# Patient Record
Sex: Female | Born: 1950 | Race: White | Hispanic: No | Smoking: Former smoker
Health system: Southern US, Community
[De-identification: ages and names within clinical notes are randomized; demographics above are authoritative.]

## PROBLEM LIST (undated history)

## (undated) DIAGNOSIS — I509 Heart failure, unspecified: Secondary | ICD-10-CM

## (undated) DIAGNOSIS — H269 Unspecified cataract: Secondary | ICD-10-CM

## (undated) DIAGNOSIS — E785 Hyperlipidemia, unspecified: Secondary | ICD-10-CM

## (undated) DIAGNOSIS — I10 Essential (primary) hypertension: Secondary | ICD-10-CM

## (undated) DIAGNOSIS — I2510 Atherosclerotic heart disease of native coronary artery without angina pectoris: Secondary | ICD-10-CM

## (undated) DIAGNOSIS — F329 Major depressive disorder, single episode, unspecified: Secondary | ICD-10-CM

## (undated) DIAGNOSIS — F32A Depression, unspecified: Secondary | ICD-10-CM

## (undated) DIAGNOSIS — I219 Acute myocardial infarction, unspecified: Secondary | ICD-10-CM

## (undated) DIAGNOSIS — F419 Anxiety disorder, unspecified: Secondary | ICD-10-CM

## (undated) DIAGNOSIS — M1990 Unspecified osteoarthritis, unspecified site: Secondary | ICD-10-CM

## (undated) HISTORY — PX: ABDOMINAL HYSTERECTOMY: SHX81

## (undated) HISTORY — DX: Major depressive disorder, single episode, unspecified: F32.9

## (undated) HISTORY — DX: Anxiety disorder, unspecified: F41.9

## (undated) HISTORY — DX: Depression, unspecified: F32.A

## (undated) HISTORY — PX: RIB FRACTURE SURGERY: SHX2358

## (undated) HISTORY — PX: FRACTURE SURGERY: SHX138

## (undated) HISTORY — DX: Hyperlipidemia, unspecified: E78.5

## (undated) HISTORY — DX: Essential (primary) hypertension: I10

## (undated) HISTORY — PX: CATARACT EXTRACTION, BILATERAL: SHX1313

## (undated) HISTORY — DX: Heart failure, unspecified: I50.9

## (undated) HISTORY — DX: Unspecified cataract: H26.9

---

## 1971-01-23 HISTORY — PX: BUNIONECTOMY: SHX129

## 1975-01-23 HISTORY — PX: WRIST FRACTURE SURGERY: SHX121

## 1975-01-23 HISTORY — PX: ELBOW SURGERY: SHX618

## 1990-01-22 HISTORY — PX: HAMMER TOE SURGERY: SHX385

## 2004-07-18 DIAGNOSIS — I252 Old myocardial infarction: Secondary | ICD-10-CM | POA: Insufficient documentation

## 2004-08-15 ENCOUNTER — Ambulatory Visit: Payer: Self-pay | Admitting: Podiatry

## 2004-09-28 ENCOUNTER — Ambulatory Visit: Payer: Self-pay | Admitting: Podiatry

## 2007-01-23 DIAGNOSIS — I219 Acute myocardial infarction, unspecified: Secondary | ICD-10-CM

## 2007-01-23 HISTORY — DX: Acute myocardial infarction, unspecified: I21.9

## 2007-01-23 HISTORY — PX: CORONARY STENT PLACEMENT: SHX1402

## 2007-07-27 ENCOUNTER — Inpatient Hospital Stay: Payer: Self-pay | Admitting: Internal Medicine

## 2007-07-27 ENCOUNTER — Other Ambulatory Visit: Payer: Self-pay

## 2007-07-28 ENCOUNTER — Other Ambulatory Visit: Payer: Self-pay

## 2009-02-04 DIAGNOSIS — I10 Essential (primary) hypertension: Secondary | ICD-10-CM

## 2013-09-10 LAB — HEPATIC FUNCTION PANEL
ALT: 14 U/L (ref 7–35)
AST: 19 U/L (ref 13–35)
Alkaline Phosphatase: 62 U/L (ref 25–125)
Bilirubin, Total: 0.2 mg/dL

## 2013-09-10 LAB — LIPID PANEL
CHOLESTEROL: 162 mg/dL (ref 0–200)
HDL: 64 mg/dL (ref 35–70)
LDL Cholesterol: 78 mg/dL
LDl/HDL Ratio: 1.2
Triglycerides: 102 mg/dL (ref 40–160)

## 2013-09-10 LAB — BASIC METABOLIC PANEL
BUN: 9 mg/dL (ref 4–21)
CREATININE: 0.9 mg/dL (ref 0.5–1.1)
Glucose: 121 mg/dL
Potassium: 4.8 mmol/L (ref 3.4–5.3)
Sodium: 139 mmol/L (ref 137–147)

## 2013-09-10 LAB — HEMOGLOBIN A1C: Hgb A1c MFr Bld: 5.9 % (ref 4.0–6.0)

## 2014-07-01 ENCOUNTER — Other Ambulatory Visit: Payer: Self-pay

## 2014-07-01 MED ORDER — BUPROPION HCL ER (XL) 300 MG PO TB24: 300.0000 mg | ORAL_TABLET | Freq: Every day | ORAL | Status: DC

## 2014-07-01 MED ORDER — CARVEDILOL 12.5 MG PO TABS: 12.5000 mg | ORAL_TABLET | Freq: Two times a day (BID) | ORAL | Status: DC

## 2014-07-19 ENCOUNTER — Encounter: Payer: Self-pay | Admitting: Family Medicine

## 2014-07-19 DIAGNOSIS — M545 Low back pain, unspecified: Secondary | ICD-10-CM | POA: Insufficient documentation

## 2014-07-19 DIAGNOSIS — E785 Hyperlipidemia, unspecified: Secondary | ICD-10-CM | POA: Insufficient documentation

## 2014-07-19 DIAGNOSIS — E8881 Metabolic syndrome: Secondary | ICD-10-CM | POA: Insufficient documentation

## 2014-07-19 DIAGNOSIS — F320 Major depressive disorder, single episode, mild: Secondary | ICD-10-CM | POA: Insufficient documentation

## 2014-07-19 DIAGNOSIS — I2510 Atherosclerotic heart disease of native coronary artery without angina pectoris: Secondary | ICD-10-CM | POA: Insufficient documentation

## 2014-07-19 DIAGNOSIS — G5601 Carpal tunnel syndrome, right upper limb: Secondary | ICD-10-CM | POA: Insufficient documentation

## 2014-07-19 DIAGNOSIS — K649 Unspecified hemorrhoids: Secondary | ICD-10-CM | POA: Insufficient documentation

## 2014-07-19 DIAGNOSIS — G8929 Other chronic pain: Secondary | ICD-10-CM | POA: Insufficient documentation

## 2014-07-19 DIAGNOSIS — G4700 Insomnia, unspecified: Secondary | ICD-10-CM | POA: Insufficient documentation

## 2014-07-19 DIAGNOSIS — M8580 Other specified disorders of bone density and structure, unspecified site: Secondary | ICD-10-CM | POA: Insufficient documentation

## 2014-07-19 DIAGNOSIS — J309 Allergic rhinitis, unspecified: Secondary | ICD-10-CM | POA: Insufficient documentation

## 2014-07-19 DIAGNOSIS — G4709 Other insomnia: Secondary | ICD-10-CM | POA: Insufficient documentation

## 2014-07-22 ENCOUNTER — Encounter: Payer: Self-pay | Admitting: Family Medicine

## 2014-08-09 ENCOUNTER — Encounter: Payer: Self-pay | Admitting: Family Medicine

## 2014-08-09 ENCOUNTER — Ambulatory Visit (INDEPENDENT_AMBULATORY_CARE_PROVIDER_SITE_OTHER): Payer: BLUE CROSS/BLUE SHIELD | Admitting: Family Medicine

## 2014-08-09 VITALS — BP 122/78 | HR 105 | Temp 98.4°F | Resp 14 | Ht 62.0 in | Wt 141.7 lb

## 2014-08-09 DIAGNOSIS — I2510 Atherosclerotic heart disease of native coronary artery without angina pectoris: Secondary | ICD-10-CM | POA: Diagnosis not present

## 2014-08-09 DIAGNOSIS — E785 Hyperlipidemia, unspecified: Secondary | ICD-10-CM | POA: Diagnosis not present

## 2014-08-09 DIAGNOSIS — F329 Major depressive disorder, single episode, unspecified: Secondary | ICD-10-CM | POA: Diagnosis not present

## 2014-08-09 DIAGNOSIS — Z1239 Encounter for other screening for malignant neoplasm of breast: Secondary | ICD-10-CM

## 2014-08-09 DIAGNOSIS — M545 Low back pain, unspecified: Secondary | ICD-10-CM

## 2014-08-09 DIAGNOSIS — I10 Essential (primary) hypertension: Secondary | ICD-10-CM | POA: Diagnosis not present

## 2014-08-09 DIAGNOSIS — G8929 Other chronic pain: Secondary | ICD-10-CM

## 2014-08-09 DIAGNOSIS — F32A Depression, unspecified: Secondary | ICD-10-CM

## 2014-08-09 MED ORDER — ROSUVASTATIN CALCIUM 40 MG PO TABS: 40.0000 mg | ORAL_TABLET | Freq: Every day | ORAL | Status: DC

## 2014-08-09 MED ORDER — LISINOPRIL 20 MG PO TABS: 20.0000 mg | ORAL_TABLET | Freq: Every day | ORAL | Status: DC

## 2014-08-09 MED ORDER — LISINOPRIL 20 MG PO TABS: 20.0000 mg | ORAL_TABLET | Freq: Two times a day (BID) | ORAL | Status: DC

## 2014-08-09 MED ORDER — CYCLOBENZAPRINE HCL 10 MG PO TABS: 10.0000 mg | ORAL_TABLET | Freq: Every evening | ORAL | Status: DC | PRN

## 2014-08-09 NOTE — Progress Notes (Signed)
Name: Elizabeth Johnson   MRN: [id number]    DOB: 05-Sep-1950   Date:08/09/2014       Progress Note  Subjective  Chief Complaint  Chief Complaint  Patient presents with  . Hypertension  . Hyperlipidemia  . Depression  . Anxiety    prn    HPI  HTN: she has ben taking Lisinopril and Coreg daily , she denies a cough or fatigue. She states for the past month she has only been taking Lisinopril once daily. BP at home has been at goal  Hyperlipidemia: taking Crestor daily and denies side effects  CAD: s/p stent placement for Acute MI back in 2006, she denies chest pain, palpitation, no decrease in exercise tolerance, no orthopnea.  Depression: she is not in remission, feels blah most of the time, taking Wellbutrin daily but does not want to change or add medications  Intermittent back pain: currently not daily, taking Flexeril prn . Pain is described as a aching sensation on lower back , seldom it radiates down left leg. It does not affect her ADL or ability to work, improves with rest and Flexeril  Patient Active Problem List   Diagnosis Date Noted  . Allergic rhinitis 07/19/2014  . Insomnia, persistent 07/19/2014  . Chronic LBP 07/19/2014  . Clinical depression 07/19/2014  . Dyslipidemia 07/19/2014  . Hemorrhoid 07/19/2014  . H/O acute myocardial infarction 07/19/2014  . Dysmetabolic syndrome 38/75/6433  . 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 Osteopenia 07/19/2014  . Benign essential HTN 02/04/2009    Past Surgical History  Procedure Laterality Date  . Coronary stent placement  2009  . Bunionectomy Bilateral   . Elbow surgery Left   . Wrist fracture surgery    . Abdominal hysterectomy    . Rib fracture surgery    . Hammer toe surgery      Family History  Problem Relation Age of Onset  . CAD Father   . Heart attack Father   . Diabetes Sister   . Heart attack Brother   . Alzheimer's disease Mother   . Breast cancer Mother      History   Social History  . Marital Status: Married    Spouse Name: N/A  . Number of Children: 1  . Years of Education: N/A   Occupational History  . Supervisor    Social History Main Topics  . Smoking status: Former Smoker    Types: Cigarettes    Quit date: 05/23/2007  . Smokeless tobacco: Never Used  . Alcohol Use: 0.0 oz/week    0 Standard drinks or equivalent per week     Comment: occasional  . Drug Use: No  . Sexual Activity: Yes   Other Topics Concern  . Not on file   Social History Narrative     Current outpatient prescriptions:  .  ALPRAZolam (XANAX) 0.5 MG tablet, Take 1 tablet by mouth as needed., Disp: , Rfl:  .  aspirin 81 MG chewable tablet, Chew 1 tablet by mouth daily., Disp: , Rfl:  .  buPROPion (WELLBUTRIN XL) 300 MG 24 hr tablet, Take 1 tablet (300 mg total) by mouth daily., Disp: 90 tablet, Rfl: 0 .  carvedilol (COREG) 12.5 MG tablet, Take 1 tablet (12.5 mg total) by mouth 2 (two) times daily with a meal., Disp: 180 tablet, Rfl: 0 .  cyclobenzaprine (FLEXERIL) 10 MG tablet, Take 1 tablet (10 mg total) by mouth at bedtime as needed., Disp: 90 tablet, Rfl: 1 .  lisinopril (PRINIVIL,ZESTRIL) 20 MG tablet, Take 1 tablet (20 mg total) by mouth daily., Disp: 90 tablet, Rfl: 1 .  rosuvastatin (CRESTOR) 40 MG tablet, Take 1 tablet (40 mg total) by mouth daily., Disp: 90 tablet, Rfl: 1  Allergies  Allergen Reactions  . Codeine      ROS  Constitutional: Negative for fever or weight change.  Respiratory: Negative for cough and shortness of breath.   Cardiovascular: Negative for chest pain or palpitations.  Gastrointestinal: Negative for abdominal pain, no bowel changes.  Musculoskeletal: Negative for gait problem or joint swelling.  Skin: Negative for rash.  Neurological: Negative for dizziness or headache.  No other specific complaints in a complete review of systems (except as listed in HPI above).  Objective  Filed Vitals:   08/09/14 1535   BP: 122/78  Pulse: 105  Temp: 98.4 F (36.9 C)  TempSrc: Oral  Resp: 14  Height: 5\' 2"  (1.575 m)  Weight: 141 lb 11.2 oz (64.275 kg)  SpO2: 96%    Body mass index is 25.91 kg/(m^2).  Physical Exam  Constitutional: Patient appears well-developed and well-nourished. No distress.  HENT: Head: Normocephalic and atraumatic. Nose: Nose normal. Mouth/Throat: Oropharynx is clear and moist. No oropharyngeal exudate.  Eyes: Conjunctivae and EOM are normal. Pupils are equal, round, and reactive to light. No scleral icterus.  Neck: Normal range of motion. Neck supple. No JVD present. No thyromegaly present.  Cardiovascular: Normal rate, regular rhythm and normal heart sounds.  No murmur heard. No BLE edema. Pulmonary/Chest: Effort normal and breath sounds normal. No respiratory distress. Abdominal: Soft. Bowel sounds are normal, no distension. There is no tenderness. no masses Musculoskeletal: Normal range of motion, no joint effusions. No gross deformities Neurological: he is alert and oriented to person, place, and time. No cranial nerve deficit. Coordination, balance, strength, speech and gait are normal.  Skin: Skin is warm and dry. No rash noted. No erythema.  Psychiatric: Patient has a normal mood and affect. behavior is normal. Judgment and thought content normal.   PHQ2/9: Depression screen PHQ 2/9 08/09/2014  Decreased Interest 0  Down, Depressed, Hopeless 0  PHQ - 2 Score 0     Fall Risk: Fall Risk  08/09/2014  Falls in the past year? No     Assessment & Plan  1. Benign essential HTN  At goal, continue current medication  - Comprehensive Metabolic Panel (CMET) - lisinopril (PRINIVIL,ZESTRIL) 20 MG tablet; Take 1 tablet (20 mg total) by mouth daily.  Dispense: 90 tablet; Refill: 1  2. Coronary artery disease involving left main coronary artery  Doing well on statin, beta-blocker , ace and aspirin daily, no symptoms  3. Dyslipidemia  - rosuvastatin (CRESTOR) 40 MG  tablet; Take 1 tablet (40 mg total) by mouth daily.  Dispense: 90 tablet; Refill: 1 - Lipid Profile  4. Clinical depression  She still has medication at home  5. Breast cancer screening  - MM Digital Screening; Future  6. Chronic LBP Continue medication - cyclobenzaprine (FLEXERIL) 10 MG tablet; Take 1 tablet (10 mg total) by mouth at bedtime as needed.  Dispense: 90 tablet; Refill: 1

## 2014-08-27 LAB — COMPREHENSIVE METABOLIC PANEL
A/G RATIO: 1.7 (ref 1.1–2.5)
ALK PHOS: 54 IU/L (ref 39–117)
ALT: 15 IU/L (ref 0–32)
AST: 20 IU/L (ref 0–40)
Albumin: 4.3 g/dL (ref 3.6–4.8)
BILIRUBIN TOTAL: 0.3 mg/dL (ref 0.0–1.2)
BUN/Creatinine Ratio: 15 (ref 11–26)
BUN: 12 mg/dL (ref 8–27)
CALCIUM: 9.1 mg/dL (ref 8.7–10.3)
CHLORIDE: 101 mmol/L (ref 97–108)
CO2: 23 mmol/L (ref 18–29)
Creatinine, Ser: 0.81 mg/dL (ref 0.57–1.00)
GFR calc non Af Amer: 78 mL/min/{1.73_m2} (ref >59)
GFR, EST AFRICAN AMERICAN: 90 mL/min/{1.73_m2} (ref >59)
Globulin, Total: 2.5 g/dL (ref 1.5–4.5)
Glucose: 128 mg/dL — ABNORMAL HIGH (ref 65–99)
Potassium: 4.7 mmol/L (ref 3.5–5.2)
Sodium: 141 mmol/L (ref 134–144)
Total Protein: 6.8 g/dL (ref 6.0–8.5)

## 2014-08-27 LAB — LIPID PANEL
CHOLESTEROL TOTAL: 170 mg/dL (ref 100–199)
Chol/HDL Ratio: 2.6 ratio units (ref 0.0–4.4)
HDL: 66 mg/dL (ref >39)
LDL Calculated: 83 mg/dL (ref 0–99)
TRIGLYCERIDES: 106 mg/dL (ref 0–149)
VLDL CHOLESTEROL CAL: 21 mg/dL (ref 5–40)

## 2014-08-30 ENCOUNTER — Telehealth: Payer: Self-pay

## 2014-08-30 NOTE — Telephone Encounter (Signed)
Left vm for patient to return my call regarding labs.   

## 2014-08-30 NOTE — Telephone Encounter (Signed)
-----   Message from Steele Sizer, MD sent at 08/27/2014  6:00 PM EDT ----- Continue statin therapy Glucose is elevated, please add hgbA1C if possible , otherwise repeat fasting on her next visit Normal kidney and liver function Please notify patient, thank you

## 2014-09-02 ENCOUNTER — Ambulatory Visit (INDEPENDENT_AMBULATORY_CARE_PROVIDER_SITE_OTHER): Payer: BLUE CROSS/BLUE SHIELD | Admitting: Family Medicine

## 2014-09-02 ENCOUNTER — Encounter: Payer: Self-pay | Admitting: Family Medicine

## 2014-09-02 VITALS — BP 122/72 | HR 95 | Temp 98.9°F | Resp 16 | Ht 62.0 in | Wt 141.7 lb

## 2014-09-02 DIAGNOSIS — M2041 Other hammer toe(s) (acquired), right foot: Secondary | ICD-10-CM | POA: Diagnosis not present

## 2014-09-02 DIAGNOSIS — K648 Other hemorrhoids: Secondary | ICD-10-CM | POA: Diagnosis not present

## 2014-09-02 DIAGNOSIS — Z719 Counseling, unspecified: Secondary | ICD-10-CM

## 2014-09-02 DIAGNOSIS — Z0000 Encounter for general adult medical examination without abnormal findings: Secondary | ICD-10-CM

## 2014-09-02 DIAGNOSIS — K644 Residual hemorrhoidal skin tags: Secondary | ICD-10-CM | POA: Insufficient documentation

## 2014-09-02 DIAGNOSIS — R739 Hyperglycemia, unspecified: Secondary | ICD-10-CM

## 2014-09-02 DIAGNOSIS — Z7189 Other specified counseling: Secondary | ICD-10-CM

## 2014-09-02 DIAGNOSIS — M2042 Other hammer toe(s) (acquired), left foot: Secondary | ICD-10-CM

## 2014-09-02 DIAGNOSIS — Z1239 Encounter for other screening for malignant neoplasm of breast: Secondary | ICD-10-CM | POA: Diagnosis not present

## 2014-09-02 DIAGNOSIS — Z1211 Encounter for screening for malignant neoplasm of colon: Secondary | ICD-10-CM

## 2014-09-02 DIAGNOSIS — Z90710 Acquired absence of both cervix and uterus: Secondary | ICD-10-CM

## 2014-09-02 DIAGNOSIS — M8580 Other specified disorders of bone density and structure, unspecified site: Secondary | ICD-10-CM

## 2014-09-02 DIAGNOSIS — Z01419 Encounter for gynecological examination (general) (routine) without abnormal findings: Secondary | ICD-10-CM

## 2014-09-02 LAB — POCT GLYCOSYLATED HEMOGLOBIN (HGB A1C): Hemoglobin A1C: 5.8

## 2014-09-02 NOTE — Progress Notes (Signed)
Name: Elizabeth Johnson   MRN: [id number]    DOB: 1950-11-18   Date:09/02/2014       Progress Note  Subjective  Chief Complaint  Chief Complaint  Patient presents with  . Annual Exam    HPI  Well Woman: she denies urinary frequency or urgency, refuses colonoscopy, no need for pap smear - s/p hysterectomy. She has occasional hot flashes.   Hyperglycemia: glucose 128 on her last labs, we will check hgbA1C, denies polyphagia, polydipsia or polyuria.  Patient Active Problem List   Diagnosis Date Noted  . H/O hysterectomy for benign disease 09/02/2014  . Allergic rhinitis 07/19/2014  . Insomnia, persistent 07/19/2014  . Chronic LBP 07/19/2014  . Clinical depression 07/19/2014  . Dyslipidemia 07/19/2014  . Hemorrhoid 07/19/2014  . Dysmetabolic syndrome 84/69/6295  . 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 Osteopenia 07/19/2014  . Benign essential HTN 02/04/2009  . H/O acute myocardial infarction of lateral wall 07/18/2004    Past Surgical History  Procedure Laterality Date  . Coronary stent placement  2009  . Bunionectomy Bilateral   . Elbow surgery Left   . Wrist fracture surgery    . Abdominal hysterectomy    . Rib fracture surgery    . Hammer toe surgery      Family History  Problem Relation Age of Onset  . CAD Father   . Heart attack Father   . Diabetes Sister   . Heart attack Brother   . Alzheimer's disease Mother   . Breast cancer Mother     Social History   Social History  . Marital Status: Married    Spouse Name: N/A  . Number of Children: 1  . Years of Education: N/A   Occupational History  . Supervisor    Social History Main Topics  . Smoking status: Former Smoker    Types: Cigarettes    Quit date: 05/23/2007  . Smokeless tobacco: Never Used  . Alcohol Use: 0.0 oz/week    0 Standard drinks or equivalent per week     Comment: occasional  . Drug Use: No  . Sexual Activity: Yes   Other Topics Concern  . Not  on file   Social History Narrative     Current outpatient prescriptions:  .  ALPRAZolam (XANAX) 0.5 MG tablet, Take 1 tablet by mouth as needed., Disp: , Rfl:  .  aspirin 81 MG chewable tablet, Chew 1 tablet by mouth daily., Disp: , Rfl:  .  buPROPion (WELLBUTRIN XL) 300 MG 24 hr tablet, Take 1 tablet (300 mg total) by mouth daily., Disp: 90 tablet, Rfl: 0 .  carvedilol (COREG) 12.5 MG tablet, Take 1 tablet (12.5 mg total) by mouth 2 (two) times daily with a meal., Disp: 180 tablet, Rfl: 0 .  cyclobenzaprine (FLEXERIL) 10 MG tablet, Take 1 tablet (10 mg total) by mouth at bedtime as needed., Disp: 90 tablet, Rfl: 1 .  lisinopril (PRINIVIL,ZESTRIL) 20 MG tablet, Take 1 tablet (20 mg total) by mouth daily., Disp: 90 tablet, Rfl: 1 .  rosuvastatin (CRESTOR) 40 MG tablet, Take 1 tablet (40 mg total) by mouth daily., Disp: 90 tablet, Rfl: 1  Allergies  Allergen Reactions  . Codeine      ROS  Constitutional: Negative for fever or weight change.  Respiratory: Negative for cough and shortness of breath.   Cardiovascular: Negative for chest pain or palpitations.  Gastrointestinal: Negative for abdominal pain, no bowel changes.  Musculoskeletal: Negative for gait problem  or joint swelling.  Skin: Negative for rash.  Neurological: Negative for dizziness or headache.  No other specific complaints in a complete review of systems (except as listed in HPI above).  Objective  Filed Vitals:   09/02/14 1425  BP: 122/72  Pulse: 95  Temp: 98.9 F (37.2 C)  TempSrc: Oral  Resp: 16  Height: 5\' 2"  (1.575 m)  Weight: 141 lb 11.2 oz (64.275 kg)  SpO2: 94%    Body mass index is 25.91 kg/(m^2).  Physical Exam  Constitutional: Patient appears well-developed and well-nourished. No distress.  HENT: Head: Normocephalic and atraumatic. Ears: B TMs ok, no erythema or effusion; Nose: Nose normal. Mouth/Throat: Oropharynx is clear and moist. No oropharyngeal exudate.  Eyes: Conjunctivae and EOM are  normal. Pupils are equal, round, and reactive to light. No scleral icterus.  Neck: Normal range of motion. Neck supple. No JVD present. No thyromegaly present.  Cardiovascular: Normal rate, regular rhythm and normal heart sounds.  No murmur heard. No BLE edema. Pulmonary/Chest: Effort normal and breath sounds normal. No respiratory distress. Abdominal: Soft. Bowel sounds are normal, no distension. There is no tenderness. no masses Breast: no lumps or masses, no nipple discharge or rashes FEMALE GENITALIA:  External genitalia normal External urethra normal RECTAL: external hemorrhoids Musculoskeletal: Normal range of motion, no joint effusions. Hammer toes both feet Neurological: he is alert and oriented to person, place, and time. No cranial nerve deficit. Coordination, balance, strength, speech and gait are normal.  Skin: Skin is warm and dry. No rash noted. No erythema.  Psychiatric: Patient has a normal mood and affect. behavior is normal. Judgment and thought content normal.  Recent Results (from the past 2160 hour(s))  Lipid Profile     Status: None   Collection Time: 08/26/14  8:17 AM  Result Value Ref Range   Cholesterol, Total 170 100 - 199 mg/dL   Triglycerides 106 0 - 149 mg/dL   HDL 66 >39 mg/dL    Comment: According to ATP-III Guidelines, HDL-C >59 mg/dL is considered a negative risk factor for CHD.    VLDL Cholesterol Cal 21 5 - 40 mg/dL   LDL Calculated 83 0 - 99 mg/dL   Chol/HDL Ratio 2.6 0.0 - 4.4 ratio units    Comment:                                   T. Chol/HDL Ratio                                             Men  Women                               1/2 Avg.Risk  3.4    3.3                                   Avg.Risk  5.0    4.4                                2X Avg.Risk  9.6    7.1  3X Avg.Risk 23.4   11.0   Comprehensive Metabolic Panel (CMET)     Status: Abnormal   Collection Time: 08/26/14  8:17 AM  Result Value Ref Range    Glucose 128 (H) 65 - 99 mg/dL   BUN 12 8 - 27 mg/dL   Creatinine, Ser 0.81 0.57 - 1.00 mg/dL   GFR calc non Af Amer 78 >59 mL/min/1.73   GFR calc Af Amer 90 >59 mL/min/1.73   BUN/Creatinine Ratio 15 11 - 26   Sodium 141 134 - 144 mmol/L   Potassium 4.7 3.5 - 5.2 mmol/L   Chloride 101 97 - 108 mmol/L   CO2 23 18 - 29 mmol/L   Calcium 9.1 8.7 - 10.3 mg/dL   Total Protein 6.8 6.0 - 8.5 g/dL   Albumin 4.3 3.6 - 4.8 g/dL   Globulin, Total 2.5 1.5 - 4.5 g/dL   Albumin/Globulin Ratio 1.7 1.1 - 2.5   Bilirubin Total 0.3 0.0 - 1.2 mg/dL   Alkaline Phosphatase 54 39 - 117 IU/L   AST 20 0 - 40 IU/L   ALT 15 0 - 32 IU/L     PHQ2/9: Depression screen PHQ 2/9 08/09/2014  Decreased Interest 0  Down, Depressed, Hopeless 0  PHQ - 2 Score 0    Fall Risk: Fall Risk  08/09/2014  Falls in the past year? No     Assessment & Plan  1. Well woman exam   2. H/O hysterectomy for benign disease No need for pap   3. Colon cancer screening Refuses colonoscopy , insurance does not cover Cologuard , we will give her hemoccult cards -3 hemoccult cards 4. Breast cancer screening Due for repeat, scheduled in July, she has not been notified of appointment yet  5. Health counseling Discussed importance of 150 minutes of physical activity weekly, eat two servings of fish weekly, eat one serving of tree nuts ( cashews, pistachios, pecans, almonds.Marland Kitchen) every other day, eat 6 servings of fruit/vegetables daily and drink plenty of water and avoid sweet beverages.   6. Osteopenia She refused medication in the past, and does not want to repeat a bone density at this time  7. Hyperglycemia  - POCT HgB A1C

## 2014-09-21 ENCOUNTER — Other Ambulatory Visit: Payer: Self-pay

## 2014-09-21 MED ORDER — BUPROPION HCL ER (XL) 300 MG PO TB24: 300.0000 mg | ORAL_TABLET | Freq: Every day | ORAL | Status: DC

## 2014-09-21 NOTE — Telephone Encounter (Signed)
Patient requesting refill. 

## 2014-09-28 ENCOUNTER — Other Ambulatory Visit: Payer: Self-pay

## 2014-09-28 MED ORDER — CARVEDILOL 12.5 MG PO TABS: 12.5000 mg | ORAL_TABLET | Freq: Two times a day (BID) | ORAL | Status: DC

## 2015-01-06 ENCOUNTER — Encounter: Payer: Self-pay | Admitting: Family Medicine

## 2015-01-06 ENCOUNTER — Ambulatory Visit (INDEPENDENT_AMBULATORY_CARE_PROVIDER_SITE_OTHER): Payer: BLUE CROSS/BLUE SHIELD | Admitting: Family Medicine

## 2015-01-06 VITALS — BP 116/60 | HR 113 | Temp 97.9°F | Resp 14 | Ht 62.0 in | Wt 145.4 lb

## 2015-01-06 DIAGNOSIS — I2510 Atherosclerotic heart disease of native coronary artery without angina pectoris: Secondary | ICD-10-CM | POA: Diagnosis not present

## 2015-01-06 DIAGNOSIS — J069 Acute upper respiratory infection, unspecified: Secondary | ICD-10-CM

## 2015-01-06 DIAGNOSIS — E785 Hyperlipidemia, unspecified: Secondary | ICD-10-CM | POA: Diagnosis not present

## 2015-01-06 DIAGNOSIS — I10 Essential (primary) hypertension: Secondary | ICD-10-CM | POA: Diagnosis not present

## 2015-01-06 DIAGNOSIS — G4700 Insomnia, unspecified: Secondary | ICD-10-CM

## 2015-01-06 DIAGNOSIS — F320 Major depressive disorder, single episode, mild: Secondary | ICD-10-CM | POA: Diagnosis not present

## 2015-01-06 DIAGNOSIS — Z23 Encounter for immunization: Secondary | ICD-10-CM

## 2015-01-06 MED ORDER — LISINOPRIL 20 MG PO TABS
20.0000 mg | ORAL_TABLET | Freq: Every day | ORAL | Status: DC
Start: 2015-01-06 — End: 2015-07-11

## 2015-01-06 MED ORDER — PREDNISONE 20 MG PO TABS: 20.0000 mg | ORAL_TABLET | Freq: Every day | ORAL | Status: DC

## 2015-01-06 MED ORDER — ALPRAZOLAM 0.5 MG PO TABS: 0.5000 mg | ORAL_TABLET | ORAL | Status: DC | PRN

## 2015-01-06 MED ORDER — BENZONATATE 100 MG PO CAPS: 100.0000 mg | ORAL_CAPSULE | Freq: Three times a day (TID) | ORAL | Status: DC | PRN

## 2015-01-06 MED ORDER — CARVEDILOL 12.5 MG PO TABS: 12.5000 mg | ORAL_TABLET | Freq: Two times a day (BID) | ORAL | Status: DC

## 2015-01-06 MED ORDER — ROSUVASTATIN CALCIUM 40 MG PO TABS: 40.0000 mg | ORAL_TABLET | Freq: Every day | ORAL | Status: DC

## 2015-01-06 NOTE — Progress Notes (Signed)
Name: Elizabeth Johnson   MRN: [id number]    DOB: [birth year]   Date:01/06/2015       Progress Note  Subjective  Chief Complaint  Chief Complaint  Patient presents with  . Medication Refill    follow-up  . Hypertension  . Hyperlipidemia  . Depression  . URI    onset 2 month off and on.  Symptoms include: headache, sore throat, cough, hoarsness    HPI  HTN: taking lisinopril and carvedilol daily , bp is well controlled today, she states she has not been checking bp at home. Denies chest pain, SOB or palpitation  CAD: she has a history of a MI, s/p stent placement, doing well on aspirin, beta-blocker, ace and crestor. No symptoms of chest pain, decrease in exercise tolerance.   Hyperlipidemia: taking Crestor, denies side effects of medication, last labs done in August and at goal  Depression Mild in Remission: father is in assistant living facility and makes her more rested knowing that he is in a good facility [place]. Taking medication for years and is doing well. No anhedonia, no crying spells - except when she misses one of her dogs that died this past Spring.   URI: she states she had two episodes of bronchitis over the past four months and had to be out of work for one week each time. She started having same symptoms, with nasal congestion, post-nasal drainage and dry cough. Usually gets hoarse before symptoms gets worse. Discussed likely viral and advised cough medication and fill rx of prednisone when she gets hoarse and call back if it does not work. She was a smoker, quit smoking many years ago.    Patient Active Problem List   Diagnosis Date Noted  . H/O hysterectomy for benign disease 09/02/2014  . Acquired bilateral hammer toes 09/02/2014  . External hemorrhoid 09/02/2014  . Allergic rhinitis 07/19/2014  . Insomnia, persistent 07/19/2014  . Chronic LBP 07/19/2014  . Mild major depression ([place]) 07/19/2014  . Dyslipidemia 07/19/2014  .  Hemorrhoid 07/19/2014  . Dysmetabolic syndrome 0000000  . 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 Osteopenia 07/19/2014  . Benign essential HTN 02/04/2009  . H/O acute myocardial infarction of lateral wall 07/18/2004    Past Surgical History  Procedure Laterality Date  . Coronary stent placement  2009  . Bunionectomy Bilateral   . Elbow surgery Left   . Wrist fracture surgery    . Abdominal hysterectomy    . Rib fracture surgery    . Hammer toe surgery      Family History  Problem Relation Age of Onset  . CAD Father   . Heart attack Father   . Diabetes Sister   . Heart attack Brother   . Alzheimer's disease Mother   . Breast cancer Mother     Social History   Social History  . Marital Status: Married    Spouse Name: N/A  . Number of Children: 1  . Years of Education: N/A   Occupational History  . Supervisor    Social History Main Topics  . Smoking status: Former Smoker    Types: Cigarettes    Quit date: 05/23/2007  . Smokeless tobacco: Never Used  . Alcohol Use: 0.0 oz/week    0 Standard drinks or equivalent per week     Comment: occasional  . Drug Use: No  . Sexual Activity: Yes   Other Topics Concern  . Not  on file   Social History Narrative     Current outpatient prescriptions:  .  ALPRAZolam (XANAX) 0.5 MG tablet, Take 1 tablet (0.5 mg total) by mouth as needed., Disp: 30 tablet, Rfl: 0 .  aspirin 81 MG chewable tablet, Chew 1 tablet by mouth daily., Disp: , Rfl:  .  buPROPion (WELLBUTRIN XL) 300 MG 24 hr tablet, Take 1 tablet (300 mg total) by mouth daily., Disp: 90 tablet, Rfl: 1 .  carvedilol (COREG) 12.5 MG tablet, Take 1 tablet (12.5 mg total) by mouth 2 (two) times daily with a meal., Disp: 180 tablet, Rfl: 1 .  cyclobenzaprine (FLEXERIL) 10 MG tablet, Take 1 tablet (10 mg total) by mouth at bedtime as needed., Disp: 90 tablet, Rfl: 1 .  lisinopril (PRINIVIL,ZESTRIL) 20 MG tablet, Take 1 tablet (20 mg  total) by mouth daily., Disp: 90 tablet, Rfl: 1 .  rosuvastatin (CRESTOR) 40 MG tablet, Take 1 tablet (40 mg total) by mouth daily., Disp: 90 tablet, Rfl: 1  Allergies  Allergen Reactions  . Codeine      ROS  Constitutional: Negative for fever or significant weight change.  Respiratory: Positive for cough, but negative  shortness of breath.   Cardiovascular: Negative for chest pain or palpitations.  Gastrointestinal: Negative for abdominal pain, no bowel changes.  Musculoskeletal: Negative for gait problem or joint swelling.  Skin: Negative for rash.  Neurological: Negative for dizziness , positive for frontal headache.  No other specific complaints in a complete review of systems (except as listed in HPI above).  Objective  Filed Vitals:   01/06/15 1401  BP: 116/60  Pulse: 113  Temp: 97.9 F (36.6 C)  TempSrc: Oral  Resp: 14  Height: 5\' 2"  (1.575 m)  Weight: 145 lb 6.4 oz (65.953 kg)  SpO2: 95%    Body mass index is 26.59 kg/(m^2).  Physical Exam  Constitutional: Patient appears well-developed and well-nourished. Obese No distress.  HEENT: head atraumatic, normocephalic, pupils equal and reactive to light, ears TMneck supple, throat within normal limits Cardiovascular: Normal rate, regular rhythm and normal heart sounds.  No murmur heard. No BLE edema. Pulmonary/Chest: Effort normal and breath sounds normal. No respiratory distress. Abdominal: Soft.  There is no tenderness. Psychiatric: Patient has a normal mood and affect. behavior is normal. Judgment and thought content normal.  PHQ2/9: Depression screen [facility] 2/9 01/06/2015 08/09/2014  Decreased Interest 0 0  Down, Depressed, Hopeless 0 0  PHQ - 2 Score 0 0    Fall Risk: Fall Risk  01/06/2015 08/09/2014  Falls in the past year? No No     Functional Status Survey: Is the patient deaf or have difficulty hearing?: No Does the patient have difficulty seeing, even when wearing glasses/contacts?: No Does the  patient have difficulty concentrating, remembering, or making decisions?: No Does the patient have difficulty walking or climbing stairs?: No Does the patient have difficulty dressing or bathing?: No Does the patient have difficulty doing errands alone such as visiting a doctor's office or shopping?: No    Assessment & Plan  1. Benign essential HTN  - carvedilol (COREG) 12.5 MG tablet; Take 1 tablet (12.5 mg total) by mouth 2 (two) times daily with a meal.  Dispense: 180 tablet; Refill: 1 - lisinopril (PRINIVIL,ZESTRIL) 20 MG tablet; Take 1 tablet (20 mg total) by mouth daily.  Dispense: 90 tablet; Refill: 1  2. Needs flu shot  - Flu Vaccine QUAD 36+ mos PF IM (Fluarix & Fluzone Quad PF)  3. Coronary artery  disease involving left main coronary artery  Continue current managenent  4. Mild major depression (HCC)  Doing well on medication   5. Insomnia  Taking medication prn  - ALPRAZolam (XANAX) 0.5 MG tablet; Take 1 tablet (0.5 mg total) by mouth as needed.  Dispense: 30 tablet; Refill: 0  6. Dyslipidemia  - rosuvastatin (CRESTOR) 40 MG tablet; Take 1 tablet (40 mg total) by mouth daily.  Dispense: 90 tablet; Refill: 1  7. Upper respiratory infection  - predniSONE (DELTASONE) 20 MG tablet; Take 1 tablet (20 mg total) by mouth daily with breakfast.  Dispense: 10 tablet; Refill: 0 - benzonatate (TESSALON PERLES) 100 MG capsule; Take 1-2 capsules (100-200 mg total) by mouth 3 (three) times daily as needed for cough.  Dispense: 40 capsule; Refill: 0

## 2015-03-28 ENCOUNTER — Other Ambulatory Visit: Payer: Self-pay

## 2015-03-28 MED ORDER — BUPROPION HCL ER (XL) 300 MG PO TB24: 300.0000 mg | ORAL_TABLET | Freq: Every day | ORAL | Status: DC

## 2015-03-28 NOTE — Telephone Encounter (Signed)
Refill request was sent to Dr. Krichna Sowles for approval and submission.  

## 2015-07-11 ENCOUNTER — Other Ambulatory Visit: Payer: Self-pay

## 2015-07-11 DIAGNOSIS — I10 Essential (primary) hypertension: Secondary | ICD-10-CM

## 2015-07-11 MED ORDER — LISINOPRIL 20 MG PO TABS: 20.0000 mg | ORAL_TABLET | Freq: Every day | ORAL | Status: DC

## 2015-07-11 NOTE — Telephone Encounter (Signed)
Patient requesting refill. 

## 2015-09-08 ENCOUNTER — Encounter: Payer: BLUE CROSS/BLUE SHIELD | Admitting: Family Medicine

## 2015-09-20 ENCOUNTER — Other Ambulatory Visit: Payer: Self-pay | Admitting: Family Medicine

## 2015-09-20 ENCOUNTER — Other Ambulatory Visit: Payer: Self-pay

## 2015-09-20 DIAGNOSIS — M545 Low back pain, unspecified: Secondary | ICD-10-CM

## 2015-09-20 DIAGNOSIS — G8929 Other chronic pain: Secondary | ICD-10-CM

## 2015-09-20 NOTE — Telephone Encounter (Signed)
Patient requesting refill of Flexeril.

## 2015-09-21 ENCOUNTER — Ambulatory Visit (INDEPENDENT_AMBULATORY_CARE_PROVIDER_SITE_OTHER): Payer: BLUE CROSS/BLUE SHIELD | Admitting: Family Medicine

## 2015-09-21 ENCOUNTER — Encounter: Payer: Self-pay | Admitting: Family Medicine

## 2015-09-21 VITALS — BP 126/74 | HR 110 | Temp 98.2°F | Resp 16 | Ht 62.0 in | Wt 142.1 lb

## 2015-09-21 DIAGNOSIS — Z23 Encounter for immunization: Secondary | ICD-10-CM

## 2015-09-21 DIAGNOSIS — Z01419 Encounter for gynecological examination (general) (routine) without abnormal findings: Secondary | ICD-10-CM

## 2015-09-21 DIAGNOSIS — R739 Hyperglycemia, unspecified: Secondary | ICD-10-CM | POA: Diagnosis not present

## 2015-09-21 DIAGNOSIS — Z1211 Encounter for screening for malignant neoplasm of colon: Secondary | ICD-10-CM | POA: Diagnosis not present

## 2015-09-21 DIAGNOSIS — Z0000 Encounter for general adult medical examination without abnormal findings: Secondary | ICD-10-CM | POA: Diagnosis not present

## 2015-09-21 DIAGNOSIS — M545 Low back pain, unspecified: Secondary | ICD-10-CM

## 2015-09-21 DIAGNOSIS — S46111S Strain of muscle, fascia and tendon of long head of biceps, right arm, sequela: Secondary | ICD-10-CM | POA: Diagnosis not present

## 2015-09-21 DIAGNOSIS — E785 Hyperlipidemia, unspecified: Secondary | ICD-10-CM | POA: Diagnosis not present

## 2015-09-21 DIAGNOSIS — G4700 Insomnia, unspecified: Secondary | ICD-10-CM

## 2015-09-21 DIAGNOSIS — I10 Essential (primary) hypertension: Secondary | ICD-10-CM

## 2015-09-21 DIAGNOSIS — F320 Major depressive disorder, single episode, mild: Secondary | ICD-10-CM

## 2015-09-21 DIAGNOSIS — I2510 Atherosclerotic heart disease of native coronary artery without angina pectoris: Secondary | ICD-10-CM

## 2015-09-21 DIAGNOSIS — G8929 Other chronic pain: Secondary | ICD-10-CM | POA: Diagnosis not present

## 2015-09-21 DIAGNOSIS — S46219A Strain of muscle, fascia and tendon of other parts of biceps, unspecified arm, initial encounter: Secondary | ICD-10-CM | POA: Insufficient documentation

## 2015-09-21 DIAGNOSIS — S46211S Strain of muscle, fascia and tendon of other parts of biceps, right arm, sequela: Secondary | ICD-10-CM

## 2015-09-21 MED ORDER — PRAVASTATIN SODIUM 40 MG PO TABS: 40.0000 mg | ORAL_TABLET | Freq: Every day | ORAL | 1 refills | Status: DC

## 2015-09-21 MED ORDER — LISINOPRIL 20 MG PO TABS: 20.0000 mg | ORAL_TABLET | Freq: Every day | ORAL | 1 refills | Status: DC

## 2015-09-21 MED ORDER — CYCLOBENZAPRINE HCL 10 MG PO TABS: 10.0000 mg | ORAL_TABLET | Freq: Every evening | ORAL | 1 refills | Status: DC | PRN

## 2015-09-21 MED ORDER — ALPRAZOLAM 0.5 MG PO TABS: 0.5000 mg | ORAL_TABLET | ORAL | 0 refills | Status: DC | PRN

## 2015-09-21 MED ORDER — BUPROPION HCL ER (XL) 300 MG PO TB24: 300.0000 mg | ORAL_TABLET | Freq: Every day | ORAL | 1 refills | Status: DC

## 2015-09-21 MED ORDER — CARVEDILOL 12.5 MG PO TABS: 12.5000 mg | ORAL_TABLET | Freq: Two times a day (BID) | ORAL | 1 refills | Status: DC

## 2015-09-21 NOTE — Progress Notes (Signed)
Name: Elizabeth Johnson   MRN: [id number]    DOB: [birth year]   Date:09/21/2015       Progress Note  Subjective  Chief Complaint  Chief Complaint  Patient presents with  . Annual Exam  . Medication Refill    6 month F/U  . Hyperlipidemia    Patient stop taking Crestor a couple of months ago due to side effects and has felt much better since then.   . Hypertension  . Depression    Controlled with medication  . Back Pain    Takes Cyclobenzaprine every other day for relief and does well with this medication    HPI  Well Woman: s/p hysterectomy - for DUB. She is doing well, still sexually active and no pain during intercourse, she denies urinary incontinence.  Hyperlipidemia: she stopped taking crestor because of muscle pain and feels better, however explained to her that she has CAD and needs to resume statin therapy, we will try Pravastatin because of cost - she will lose her insurance soon.   HTN: patient is doing well, no chest pain , palpitation or SOB.   Major Depression: she has been working for the same company for years, but the company will relocate to [place] in one month and she will no longer have a job. She is worried about it. She denies crying spells or lack of energy. Taking Wellbutrin daily and symptoms of depression have been controlled  Back pain: she has mild low back pain, she states since she stopped Crestor pain has decreased, takes Flexeril qhs prn only. Described as spasms, no radiculitis  Right tendon rupture: she was at work a few months ago and picked a heavy object with right hand and she felt like something popped in her arm, she went to Dr. Marcellina Millin about one week later and had a normal [place], however since than she has a lump on right biceps area. Like Popeye.   Patient Active Problem List   Diagnosis Date Noted  . H/O hysterectomy for benign disease 09/02/2014  . Acquired bilateral hammer toes 09/02/2014  . External hemorrhoid 09/02/2014  . Allergic  rhinitis 07/19/2014  . Insomnia, persistent 07/19/2014  . Chronic LBP 07/19/2014  . Mild major depression ([place]) 07/19/2014  . Dyslipidemia 07/19/2014  . Hemorrhoid 07/19/2014  . Dysmetabolic syndrome 0000000  . 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 Osteopenia 07/19/2014  . Benign essential HTN 02/04/2009  . H/O acute myocardial infarction of lateral wall 07/18/2004    Past Surgical History:  Procedure Laterality Date  . ABDOMINAL HYSTERECTOMY    . BUNIONECTOMY Bilateral   . CORONARY STENT PLACEMENT  2009  . ELBOW SURGERY Left   . HAMMER TOE SURGERY    . RIB FRACTURE SURGERY    . WRIST FRACTURE SURGERY      Family History  Problem Relation Age of Onset  . CAD Father   . Heart attack Father   . Diabetes Sister   . Heart attack Brother   . Alzheimer's disease Mother   . Breast cancer Mother     Social History   Social History  . Marital status: Married    Spouse name: N/A  . Number of children: 1  . Years of education: N/A   Occupational History  . Supervisor    Social History Main Topics  . Smoking status: Former Smoker    Types: Cigarettes    Quit date: 05/23/2007  . Smokeless tobacco: Never Used  .  Alcohol use 0.0 oz/week     Comment: occasional  . Drug use: No  . Sexual activity: Yes   Other Topics Concern  . Not on file   Social History Narrative  . No narrative on file     Current Outpatient Prescriptions:  .  ALPRAZolam (XANAX) 0.5 MG tablet, Take 1 tablet (0.5 mg total) by mouth as needed., Disp: 30 tablet, Rfl: 0 .  aspirin 81 MG chewable tablet, Chew 1 tablet by mouth daily., Disp: , Rfl:  .  buPROPion (WELLBUTRIN XL) 300 MG 24 hr tablet, Take 1 tablet (300 mg total) by mouth daily., Disp: 90 tablet, Rfl: 1 .  carvedilol (COREG) 12.5 MG tablet, Take 1 tablet (12.5 mg total) by mouth 2 (two) times daily with a meal., Disp: 180 tablet, Rfl: 1 .  cyclobenzaprine (FLEXERIL) 10 MG tablet, Take 1 tablet (10 mg  total) by mouth at bedtime as needed., Disp: 90 tablet, Rfl: 1 .  lisinopril (PRINIVIL,ZESTRIL) 20 MG tablet, Take 1 tablet (20 mg total) by mouth daily., Disp: 90 tablet, Rfl: 1 .  Multiple Vitamins-Minerals (CENTRUM SILVER 50+WOMEN) TABS, Take 0.5 tablets by mouth daily., Disp: , Rfl:  .  pravastatin (PRAVACHOL) 40 MG tablet, Take 1 tablet (40 mg total) by mouth daily., Disp: 90 tablet, Rfl: 1  Allergies  Allergen Reactions  . Codeine   . Crestor [Rosuvastatin Calcium] Other (See Comments)    Muscle cramp     ROS  Constitutional: Negative for fever or weight change.  Respiratory: Negative for cough and shortness of breath.   Cardiovascular: Negative for chest pain or palpitations.  Gastrointestinal: Negative for abdominal pain, no bowel changes.  Musculoskeletal: Negative for gait problem or joint swelling.  Skin: Negative for rash.  Neurological: Negative for dizziness or headache.  No other specific complaints in a complete review of systems (except as listed in HPI above).  Objective  Vitals:   09/21/15 1416  BP: 126/74  Pulse: (!) 110  Resp: 16  Temp: 98.2 F (36.8 C)  TempSrc: Oral  SpO2: 96%  Weight: 142 lb 1.6 oz (64.5 kg)  Height: 5\' 2"  (1.575 m)    Body mass index is 25.99 kg/m.  Physical Exam  Constitutional: Patient appears well-developed and well-nourished. No distress.  HENT: Head: Normocephalic and atraumatic. Ears: B TMs ok, no erythema or effusion; Nose: Nose normal. Mouth/Throat: Oropharynx is clear and moist. No oropharyngeal exudate.  Eyes: Conjunctivae and EOM are normal. Pupils are equal, round, and reactive to light. No scleral icterus.  Neck: Normal range of motion. Neck supple. No JVD present. No thyromegaly present.  Cardiovascular: Normal rate, regular rhythm and normal heart sounds.  No murmur heard. No BLE edema. Pulmonary/Chest: Effort normal and breath sounds normal. No respiratory distress. Abdominal: Soft. Bowel sounds are normal, no  distension. There is no tenderness. no masses Breast: no lumps or masses, no nipple discharge or rashes FEMALE GENITALIA:  External genitalia normal External urethra normal Vaginal vault normal without discharge or lesions Cervix normal without discharge or lesions Bimanual exam normal without masses RECTAL: not done Musculoskeletal: Normal range of motion, no joint effusions. No gross deformities Neurological: he is alert and oriented to person, place, and time. No cranial nerve deficit. Coordination, balance, strength, speech and gait are normal.  Skin: Skin is warm and dry. No rash noted. No erythema.  Psychiatric: Patient has a normal mood and affect. behavior is normal. Judgment and thought content normal.  PHQ2/9: Depression screen [facility] 2/9 09/21/2015 01/06/2015  08/09/2014  Decreased Interest 0 0 0  Down, Depressed, Hopeless 0 0 0  PHQ - 2 Score 0 0 0    Fall Risk: Fall Risk  09/21/2015 01/06/2015 08/09/2014  Falls in the past year? No No No    Functional Status Survey: Is the patient deaf or have difficulty hearing?: No Does the patient have difficulty seeing, even when wearing glasses/contacts?: No Does the patient have difficulty concentrating, remembering, or making decisions?: No Does the patient have difficulty walking or climbing stairs?: No Does the patient have difficulty dressing or bathing?: No Does the patient have difficulty doing errands alone such as visiting a doctor's office or shopping?: No    Assessment & Plan  1. Well woman exam  Discussed importance of 150 minutes of physical activity weekly, eat two servings of fish weekly, eat one serving of tree nuts ( cashews, pistachios, pecans, almonds.Marland Kitchen) every other day, eat 6 servings of fruit/vegetables daily and drink plenty of water and avoid sweet beverages.   2. Colon cancer screening   refused  3. Insomnia  Takes it very seldom  - ALPRAZolam (XANAX) 0.5 MG tablet; Take 1 tablet (0.5 mg total) by  mouth as needed.  Dispense: 30 tablet; Refill: 0  4. Dyslipidemia  Needs to resume medication  - Lipid panel  5. Benign essential HTN  Well controlled - COMPLETE METABOLIC PANEL WITH GFR - CBC with Differential/Platelet - carvedilol (COREG) 12.5 MG tablet; Take 1 tablet (12.5 mg total) by mouth 2 (two) times daily with a meal.  Dispense: 180 tablet; Refill: 1 - lisinopril (PRINIVIL,ZESTRIL) 20 MG tablet; Take 1 tablet (20 mg total) by mouth daily.  Dispense: 90 tablet; Refill: 1  6. Needs flu shot  - Flu Vaccine QUAD 36+ mos IM  7. Chronic LBP  - cyclobenzaprine (FLEXERIL) 10 MG tablet; Take 1 tablet (10 mg total) by mouth at bedtime as needed.  Dispense: 90 tablet; Refill: 1  8. Hyperglycemia  - Hemoglobin A1c  9. Coronary artery disease involving left main coronary artery  - pravastatin (PRAVACHOL) 40 MG tablet; Take 1 tablet (40 mg total) by mouth daily.  Dispense: 90 tablet; Refill: 1  10. Mild major depression (H[facility])  - buPROPion (WELLBUTRIN XL) 300 MG 24 hr tablet; Take 1 tablet (300 mg total) by mouth daily.  Dispense: 90 tablet; Refill: 1  11. Biceps tendon rupture, right, sequela  It has been 3 months, explained that she does not need surgery . No weakness

## 2015-09-22 LAB — CBC WITH DIFFERENTIAL/PLATELET
BASOS PCT: 0 %
Basophils Absolute: 0 cells/uL (ref 0–200)
EOS PCT: 2 %
Eosinophils Absolute: 166 cells/uL (ref 15–500)
HCT: 42.5 % (ref 35.0–45.0)
HEMOGLOBIN: 14.3 g/dL (ref 11.7–15.5)
LYMPHS ABS: 1909 {cells}/uL (ref 850–3900)
Lymphocytes Relative: 23 %
MCH: 29.9 pg (ref 27.0–33.0)
MCHC: 33.6 g/dL (ref 32.0–36.0)
MCV: 88.7 fL (ref 80.0–100.0)
MONOS PCT: 9 %
MPV: 10.4 fL (ref 7.5–12.5)
Monocytes Absolute: 747 cells/uL (ref 200–950)
NEUTROS ABS: 5478 {cells}/uL (ref 1500–7800)
Neutrophils Relative %: 66 %
PLATELETS: 215 10*3/uL (ref 140–400)
RBC: 4.79 MIL/uL (ref 3.80–5.10)
RDW: 13.6 % (ref 11.0–15.0)
WBC: 8.3 10*3/uL (ref 3.8–10.8)

## 2015-09-22 LAB — COMPLETE METABOLIC PANEL WITH GFR
ALBUMIN: 4.3 g/dL (ref 3.6–5.1)
ALK PHOS: 57 U/L (ref 33–130)
ALT: 13 U/L (ref 6–29)
AST: 16 U/L (ref 10–35)
BILIRUBIN TOTAL: 0.5 mg/dL (ref 0.2–1.2)
BUN: 13 mg/dL (ref 7–25)
CO2: 27 mmol/L (ref 20–31)
Calcium: 9.8 mg/dL (ref 8.6–10.4)
Chloride: 103 mmol/L (ref 98–110)
Creat: 0.86 mg/dL (ref 0.50–0.99)
GFR, EST AFRICAN AMERICAN: 83 mL/min (ref >=60)
GFR, EST NON AFRICAN AMERICAN: 72 mL/min (ref >=60)
GLUCOSE: 138 mg/dL — AB (ref 65–99)
POTASSIUM: 5.3 mmol/L (ref 3.5–5.3)
SODIUM: 141 mmol/L (ref 135–146)
TOTAL PROTEIN: 7.1 g/dL (ref 6.1–8.1)

## 2015-09-22 LAB — LIPID PANEL
CHOL/HDL RATIO: 4.3 ratio (ref <=5.0)
CHOLESTEROL: 291 mg/dL — AB (ref 125–200)
HDL: 67 mg/dL (ref >=46)
LDL Cholesterol: 203 mg/dL — ABNORMAL HIGH (ref <130)
TRIGLYCERIDES: 106 mg/dL (ref <150)
VLDL: 21 mg/dL (ref <30)

## 2015-09-23 LAB — HEMOGLOBIN A1C
Hgb A1c MFr Bld: 5.7 % — ABNORMAL HIGH (ref <5.7)
MEAN PLASMA GLUCOSE: 117 mg/dL

## 2016-03-28 ENCOUNTER — Other Ambulatory Visit: Payer: Self-pay

## 2016-03-28 DIAGNOSIS — F320 Major depressive disorder, single episode, mild: Secondary | ICD-10-CM

## 2016-03-28 DIAGNOSIS — G4700 Insomnia, unspecified: Secondary | ICD-10-CM

## 2016-03-28 NOTE — Telephone Encounter (Signed)
Got a fax from [facility] requesting a refill of this patient's Xanax & Wellbutrin XL.  Refill request was sent to Dr. Steele Sizer for approval and submission.

## 2016-03-29 MED ORDER — BUPROPION HCL ER (XL) 300 MG PO TB24: 300.0000 mg | ORAL_TABLET | Freq: Every day | ORAL | 0 refills | Status: DC

## 2016-03-29 NOTE — Telephone Encounter (Signed)
Pt will call back to schedule an appt.

## 2016-04-23 ENCOUNTER — Other Ambulatory Visit: Payer: Self-pay

## 2016-04-23 DIAGNOSIS — I10 Essential (primary) hypertension: Secondary | ICD-10-CM

## 2016-04-23 MED ORDER — LISINOPRIL 20 MG PO TABS: 20.0000 mg | ORAL_TABLET | Freq: Every day | ORAL | 0 refills | Status: DC

## 2016-04-23 NOTE — Telephone Encounter (Signed)
Pt is scheduled for 05/14/16

## 2016-04-23 NOTE — Telephone Encounter (Signed)
Patient requesting refill of Lisinopril to [facility]. Patient needs a 6 month F/U for medications.

## 2016-05-14 ENCOUNTER — Ambulatory Visit: Payer: BLUE CROSS/BLUE SHIELD | Admitting: Family Medicine

## 2016-05-21 ENCOUNTER — Ambulatory Visit (INDEPENDENT_AMBULATORY_CARE_PROVIDER_SITE_OTHER): Payer: No Typology Code available for payment source | Admitting: Family Medicine

## 2016-05-21 ENCOUNTER — Encounter: Payer: Self-pay | Admitting: Family Medicine

## 2016-05-21 VITALS — BP 128/68 | HR 108 | Temp 98.3°F | Resp 16 | Ht 62.0 in | Wt 152.3 lb

## 2016-05-21 DIAGNOSIS — I2510 Atherosclerotic heart disease of native coronary artery without angina pectoris: Secondary | ICD-10-CM

## 2016-05-21 DIAGNOSIS — F320 Major depressive disorder, single episode, mild: Secondary | ICD-10-CM

## 2016-05-21 DIAGNOSIS — G5603 Carpal tunnel syndrome, bilateral upper limbs: Secondary | ICD-10-CM

## 2016-05-21 DIAGNOSIS — E785 Hyperlipidemia, unspecified: Secondary | ICD-10-CM

## 2016-05-21 DIAGNOSIS — I10 Essential (primary) hypertension: Secondary | ICD-10-CM

## 2016-05-21 DIAGNOSIS — R739 Hyperglycemia, unspecified: Secondary | ICD-10-CM | POA: Diagnosis not present

## 2016-05-21 DIAGNOSIS — G4709 Other insomnia: Secondary | ICD-10-CM

## 2016-05-21 DIAGNOSIS — M545 Low back pain, unspecified: Secondary | ICD-10-CM

## 2016-05-21 MED ORDER — GABAPENTIN 100 MG PO CAPS: 100.0000 mg | ORAL_CAPSULE | Freq: Every day | ORAL | 0 refills | Status: DC

## 2016-05-21 MED ORDER — CYCLOBENZAPRINE HCL 10 MG PO TABS: 10.0000 mg | ORAL_TABLET | Freq: Every evening | ORAL | 0 refills | Status: DC | PRN

## 2016-05-21 MED ORDER — PRAVASTATIN SODIUM 40 MG PO TABS: 40.0000 mg | ORAL_TABLET | Freq: Every day | ORAL | 5 refills | Status: DC

## 2016-05-21 MED ORDER — BUPROPION HCL ER (XL) 300 MG PO TB24: 300.0000 mg | ORAL_TABLET | Freq: Every day | ORAL | 5 refills | Status: DC

## 2016-05-21 MED ORDER — LISINOPRIL 20 MG PO TABS: 20.0000 mg | ORAL_TABLET | Freq: Every day | ORAL | 5 refills | Status: DC

## 2016-05-21 MED ORDER — CARVEDILOL 12.5 MG PO TABS: 12.5000 mg | ORAL_TABLET | Freq: Two times a day (BID) | ORAL | 5 refills | Status: DC

## 2016-05-21 MED ORDER — ALPRAZOLAM 0.5 MG PO TABS: 0.5000 mg | ORAL_TABLET | ORAL | 0 refills | Status: DC | PRN

## 2016-05-21 NOTE — Progress Notes (Signed)
Name: Elizabeth Johnson   MRN: [id number]    DOB: August 20, 1950   Date:05/21/2016       Progress Note  Subjective  Chief Complaint  Chief Complaint  Patient presents with  . Medication Refill  . Hypertension  . Insomnia  . Depression  . Numbness    in both hands  and fingers mostly the right hand which keeps her from sleeping    HPI  Numbness hands: she states symptoms started about 9 months ago, initially was sporadic and just a tingling on 2nd, 3rd and 4th fingers, however over the past 3 months it has been numb or tingling constantly, worse during sleep and wakes up to shake her right hand, over the past month she has also noticed tingling on left also.   Hyperlipidemia: she stopped taking crestor because of muscle pain and feels better, however explained to her that she has CAD and needs to resume statin therapy, we gave her  Pravastatin because of cost back in August 2017. She is currently only taking half dose because new insurance only gives her 30 day supply and she tries to make it last. Explained importance of taking it as prescribed and also to recheck labs.   HTN: patient is doing well, no chest pain , palpitation or SOB.   Major Depression: she felt upset about having to retire. Taking Wellbutrin and she is still a little bitter about the way she lost her job and worries her father that has been sick. States Wellbutrin helps.   Back pain: she has mild low back pain, she states since she stopped Crestor pain has decreased, takes Flexeril qhs prn only. Described as spasms, no radiculitis  Insomnia: she takes alprazolam very seldom and needs a refill, she never filled rx given in August   Patient Active Problem List   Diagnosis Date Noted  . Biceps tendon rupture 09/21/2015  . H/O hysterectomy for benign disease 09/02/2014  . Acquired bilateral hammer toes 09/02/2014  . External hemorrhoid 09/02/2014  . Allergic rhinitis 07/19/2014  . Insomnia, persistent 07/19/2014  .  Chronic LBP 07/19/2014  . Mild major depression ([place]) 07/19/2014  . Dyslipidemia 07/19/2014  . Hemorrhoid 07/19/2014  . Dysmetabolic syndrome 15/17/6160  . 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 Osteopenia 07/19/2014  . Benign essential HTN 02/04/2009  . H/O acute myocardial infarction of lateral wall 07/18/2004    Past Surgical History:  Procedure Laterality Date  . ABDOMINAL HYSTERECTOMY    . BUNIONECTOMY Bilateral   . CORONARY STENT PLACEMENT  2009  . ELBOW SURGERY Left   . HAMMER TOE SURGERY    . RIB FRACTURE SURGERY    . WRIST FRACTURE SURGERY      Family History  Problem Relation Age of Onset  . CAD Father   . Heart attack Father   . Diabetes Sister   . Heart attack Brother   . Alzheimer's disease Mother   . Breast cancer Mother     Social History   Social History  . Marital status: Married    Spouse name: N/A  . Number of children: 1  . Years of education: N/A   Occupational History  . Supervisor    Social History Main Topics  . Smoking status: Former Smoker    Types: Cigarettes    Quit date: 05/23/2007  . Smokeless tobacco: Never Used  . Alcohol use 0.0 oz/week     Comment: occasional  . Drug use: No  .  Sexual activity: Yes   Other Topics Concern  . Not on file   Social History Narrative  . No narrative on file     Current Outpatient Prescriptions:  .  ALPRAZolam (XANAX) 0.5 MG tablet, Take 1 tablet (0.5 mg total) by mouth as needed., Disp: 30 tablet, Rfl: 0 .  aspirin 81 MG chewable tablet, Chew 1 tablet by mouth daily., Disp: , Rfl:  .  buPROPion (WELLBUTRIN XL) 300 MG 24 hr tablet, Take 1 tablet (300 mg total) by mouth daily., Disp: 30 tablet, Rfl: 0 .  carvedilol (COREG) 12.5 MG tablet, Take 1 tablet (12.5 mg total) by mouth 2 (two) times daily with a meal., Disp: 180 tablet, Rfl: 1 .  cyclobenzaprine (FLEXERIL) 10 MG tablet, Take 1 tablet (10 mg total) by mouth at bedtime as needed., Disp: 90 tablet, Rfl:  1 .  lisinopril (PRINIVIL,ZESTRIL) 20 MG tablet, Take 1 tablet (20 mg total) by mouth daily., Disp: 30 tablet, Rfl: 0 .  Multiple Vitamins-Minerals (CENTRUM SILVER 50+WOMEN) TABS, Take 0.5 tablets by mouth daily., Disp: , Rfl:  .  pravastatin (PRAVACHOL) 40 MG tablet, Take 1 tablet (40 mg total) by mouth daily., Disp: 90 tablet, Rfl: 1  Allergies  Allergen Reactions  . Codeine   . Crestor [Rosuvastatin Calcium] Other (See Comments)    Muscle cramp     ROS  Constitutional: Negative for fever, positive for  weight change.  Respiratory: Negative for cough and shortness of breath.   Cardiovascular: Negative for chest pain or palpitations.  Gastrointestinal: Negative for abdominal pain, no bowel changes.  Musculoskeletal: Negative for gait problem or joint swelling.  Skin: Negative for rash.  Neurological: Negative for dizziness or headache.  No other specific complaints in a complete review of systems (except as listed in HPI above).  Objective  Vitals:   05/21/16 1458  BP: 128/68  Pulse: (!) 108  Resp: 16  Temp: 98.3 F (36.8 C)  SpO2: 95%  Weight: 152 lb 5 oz (69.1 kg)  Height: 5\' 2"  (1.575 m)    Body mass index is 27.86 kg/m.  Physical Exam  Constitutional: Patient appears well-developed and well-nourished. Obese  No distress.  HEENT: head atraumatic, normocephalic, pupils equal and reactive to light,  neck supple, throat within normal limits Cardiovascular: Normal rate, regular rhythm and normal heart sounds.  No murmur heard. No BLE edema. Pulmonary/Chest: Effort normal and breath sounds normal. No respiratory distress. Abdominal: Soft.  There is no tenderness. Muscular Skeletal: decrease rom of right wrist, history of traumatic fracture, also has tingling and numbness with tinnel's sign of both wrists.  Psychiatric: Patient has a normal mood and affect. behavior is normal. Judgment and thought content normal.  PHQ2/9: Depression screen [facility] 2/9 09/21/2015  01/06/2015 08/09/2014  Decreased Interest 0 0 0  Down, Depressed, Hopeless 0 0 0  PHQ - 2 Score 0 0 0     Fall Risk: Fall Risk  09/21/2015 01/06/2015 08/09/2014  Falls in the past year? No No No     Assessment & Plan  1. Carpal tunnel syndrome on both sides  - gabapentin (NEURONTIN) 100 MG capsule; Take 1-3 capsules (100-300 mg total) by mouth at bedtime.  Dispense: 90 capsule; Refill: 0 She will wear a brace and hold off on referral to Ortho at this time, she wants to go at the end of the year, discussed possible side effects of gabapentin  2. Dyslipidemia  - Lipid panel  3. Benign essential HTN  - COMPLETE METABOLIC  PANEL WITH GFR - carvedilol (COREG) 12.5 MG tablet; Take 1 tablet (12.5 mg total) by mouth 2 (two) times daily with a meal.  Dispense: 60 tablet; Refill: 5 - lisinopril (PRINIVIL,ZESTRIL) 20 MG tablet; Take 1 tablet (20 mg total) by mouth daily.  Dispense: 30 tablet; Refill: 5  4. Mild major depression (HCC)  - buPROPion (WELLBUTRIN XL) 300 MG 24 hr tablet; Take 1 tablet (300 mg total) by mouth daily.  Dispense: 30 tablet; Refill: 5  5. Coronary artery disease involving left main coronary artery  - pravastatin (PRAVACHOL) 40 MG tablet; Take 1 tablet (40 mg total) by mouth daily.  Dispense: 30 tablet; Refill: 5  6. Hyperglycemia  - Hemoglobin A1c  7. Other insomnia  - ALPRAZolam (XANAX) 0.5 MG tablet; Take 1 tablet (0.5 mg total) by mouth as needed.  Dispense: 30 tablet; Refill: 0  8. Intermittent low back pain  - cyclobenzaprine (FLEXERIL) 10 MG tablet; Take 1 tablet (10 mg total) by mouth at bedtime as needed.  Dispense: 30 tablet; Refill: 0

## 2016-08-02 ENCOUNTER — Other Ambulatory Visit: Payer: Self-pay

## 2016-08-02 DIAGNOSIS — I10 Essential (primary) hypertension: Secondary | ICD-10-CM

## 2016-08-02 DIAGNOSIS — F320 Major depressive disorder, single episode, mild: Secondary | ICD-10-CM

## 2016-08-02 DIAGNOSIS — I2510 Atherosclerotic heart disease of native coronary artery without angina pectoris: Secondary | ICD-10-CM

## 2016-08-02 NOTE — Telephone Encounter (Signed)
Patient requesting refill of Accu-Chek nano smartview meter.

## 2016-08-03 MED ORDER — ACCU-CHEK SMARTVIEW CONTROL VI LIQD: 1.0000 | Freq: Every day | 1 refills | Status: DC

## 2016-08-03 MED ORDER — LISINOPRIL 20 MG PO TABS: 20.0000 mg | ORAL_TABLET | Freq: Every day | ORAL | 1 refills | Status: DC

## 2016-08-03 MED ORDER — GLUCOSE BLOOD VI STRP: ORAL_STRIP | 2 refills | Status: DC

## 2016-08-03 MED ORDER — CARVEDILOL 12.5 MG PO TABS: 12.5000 mg | ORAL_TABLET | Freq: Two times a day (BID) | ORAL | 1 refills | Status: DC

## 2016-08-03 MED ORDER — BD SWAB SINGLE USE REGULAR PADS: 1.0000 | MEDICATED_PAD | Freq: Two times a day (BID) | 1 refills | Status: DC

## 2016-08-03 MED ORDER — ACCU-CHEK NANO SMARTVIEW W/DEVICE KIT: 1.0000 | PACK | Freq: Every day | 0 refills | Status: DC

## 2016-08-03 MED ORDER — PRAVASTATIN SODIUM 40 MG PO TABS: 40.0000 mg | ORAL_TABLET | Freq: Every day | ORAL | 1 refills | Status: DC

## 2016-08-03 MED ORDER — BUPROPION HCL ER (XL) 300 MG PO TB24: 300.0000 mg | ORAL_TABLET | Freq: Every day | ORAL | 1 refills | Status: DC

## 2016-08-09 ENCOUNTER — Other Ambulatory Visit: Payer: Self-pay | Admitting: Family Medicine

## 2016-08-09 NOTE — Telephone Encounter (Signed)
Raquel Sarna from [facility] is requesting a prescription for the Accu Check fast clix lancets drums. Please fax to [phone]

## 2016-08-10 ENCOUNTER — Other Ambulatory Visit: Payer: Self-pay | Admitting: Family Medicine

## 2016-08-10 MED ORDER — ACCU-CHEK SOFTCLIX LANCET DEV MISC: 2 refills | Status: DC

## 2016-08-10 NOTE — Telephone Encounter (Signed)
Please enter and I will sign

## 2016-08-20 ENCOUNTER — Other Ambulatory Visit: Payer: Self-pay

## 2016-08-20 MED ORDER — ACCU-CHEK SOFTCLIX LANCET DEV MISC: 1.0000 | Freq: Every day | 3 refills | Status: AC

## 2016-08-20 NOTE — Telephone Encounter (Signed)
Patient was asking for lancets with a 90 day supply with directions of testing daily with 3 refills from the pharmacy.

## 2016-10-16 ENCOUNTER — Encounter: Payer: Self-pay | Admitting: Family Medicine

## 2016-10-16 ENCOUNTER — Ambulatory Visit (INDEPENDENT_AMBULATORY_CARE_PROVIDER_SITE_OTHER): Payer: Medicare PPO | Admitting: Family Medicine

## 2016-10-16 VITALS — BP 112/60 | HR 72 | Temp 98.3°F | Resp 16 | Ht 61.0 in | Wt 149.3 lb

## 2016-10-16 DIAGNOSIS — M545 Low back pain, unspecified: Secondary | ICD-10-CM

## 2016-10-16 DIAGNOSIS — F320 Major depressive disorder, single episode, mild: Secondary | ICD-10-CM | POA: Diagnosis not present

## 2016-10-16 DIAGNOSIS — Z23 Encounter for immunization: Secondary | ICD-10-CM | POA: Diagnosis not present

## 2016-10-16 DIAGNOSIS — E8881 Metabolic syndrome: Secondary | ICD-10-CM | POA: Diagnosis not present

## 2016-10-16 DIAGNOSIS — I209 Angina pectoris, unspecified: Secondary | ICD-10-CM

## 2016-10-16 DIAGNOSIS — Z1231 Encounter for screening mammogram for malignant neoplasm of breast: Secondary | ICD-10-CM | POA: Diagnosis not present

## 2016-10-16 DIAGNOSIS — G4709 Other insomnia: Secondary | ICD-10-CM

## 2016-10-16 DIAGNOSIS — M8580 Other specified disorders of bone density and structure, unspecified site: Secondary | ICD-10-CM

## 2016-10-16 DIAGNOSIS — G5601 Carpal tunnel syndrome, right upper limb: Secondary | ICD-10-CM | POA: Diagnosis not present

## 2016-10-16 DIAGNOSIS — Z0000 Encounter for general adult medical examination without abnormal findings: Secondary | ICD-10-CM

## 2016-10-16 DIAGNOSIS — I10 Essential (primary) hypertension: Secondary | ICD-10-CM

## 2016-10-16 DIAGNOSIS — R739 Hyperglycemia, unspecified: Secondary | ICD-10-CM

## 2016-10-16 DIAGNOSIS — E785 Hyperlipidemia, unspecified: Secondary | ICD-10-CM | POA: Diagnosis not present

## 2016-10-16 DIAGNOSIS — Z1211 Encounter for screening for malignant neoplasm of colon: Secondary | ICD-10-CM | POA: Diagnosis not present

## 2016-10-16 DIAGNOSIS — I2510 Atherosclerotic heart disease of native coronary artery without angina pectoris: Secondary | ICD-10-CM | POA: Diagnosis not present

## 2016-10-16 MED ORDER — LISINOPRIL 20 MG PO TABS: 20.0000 mg | ORAL_TABLET | Freq: Every day | ORAL | 1 refills | Status: DC

## 2016-10-16 MED ORDER — NITROGLYCERIN 0.4 MG SL SUBL
0.4000 mg | SUBLINGUAL_TABLET | SUBLINGUAL | 2 refills | Status: DC | PRN
Start: 2016-10-16 — End: 2018-04-30

## 2016-10-16 MED ORDER — CYCLOBENZAPRINE HCL 10 MG PO TABS
10.0000 mg | ORAL_TABLET | Freq: Every evening | ORAL | 0 refills | Status: DC | PRN
Start: 2016-10-16 — End: 2016-10-26

## 2016-10-16 MED ORDER — CARVEDILOL 6.25 MG PO TABS: 6.2500 mg | ORAL_TABLET | Freq: Two times a day (BID) | ORAL | 1 refills | Status: DC

## 2016-10-16 MED ORDER — PRAVASTATIN SODIUM 40 MG PO TABS: 40.0000 mg | ORAL_TABLET | Freq: Every day | ORAL | 1 refills | Status: DC

## 2016-10-16 MED ORDER — ALPRAZOLAM 0.5 MG PO TABS: 0.5000 mg | ORAL_TABLET | ORAL | 0 refills | Status: DC | PRN

## 2016-10-16 MED ORDER — BUPROPION HCL ER (XL) 300 MG PO TB24: 300.0000 mg | ORAL_TABLET | Freq: Every day | ORAL | 1 refills | Status: DC

## 2016-10-16 NOTE — Progress Notes (Signed)
Patient: Elizabeth Johnson, Female    DOB: 1950-12-13, 66 y.o.   MRN: [id number]  Visit Date: 10/16/2016  Today's Provider: Loistine Chance, MD   Chief Complaint  Patient presents with  . Welcome to Medicare    Subjective:    HPI Elizabeth Johnson is a 66 y.o. female who presents today for her Subsequent Annual Wellness Visit.  Patient/Caregiver input:  She is here for welcome to medicare and regular follow up  Numbness hands: she states symptoms started over one year ago, initially was sporadic and just a tingling on 2nd, 3rd and 4th fingers right hand , however over the past 7 months it has been numb  constantly, worse during sleep and wakes up to shake her right hand but is not even helping anymore. She is having pain now, we will refer to Ortho  Hyperlipidemia: she stopped taking crestor because of muscle pain and feels better,  we gave her  Pravastatin because of cost back in August 2017, we will recheck labs today.   HTN: patient is doing well, no chest pain , palpitation or SOB.  She has noticed mild dizziness when she gets up quickly and we will adjust dose of medication  Major Depression: she felt upset about having to retire, now upset her weight and struggling being a caregiver for her father is 44 yo and has been drinking. Discussed changing medication but she would like to hold off at this time  Back pain: she has mild low back pain, she states since she stopped Crestor pain has decreased, takes Flexeril qhs prn only. Described as spasms, no radiculitis  Insomnia: she takes alprazolam very seldom and needs a refill, last filled 04/2016  Review of Systems  Constitutional: Negative for fever or weight change.  Respiratory: Negative for cough and shortness of breath.   Cardiovascular: Negative for chest pain or palpitations.  Gastrointestinal: Negative for abdominal pain, no bowel changes.  Musculoskeletal: Negative for gait problem, positive for right wrist  joint  swelling.  Skin: Negative for rash.  Neurological: Positive for intermittent  dizziness but no  headache.  No other specific complaints in a complete review of systems (except as listed in HPI above).  Past Medical History:  Diagnosis Date  . Anxiety   . Depression   . Hyperlipidemia   . Hypertension     Past Surgical History:  Procedure Laterality Date  . ABDOMINAL HYSTERECTOMY    . BUNIONECTOMY Bilateral   . CORONARY STENT PLACEMENT  2009  . ELBOW SURGERY Left   . HAMMER TOE SURGERY    . RIB FRACTURE SURGERY    . WRIST FRACTURE SURGERY      Family History  Problem Relation Age of Onset  . Alzheimer's disease Mother   . Breast cancer Mother   . CAD Father   . Heart attack Father   . Drug abuse Sister   . Suicidality Sister   . Heart attack Brother     Social History   Social History  . Marital status: Married    Spouse name: N/A  . Number of children: 1  . Years of education: N/A   Occupational History  . retired     Social History Main Topics  . Smoking status: Former Smoker    Years: 8.00    Types: Cigarettes    Start date: 01/23/1999    Quit date: 05/23/2007  . Smokeless tobacco: Never Used  . Alcohol use 0.0 oz/week     Comment:  occasional  . Drug use: No  . Sexual activity: Yes    Partners: Male   Other Topics Concern  . Not on file   Social History Narrative   She worked for the same company for 20 years and they closed [place] branch and she was forced to retire Fall 2017.    Married     Outpatient Encounter Prescriptions as of 10/16/2016  Medication Sig Note  . ACCU-CHEK SMARTVIEW test strip USE AS INSTRUCTED   . Alcohol Swabs (B-D SINGLE USE SWABS REGULAR) PADS 1 each by Does not apply route 2 (two) times daily.   Marland Kitchen ALPRAZolam (XANAX) 0.5 MG tablet Take 1 tablet (0.5 mg total) by mouth as needed.   Marland Kitchen aspirin 81 MG chewable tablet Chew 1 tablet by mouth daily. 07/21/2014: DX: 414.00 Received from: [facility] Received Sig:  ASPIRIN CHILDRENS, 81MG (Oral Tablet Chewable)  1 po qday for 0 days  Quantity: 30.00;  Refills: 0   Ordered :06-Mar-2010  Steele Sizer MD;  Started 03-Oct-2007 Active C  . Blood Glucose Calibration (ACCU-CHEK SMARTVIEW CONTROL) LIQD 1 each by In Vitro route daily.   . Blood Glucose Monitoring Suppl (ACCU-CHEK NANO SMARTVIEW) w/Device KIT 1 kit by Does not apply route daily.   Marland Kitchen buPROPion (WELLBUTRIN XL) 300 MG 24 hr tablet Take 1 tablet (300 mg total) by mouth daily.   . carvedilol (COREG) 6.25 MG tablet Take 1 tablet (6.25 mg total) by mouth 2 (two) times daily with a meal.   . cyclobenzaprine (FLEXERIL) 10 MG tablet Take 1 tablet (10 mg total) by mouth at bedtime as needed.   Elmore Guise Devices (ACCU-CHEK SOFTCLIX) lancets 1 each by Other route daily. Use as instructed   . Lancets Misc. (ACCU-CHEK SOFTCLIX LANCET DEV) KIT    . lisinopril (PRINIVIL,ZESTRIL) 20 MG tablet Take 1 tablet (20 mg total) by mouth daily.   . pravastatin (PRAVACHOL) 40 MG tablet Take 1 tablet (40 mg total) by mouth daily.   . [DISCONTINUED] ALPRAZolam (XANAX) 0.5 MG tablet Take 1 tablet (0.5 mg total) by mouth as needed.   . [DISCONTINUED] buPROPion (WELLBUTRIN XL) 300 MG 24 hr tablet Take 1 tablet (300 mg total) by mouth daily.   . [DISCONTINUED] carvedilol (COREG) 12.5 MG tablet Take 1 tablet (12.5 mg total) by mouth 2 (two) times daily with a meal.   . [DISCONTINUED] cyclobenzaprine (FLEXERIL) 10 MG tablet Take 1 tablet (10 mg total) by mouth at bedtime as needed.   . [DISCONTINUED] lisinopril (PRINIVIL,ZESTRIL) 20 MG tablet Take 1 tablet (20 mg total) by mouth daily.   . [DISCONTINUED] pravastatin (PRAVACHOL) 40 MG tablet Take 1 tablet (40 mg total) by mouth daily.   . Multiple Vitamins-Minerals (CENTRUM SILVER 50+WOMEN) TABS Take 0.5 tablets by mouth daily.   . nitroGLYCERIN (NITROSTAT) 0.4 MG SL tablet Place 1 tablet (0.4 mg total) under the tongue every 5 (five) minutes as needed for chest pain.   . [DISCONTINUED]  gabapentin (NEURONTIN) 100 MG capsule Take 1-3 capsules (100-300 mg total) by mouth at bedtime. (Patient not taking: Reported on 10/16/2016)    No facility-administered encounter medications on file as of 10/16/2016.     Allergies  Allergen Reactions  . Codeine Other (See Comments)    Sensitivity  . Crestor [Rosuvastatin Calcium] Other (See Comments)    Muscle cramp    Care Team Updated in EHR: Yes  Last Vision Exam: 2017  Wears corrective lenses: Yes, reading only  Last Dental Exam: 2017 Last Hearing  Exam: never had one Wears Hearing Aids: No  Functional Ability / Safety Screening 1.  Was the timed Get Up and Go test shorter than 30 seconds?  yes 2.  Does the patient need help with the phone, transportation, shopping,      preparing meals, housework, laundry, medications, or managing money?  yes 3.  Is the patient's home free of loose throw rugs in walkways, pet beds, electrical cords, etc?   no, except bathroom but she will resume it      Grab bars in the bathroom? no      Handrails on the stairs?   yes      Adequate lighting?   yes 4.  Has the patient noticed any hearing difficulties?   no  Diet Recall and Exercise Regimen: eating healthy, active  Advanced Care Planning: A voluntary discussion about advance care planning including the explanation and discussion of advance directives.  Discussed health care proxy and Living will, and the patient was able to identify a health care proxy as Elizabeth Johnson   Patient does not have a living will at present time. Does patient have a HCPOA?    no If yes, name and contact information: N/A Does patient have a living will or MOST form?  no  Cancer Screenings: Skin: no lesions Lung:  Low Dose CT Chest recommended if Age 6-80 years, 30 pack-year currently smoking OR have quit w/in 15years. Patient does not qualify. Not a heavy smoker - 4 cigarettes for at most 8 years Breast: Up to date on Mammogram? No  Up to date of Bone Density/Dexa?  No Colon: we will schedule it   Additional Screenings:  Hepatitis B/HIV/Syphillis: she is not interested  Hepatitis C Screening: 09/10/2013 Intimate Partner Violence: none  Objective:   Vitals: BP 112/60 (BP Location: Right Arm, Patient Position: Sitting, Cuff Size: Normal)   Pulse 72   Temp 98.3 F (36.8 C) (Oral)   Resp 16   Ht 5' 1" (1.549 m)   Wt 149 lb 4.8 oz (67.7 kg)   SpO2 98%   BMI 28.21 kg/m  Body mass index is 28.21 kg/m.   Visual Acuity Screening   Right eye Left eye Both eyes  Without correction: 20/25 20/30 20/30  With correction:       Physical Exam Constitutional: Patient appears well-developed and well-nourished. Obese  No distress.  HEENT: head atraumatic, normocephalic, pupils equal and reactive to light, ears normal TM bilaterally neck supple, throat within normal limits Cardiovascular: Normal rate, regular rhythm and normal heart sounds.  No murmur heard. No BLE edema. Pulmonary/Chest: Effort normal and breath sounds normal. No respiratory distress. Abdominal: Soft.  There is no tenderness. Psychiatric: Patient has a normal mood and affect. behavior is normal. Judgment and thought content normal. Muscular Skeletal swollen and tender right wrist with decrease rom Neurological: positive phalen and tinnels sign on right wrist, decrease sensation of middle fingers right hand  Cognitive Testing - 6-CIT  Correct? Score   What year is it? yes 0 Yes = 0    No = 4  What month is it? yes 0 Yes = 0    No = 3  Remember:     [address]ia [address], [place], [place]     What time is it? yes 0 Yes = 0    No = 3  Count backwards from 20 to 1 yes 0 Correct = 0    1 error = 2   More than 1  error = 4  Say the months of the year in reverse. yes 0 Correct = 0    1 error = 2   More than 1 error = 4  What address did I ask you to remember? yes 0 Correct = 0  1 error = 2    2 error = 4    3 error = 6    4 error = 8    All wrong = 10       TOTAL SCORE  0/28    Interpretation:  Normal  Normal (0-7) Abnormal (8-28)   Fall Risk: Fall Risk  10/16/2016 09/21/2015 01/06/2015 08/09/2014  Falls in the past year? Yes No No No  Number falls in past yr: 1 - - -  Injury with Fall? No - - -   Current Exercise Habits: Home exercise routine, Type of exercise: calisthenics, Time (Minutes): 30, Frequency (Times/Week): 3, Weekly Exercise (Minutes/Week): 90, Intensity: Moderate    Depression Screen Depression screen [facility] 2/9 10/16/2016 09/21/2015 01/06/2015 08/09/2014  Decreased Interest 1 0 0 0  Down, Depressed, Hopeless 1 0 0 0  PHQ - 2 Score 2 0 0 0  Altered sleeping 3 - - -  Tired, decreased energy 2 - - -  Change in appetite 0 - - -  Feeling bad or failure about yourself  3 - - -  Trouble concentrating 0 - - -  Moving slowly or fidgety/restless 0 - - -  Suicidal thoughts 0 - - -  PHQ-9 Score 10 - - -  Difficult doing work/chores Somewhat difficult - - -   She does not want to change medications at this time No results found for this or any previous visit (from the past 2160 hour(s)).  Assessment & Plan:    1. Welcome to Medicare preventive visit  - EKG 12-Lead - MM Digital Screening; Future - DG Bone Density; Future - Ambulatory referral to Gastroenterology - CBC with Differential/Platelet - COMPLETE METABOLIC PANEL WITH GFR - Hemoglobin A1c - Lipid panel  2. Benign essential HTN  We will decrease dose Coreg because bp is towards low end of normal and she has noticed some dizziness when standing up - CBC with Differential/Platelet - COMPLETE METABOLIC PANEL WITH GFR - carvedilol (COREG) 6.25 MG tablet; Take 1 tablet (6.25 mg total) by mouth 2 (two) times daily with a meal.  Dispense: 180 tablet; Refill: 1 - lisinopril (PRINIVIL,ZESTRIL) 20 MG tablet; Take 1 tablet (20 mg total) by mouth daily.  Dispense: 90 tablet; Refill: 1  3. Dyslipidemia  - Lipid panel  4. Coronary artery disease involving left main coronary artery  - pravastatin  (PRAVACHOL) 40 MG tablet; Take 1 tablet (40 mg total) by mouth daily.  Dispense: 90 tablet; Refill: 1  5. Dysmetabolic syndrome  Recheck labs   6. Mild major depression (HCC)  - buPROPion (WELLBUTRIN XL) 300 MG 24 hr tablet; Take 1 tablet (300 mg total) by mouth daily.  Dispense: 90 tablet; Refill: 1  7. Osteopenia, unspecified location  - DG Bone Density; Future  8. Need for vaccination for pneumococcus  - Pneumococcal polysaccharide vaccine 23-valent greater than or equal to 2yo subcutaneous/IM  9. Needs flu shot  - Flu vaccine HIGH DOSE PF  10. Colon cancer screening  - Ambulatory referral to Gastroenterology  11. Encounter for screening mammogram for breast cancer  - MM Digital Screening; Future  12. Hyperglycemia  - Hemoglobin A1c  13. Intermittent low back pain  - cyclobenzaprine (FLEXERIL) 10  MG tablet; Take 1 tablet (10 mg total) by mouth at bedtime as needed.  Dispense: 90 tablet; Refill: 0  14. Other insomnia  - ALPRAZolam (XANAX) 0.5 MG tablet; Take 1 tablet (0.5 mg total) by mouth as needed.  Dispense: 30 tablet; Refill: 0  15. Angina pectoris syndrome (HCC)  - nitroGLYCERIN (NITROSTAT) 0.4 MG SL tablet; Place 1 tablet (0.4 mg total) under the tongue every 5 (five) minutes as needed for chest pain.  Dispense: 10 tablet; Refill: 2 type dotphrase "dot"diagmed to refresh this list  16. Carpal tunnel syndrome on right  - Ambulatory referral to Orthopedic Surgery  Exercise Activities and Dietary recommendations Goals    None     - Discussed health benefits of physical activity, and encouraged her to engage in regular exercise appropriate for her age and condition.   Immunization History  Administered Date(s) Administered  . Influenza Split 10/15/2011  . Influenza,inj,Quad PF,6+ Mos 09/14/2013, 01/06/2015, 09/21/2015  . Pneumococcal Polysaccharide-23 09/25/2007  . Tdap 09/25/2007  . Zoster 05/19/2012    Health Maintenance  Topic Date Due  .  COLONOSCOPY  08/31/2001  . INFLUENZA VACCINE  08/22/2016  . DEXA SCAN  08/31/2016  . PNA vac Low Risk Adult (1 of 2 - PCV13) 08/31/2016  . MAMMOGRAM  12/08/2016  . TETANUS/TDAP  09/24/2017  . Hepatitis C Screening  Completed  . HIV Screening  Completed    Meds ordered this encounter  Medications  . Lancets Misc. (ACCU-CHEK SOFTCLIX LANCET DEV) KIT  . buPROPion (WELLBUTRIN XL) 300 MG 24 hr tablet    Sig: Take 1 tablet (300 mg total) by mouth daily.    Dispense:  90 tablet    Refill:  1  . carvedilol (COREG) 6.25 MG tablet    Sig: Take 1 tablet (6.25 mg total) by mouth 2 (two) times daily with a meal.    Dispense:  180 tablet    Refill:  1  . lisinopril (PRINIVIL,ZESTRIL) 20 MG tablet    Sig: Take 1 tablet (20 mg total) by mouth daily.    Dispense:  90 tablet    Refill:  1  . pravastatin (PRAVACHOL) 40 MG tablet    Sig: Take 1 tablet (40 mg total) by mouth daily.    Dispense:  90 tablet    Refill:  1  . cyclobenzaprine (FLEXERIL) 10 MG tablet    Sig: Take 1 tablet (10 mg total) by mouth at bedtime as needed.    Dispense:  90 tablet    Refill:  0  . ALPRAZolam (XANAX) 0.5 MG tablet    Sig: Take 1 tablet (0.5 mg total) by mouth as needed.    Dispense:  30 tablet    Refill:  0  . nitroGLYCERIN (NITROSTAT) 0.4 MG SL tablet    Sig: Place 1 tablet (0.4 mg total) under the tongue every 5 (five) minutes as needed for chest pain.    Dispense:  10 tablet    Refill:  2    Current Outpatient Prescriptions:  .  ACCU-CHEK SMARTVIEW test strip, USE AS INSTRUCTED, Disp: 200 each, Rfl: 2 .  Alcohol Swabs (B-D SINGLE USE SWABS REGULAR) PADS, 1 each by Does not apply route 2 (two) times daily., Disp: 200 each, Rfl: 1 .  ALPRAZolam (XANAX) 0.5 MG tablet, Take 1 tablet (0.5 mg total) by mouth as needed., Disp: 30 tablet, Rfl: 0 .  aspirin 81 MG chewable tablet, Chew 1 tablet by mouth daily., Disp: , Rfl:  .  Blood  Glucose Calibration (ACCU-CHEK SMARTVIEW CONTROL) LIQD, 1 each by In Vitro  route daily., Disp: 200 each, Rfl: 1 .  Blood Glucose Monitoring Suppl (ACCU-CHEK NANO SMARTVIEW) w/Device KIT, 1 kit by Does not apply route daily., Disp: 1 kit, Rfl: 0 .  buPROPion (WELLBUTRIN XL) 300 MG 24 hr tablet, Take 1 tablet (300 mg total) by mouth daily., Disp: 90 tablet, Rfl: 1 .  carvedilol (COREG) 6.25 MG tablet, Take 1 tablet (6.25 mg total) by mouth 2 (two) times daily with a meal., Disp: 180 tablet, Rfl: 1 .  cyclobenzaprine (FLEXERIL) 10 MG tablet, Take 1 tablet (10 mg total) by mouth at bedtime as needed., Disp: 90 tablet, Rfl: 0 .  Lancet Devices (ACCU-CHEK SOFTCLIX) lancets, 1 each by Other route daily. Use as instructed, Disp: 100 each, Rfl: 3 .  Lancets Misc. (ACCU-CHEK SOFTCLIX LANCET DEV) KIT, , Disp: , Rfl:  .  lisinopril (PRINIVIL,ZESTRIL) 20 MG tablet, Take 1 tablet (20 mg total) by mouth daily., Disp: 90 tablet, Rfl: 1 .  pravastatin (PRAVACHOL) 40 MG tablet, Take 1 tablet (40 mg total) by mouth daily., Disp: 90 tablet, Rfl: 1 .  Multiple Vitamins-Minerals (CENTRUM SILVER 50+WOMEN) TABS, Take 0.5 tablets by mouth daily., Disp: , Rfl:  .  nitroGLYCERIN (NITROSTAT) 0.4 MG SL tablet, Place 1 tablet (0.4 mg total) under the tongue every 5 (five) minutes as needed for chest pain., Disp: 10 tablet, Rfl: 2 Medications Discontinued During This Encounter  Medication Reason  . gabapentin (NEURONTIN) 100 MG capsule Completed Course  . buPROPion (WELLBUTRIN XL) 300 MG 24 hr tablet Reorder  . carvedilol (COREG) 12.5 MG tablet Reorder  . lisinopril (PRINIVIL,ZESTRIL) 20 MG tablet Reorder  . pravastatin (PRAVACHOL) 40 MG tablet Reorder  . cyclobenzaprine (FLEXERIL) 10 MG tablet Reorder  . ALPRAZolam (XANAX) 0.5 MG tablet Reorder    I have personally reviewed and addressed the Medicare Annual Wellness health risk assessment questionnaire and have noted the following in the patient's chart:  A.         Medical and social history & family history B.         Use of alcohol, tobacco,  and illicit drugs  C.         Current medications and supplements D.         Functional and Cognitive ability and status E.         Nutritional status F.         Physical activity G.        Advance directives H.         List of other physicians I.          Hospitalizations, surgeries, and ER visits in previous 12 months J.         [place] such as hearing, vision, cognitive function, and depression L.         Referrals and appointments: colonoscopy, ortho for carpal tunnel  In addition, I have reviewed and discussed with patient certain preventive protocols, quality metrics, and best practice recommendations. A written personalized care plan for preventive services as well as general preventive health recommendations were provided to patient.  See attached scanned questionnaire for additional information.

## 2016-10-16 NOTE — Patient Instructions (Signed)
Try black cohosh for hot flashes.   Preventive Care 65 Years and Older, Female Preventive care refers to lifestyle choices and visits with your health care provider that can promote health and wellness. What does preventive care include?  A yearly physical exam. This is also called an annual well check.  Dental exams once or twice a year.  Routine eye exams. Ask your health care provider how often you should have your eyes checked.  Personal lifestyle choices, including: ? Daily care of your teeth and gums. ? Regular physical activity. ? Eating a healthy diet. ? Avoiding tobacco and drug use. ? Limiting alcohol use. ? Practicing safe sex. ? Taking low-dose aspirin every day. ? Taking vitamin and mineral supplements as recommended by your health care provider. What happens during an annual well check? The services and screenings done by your health care provider during your annual well check will depend on your age, overall health, lifestyle risk factors, and family history of disease. Counseling Your health care provider may ask you questions about your:  Alcohol use.  Tobacco use.  Drug use.  Emotional well-being.  Home and relationship well-being.  Sexual activity.  Eating habits.  History of falls.  Memory and ability to understand (cognition).  Work and work Statistician.  Reproductive health.  Screening You may have the following tests or measurements:  Height, weight, and BMI.  Blood pressure.  Lipid and cholesterol levels. These may be checked every 5 years, or more frequently if you are over 58 years old.  Skin check.  Lung cancer screening. You may have this screening every year starting at age 65 if you have a 30-pack-year history of smoking and currently smoke or have quit within the past 15 years.  Fecal occult blood test (FOBT) of the stool. You may have this test every year starting at age 65.  Flexible sigmoidoscopy or colonoscopy. You may  have a sigmoidoscopy every 5 years or a colonoscopy every 10 years starting at age 65.  Hepatitis C blood test.  Hepatitis B blood test.  Sexually transmitted disease (STD) testing.  Diabetes screening. This is done by checking your blood sugar (glucose) after you have not eaten for a while (fasting). You may have this done every 1-3 years.  Bone density scan. This is done to screen for osteoporosis. You may have this done starting at age 65.  Mammogram. This may be done every 1-2 years. Talk to your health care provider about how often you should have regular mammograms.  Talk with your health care provider about your test results, treatment options, and if necessary, the need for more tests. Vaccines Your health care provider may recommend certain vaccines, such as:  Influenza vaccine. This is recommended every year.  Tetanus, diphtheria, and acellular pertussis (Tdap, Td) vaccine. You may need a Td booster every 10 years.  Varicella vaccine. You may need this if you have not been vaccinated.  Zoster vaccine. You may need this after age 65.  Measles, mumps, and rubella (MMR) vaccine. You may need at least one dose of MMR if you were born in 1957 or later. You may also need a second dose.  Pneumococcal 13-valent conjugate (PCV13) vaccine. One dose is recommended after age 65.  Pneumococcal polysaccharide (PPSV23) vaccine. One dose is recommended after age 65.  Meningococcal vaccine. You may need this if you have certain conditions.  Hepatitis A vaccine. You may need this if you have certain conditions or if you travel or work in  places where you may be exposed to hepatitis A.  Hepatitis B vaccine. You may need this if you have certain conditions or if you travel or work in places where you may be exposed to hepatitis B.  Haemophilus influenzae type b (Hib) vaccine. You may need this if you have certain conditions.  Talk to your health care provider about which screenings and  vaccines you need and how often you need them. This information is not intended to replace advice given to you by your health care provider. Make sure you discuss any questions you have with your health care provider. Document Released: 02/04/2015 Document Revised: 09/28/2015 Document Reviewed: 11/09/2014 Elsevier Interactive Patient Education  2017 [employer].

## 2016-10-17 LAB — CBC WITH DIFFERENTIAL/PLATELET
BASOS ABS: 62 {cells}/uL (ref 0–200)
Basophils Relative: 0.7 %
EOS ABS: 185 {cells}/uL (ref 15–500)
Eosinophils Relative: 2.1 %
HCT: 42.1 % (ref 35.0–45.0)
Hemoglobin: 14 g/dL (ref 11.7–15.5)
Lymphs Abs: 2552 cells/uL (ref 850–3900)
MCH: 28.8 pg (ref 27.0–33.0)
MCHC: 33.3 g/dL (ref 32.0–36.0)
MCV: 86.6 fL (ref 80.0–100.0)
MPV: 9 fL (ref 7.5–12.5)
Monocytes Relative: 11.2 %
NEUTROS PCT: 57 %
Neutro Abs: 5016 cells/uL (ref 1500–7800)
PLATELETS: 330 10*3/uL (ref 140–400)
RBC: 4.86 10*6/uL (ref 3.80–5.10)
RDW: 12.5 % (ref 11.0–15.0)
TOTAL LYMPHOCYTE: 29 %
WBC: 8.8 10*3/uL (ref 3.8–10.8)
WBCMIX: 986 {cells}/uL — AB (ref 200–950)

## 2016-10-17 LAB — COMPLETE METABOLIC PANEL WITH GFR
AG Ratio: 1.6 (calc) (ref 1.0–2.5)
ALBUMIN MSPROF: 4.5 g/dL (ref 3.6–5.1)
ALT: 20 U/L (ref 6–29)
AST: 21 U/L (ref 10–35)
Alkaline phosphatase (APISO): 81 U/L (ref 33–130)
BUN: 11 mg/dL (ref 7–25)
CALCIUM: 9.5 mg/dL (ref 8.6–10.4)
CO2: 27 mmol/L (ref 20–32)
Chloride: 101 mmol/L (ref 98–110)
Creat: 0.75 mg/dL (ref 0.50–0.99)
GFR, EST AFRICAN AMERICAN: 97 mL/min/{1.73_m2} (ref >=60)
GFR, EST NON AFRICAN AMERICAN: 84 mL/min/{1.73_m2} (ref >=60)
GLUCOSE: 121 mg/dL — AB (ref 65–99)
Globulin: 2.8 g/dL (calc) (ref 1.9–3.7)
Potassium: 4.7 mmol/L (ref 3.5–5.3)
Sodium: 137 mmol/L (ref 135–146)
TOTAL PROTEIN: 7.3 g/dL (ref 6.1–8.1)
Total Bilirubin: 0.5 mg/dL (ref 0.2–1.2)

## 2016-10-17 LAB — HEMOGLOBIN A1C
EAG (MMOL/L): 6.8 (calc)
Hgb A1c MFr Bld: 5.9 % of total Hgb — ABNORMAL HIGH (ref <5.7)
Mean Plasma Glucose: 123 (calc)

## 2016-10-17 LAB — LIPID PANEL
CHOLESTEROL: 238 mg/dL — AB (ref <200)
HDL: 54 mg/dL (ref >50)
LDL CHOLESTEROL (CALC): 140 mg/dL — AB
Non-HDL Cholesterol (Calc): 184 mg/dL (calc) — ABNORMAL HIGH (ref <130)
TRIGLYCERIDES: 284 mg/dL — AB (ref <150)
Total CHOL/HDL Ratio: 4.4 (calc) (ref <5.0)

## 2016-10-23 ENCOUNTER — Other Ambulatory Visit: Payer: Self-pay

## 2016-10-23 NOTE — Progress Notes (Unsigned)
tz

## 2016-10-26 ENCOUNTER — Other Ambulatory Visit: Payer: Self-pay

## 2016-10-26 DIAGNOSIS — M545 Low back pain, unspecified: Secondary | ICD-10-CM

## 2016-10-26 MED ORDER — CYCLOBENZAPRINE HCL 10 MG PO TABS: 10.0000 mg | ORAL_TABLET | Freq: Every evening | ORAL | 2 refills | Status: AC | PRN

## 2016-10-26 NOTE — Telephone Encounter (Signed)
[employer] did not cover Flexeril and it is cheaper to file without insurance at [facility]. Patient would like prescription to be sent to [facility] and will pay cash for prescription.

## 2016-11-16 ENCOUNTER — Telehealth: Payer: Self-pay

## 2016-11-16 ENCOUNTER — Other Ambulatory Visit: Payer: Self-pay

## 2016-11-16 DIAGNOSIS — Z1212 Encounter for screening for malignant neoplasm of rectum: Principal | ICD-10-CM

## 2016-11-16 DIAGNOSIS — Z1211 Encounter for screening for malignant neoplasm of colon: Secondary | ICD-10-CM

## 2016-11-16 NOTE — Telephone Encounter (Signed)
Gastroenterology Pre-Procedure Review  Request Date: 11/2 Requesting Physician: Dr. Vicente Males  PATIENT REVIEW QUESTIONS: The patient responded to the following health history questions as indicated:    1. Are you having any GI issues? no 2. Do you have a personal history of Polyps? no 3. Do you have a family history of Colon Cancer or Polyps? no 4. Diabetes Mellitus? no 5. Joint replacements in the past 12 months?no 6. Major health problems in the past 3 months?no 7. Any artificial heart valves, MVP, or defibrillator?yes (MI 2009; stent)    MEDICATIONS & ALLERGIES:    Patient reports the following regarding taking any anticoagulation/antiplatelet therapy:   Plavix, Coumadin, Eliquis, Xarelto, Lovenox, Pradaxa, Brilinta, or Effient? no Aspirin? no  Patient confirms/reports the following medications:  Current Outpatient Prescriptions  Medication Sig Dispense Refill  . ACCU-CHEK SMARTVIEW test strip USE AS INSTRUCTED 200 each 2  . Alcohol Swabs (B-D SINGLE USE SWABS REGULAR) PADS 1 each by Does not apply route 2 (two) times daily. 200 each 1  . ALPRAZolam (XANAX) 0.5 MG tablet Take 1 tablet (0.5 mg total) by mouth as needed. 30 tablet 0  . aspirin 81 MG chewable tablet Chew 1 tablet by mouth daily.    . Blood Glucose Calibration (ACCU-CHEK SMARTVIEW CONTROL) LIQD 1 each by In Vitro route daily. 200 each 1  . Blood Glucose Monitoring Suppl (ACCU-CHEK NANO SMARTVIEW) w/Device KIT 1 kit by Does not apply route daily. 1 kit 0  . buPROPion (WELLBUTRIN XL) 300 MG 24 hr tablet Take 1 tablet (300 mg total) by mouth daily. 90 tablet 1  . carvedilol (COREG) 6.25 MG tablet Take 1 tablet (6.25 mg total) by mouth 2 (two) times daily with a meal. 180 tablet 1  . cyclobenzaprine (FLEXERIL) 10 MG tablet Take 1 tablet (10 mg total) by mouth at bedtime as needed. 30 tablet 2  . Lancet Devices (ACCU-CHEK SOFTCLIX) lancets 1 each by Other route daily. Use as instructed 100 each 3  . Lancets Misc. (ACCU-CHEK  SOFTCLIX LANCET DEV) KIT     . lisinopril (PRINIVIL,ZESTRIL) 20 MG tablet Take 1 tablet (20 mg total) by mouth daily. 90 tablet 1  . Multiple Vitamins-Minerals (CENTRUM SILVER 50+WOMEN) TABS Take 0.5 tablets by mouth daily.    . nitroGLYCERIN (NITROSTAT) 0.4 MG SL tablet Place 1 tablet (0.4 mg total) under the tongue every 5 (five) minutes as needed for chest pain. 10 tablet 2  . pravastatin (PRAVACHOL) 40 MG tablet Take 1 tablet (40 mg total) by mouth daily. 90 tablet 1   No current facility-administered medications for this visit.     Patient confirms/reports the following allergies:  Allergies  Allergen Reactions  . Codeine Other (See Comments)    Sensitivity  . Crestor [Rosuvastatin Calcium] Other (See Comments)    Muscle cramp    No orders of the defined types were placed in this encounter.   AUTHORIZATION INFORMATION Primary Insurance: 1D#: Group #:  Secondary Insurance: 1D#: Group #:  SCHEDULE INFORMATION: Date: 11/2 Time: Location: [place]

## 2016-11-19 ENCOUNTER — Ambulatory Visit: Payer: Medicare PPO | Admitting: Family Medicine

## 2016-11-23 ENCOUNTER — Ambulatory Visit: Payer: Medicare PPO | Admitting: Anesthesiology

## 2016-11-23 ENCOUNTER — Encounter
Admission: RE | Disposition: A | Discharge status: Home / Self Care | Payer: Self-pay | Source: Ambulatory Visit | Attending: Gastroenterology

## 2016-11-23 ENCOUNTER — Ambulatory Visit
Admission: RE | Admit: 2016-11-23 | Discharge: 2016-11-23 | Disposition: A | Discharge status: Home / Self Care | Payer: Medicare PPO | Source: Ambulatory Visit | Attending: Gastroenterology | Admitting: Gastroenterology

## 2016-11-23 ENCOUNTER — Encounter: Payer: Self-pay | Admitting: Anesthesiology

## 2016-11-23 DIAGNOSIS — G709 Myoneural disorder, unspecified: Secondary | ICD-10-CM | POA: Diagnosis not present

## 2016-11-23 DIAGNOSIS — K5730 Diverticulosis of large intestine without perforation or abscess without bleeding: Secondary | ICD-10-CM | POA: Diagnosis not present

## 2016-11-23 DIAGNOSIS — Z8249 Family history of ischemic heart disease and other diseases of the circulatory system: Secondary | ICD-10-CM | POA: Diagnosis not present

## 2016-11-23 DIAGNOSIS — Z955 Presence of coronary angioplasty implant and graft: Secondary | ICD-10-CM | POA: Insufficient documentation

## 2016-11-23 DIAGNOSIS — Z803 Family history of malignant neoplasm of breast: Secondary | ICD-10-CM | POA: Insufficient documentation

## 2016-11-23 DIAGNOSIS — E785 Hyperlipidemia, unspecified: Secondary | ICD-10-CM | POA: Diagnosis not present

## 2016-11-23 DIAGNOSIS — Z90710 Acquired absence of both cervix and uterus: Secondary | ICD-10-CM | POA: Diagnosis not present

## 2016-11-23 DIAGNOSIS — K5790 Diverticulosis of intestine, part unspecified, without perforation or abscess without bleeding: Secondary | ICD-10-CM | POA: Diagnosis not present

## 2016-11-23 DIAGNOSIS — Z87891 Personal history of nicotine dependence: Secondary | ICD-10-CM | POA: Diagnosis not present

## 2016-11-23 DIAGNOSIS — F419 Anxiety disorder, unspecified: Secondary | ICD-10-CM | POA: Diagnosis not present

## 2016-11-23 DIAGNOSIS — Z1212 Encounter for screening for malignant neoplasm of rectum: Secondary | ICD-10-CM

## 2016-11-23 DIAGNOSIS — Z1211 Encounter for screening for malignant neoplasm of colon: Secondary | ICD-10-CM

## 2016-11-23 DIAGNOSIS — Z813 Family history of other psychoactive substance abuse and dependence: Secondary | ICD-10-CM | POA: Insufficient documentation

## 2016-11-23 DIAGNOSIS — Z79899 Other long term (current) drug therapy: Secondary | ICD-10-CM | POA: Diagnosis not present

## 2016-11-23 DIAGNOSIS — K640 First degree hemorrhoids: Secondary | ICD-10-CM

## 2016-11-23 DIAGNOSIS — Z888 Allergy status to other drugs, medicaments and biological substances status: Secondary | ICD-10-CM | POA: Insufficient documentation

## 2016-11-23 DIAGNOSIS — Z885 Allergy status to narcotic agent status: Secondary | ICD-10-CM | POA: Insufficient documentation

## 2016-11-23 DIAGNOSIS — Z818 Family history of other mental and behavioral disorders: Secondary | ICD-10-CM | POA: Insufficient documentation

## 2016-11-23 DIAGNOSIS — Z9889 Other specified postprocedural states: Secondary | ICD-10-CM | POA: Diagnosis not present

## 2016-11-23 DIAGNOSIS — D122 Benign neoplasm of ascending colon: Secondary | ICD-10-CM

## 2016-11-23 DIAGNOSIS — F329 Major depressive disorder, single episode, unspecified: Secondary | ICD-10-CM | POA: Insufficient documentation

## 2016-11-23 DIAGNOSIS — I10 Essential (primary) hypertension: Secondary | ICD-10-CM | POA: Diagnosis not present

## 2016-11-23 DIAGNOSIS — Z820 Family history of epilepsy and other diseases of the nervous system: Secondary | ICD-10-CM | POA: Insufficient documentation

## 2016-11-23 DIAGNOSIS — K635 Polyp of colon: Secondary | ICD-10-CM | POA: Diagnosis not present

## 2016-11-23 HISTORY — PX: COLONOSCOPY WITH PROPOFOL: SHX5780

## 2016-11-23 SURGERY — COLONOSCOPY WITH PROPOFOL
Anesthesia: General

## 2016-11-23 MED ORDER — PROPOFOL 10 MG/ML IV BOLUS
INTRAVENOUS | Status: DC | PRN
  Administered 2016-11-23: 30 mg via INTRAVENOUS
  Administered 2016-11-23: 90 mg via INTRAVENOUS
  Administered 2016-11-23: 30 mg via INTRAVENOUS

## 2016-11-23 MED ORDER — PROPOFOL 500 MG/50ML IV EMUL
INTRAVENOUS | Status: DC | PRN
  Administered 2016-11-23: 140 ug/kg/min via INTRAVENOUS

## 2016-11-23 MED ORDER — SODIUM CHLORIDE 0.9 % IV SOLN
INTRAVENOUS | Status: DC
  Administered 2016-11-23: 1000 mL via INTRAVENOUS

## 2016-11-23 MED ORDER — PROPOFOL 500 MG/50ML IV EMUL
INTRAVENOUS | Status: AC
  Filled 2016-11-23: qty 50

## 2016-11-23 MED ORDER — PHENYLEPHRINE HCL 10 MG/ML IJ SOLN
INTRAMUSCULAR | Status: DC | PRN
  Administered 2016-11-23 (×2): 200 ug via INTRAVENOUS

## 2016-11-23 NOTE — H&P (Signed)
[name], MD [address] [place], [place], [place], [address] [address], [place], [place], [place], [address] Phone: [phone]  Fax: [phone]  Primary Care Physician:  Steele Sizer, MD   Pre-Procedure History & Physical: HPI:  Elizabeth Johnson is a 66 y.o. female is here for an colonoscopy.   Past Medical History:  Diagnosis Date  . Anxiety   . Depression   . Hyperlipidemia   . Hypertension     Past Surgical History:  Procedure Laterality Date  . ABDOMINAL HYSTERECTOMY    . BUNIONECTOMY Bilateral   . CORONARY STENT PLACEMENT  2009  . ELBOW SURGERY Left   . HAMMER TOE SURGERY    . RIB FRACTURE SURGERY    . WRIST FRACTURE SURGERY      Prior to Admission medications   Medication Sig Start Date End Date Taking? Authorizing Provider  ACCU-CHEK SMARTVIEW test strip USE AS INSTRUCTED 08/10/16  Yes Sowles, Drue Stager, MD  Alcohol Swabs (B-D SINGLE USE SWABS REGULAR) PADS 1 each by Does not apply route 2 (two) times daily. 08/03/16  Yes Sowles, Drue Stager, MD  ALPRAZolam Duanne Moron) 0.5 MG tablet Take 1 tablet (0.5 mg total) by mouth as needed. 10/16/16  Yes Steele Sizer, MD  aspirin 81 MG chewable tablet Chew 1 tablet by mouth daily. 10/03/07  Yes [provider]  Blood Glucose Calibration (ACCU-CHEK SMARTVIEW CONTROL) LIQD 1 each by In Vitro route daily. 08/03/16  Yes Sowles, Drue Stager, MD  Blood Glucose Monitoring Suppl (ACCU-CHEK NANO SMARTVIEW) w/Device KIT 1 kit by Does not apply route daily. 08/03/16  Yes Sowles, Drue Stager, MD  buPROPion (WELLBUTRIN XL) 300 MG 24 hr tablet Take 1 tablet (300 mg total) by mouth daily. 10/16/16  Yes Sowles, Drue Stager, MD  carvedilol (COREG) 6.25 MG tablet Take 1 tablet (6.25 mg total) by mouth 2 (two) times daily with a meal. 10/16/16  Yes Sowles, Drue Stager, MD  cyclobenzaprine (FLEXERIL) 10 MG tablet Take 1 tablet (10 mg total) by mouth at bedtime as needed. 10/26/16 11/25/16 Yes Sowles, Drue Stager, MD  Lancets Misc.  (ACCU-CHEK SOFTCLIX LANCET DEV) KIT  08/28/16  Yes [provider]  lisinopril (PRINIVIL,ZESTRIL) 20 MG tablet Take 1 tablet (20 mg total) by mouth daily. 10/16/16  Yes Sowles, Drue Stager, MD  Multiple Vitamins-Minerals (CENTRUM SILVER 50+WOMEN) TABS Take 0.5 tablets by mouth daily.   Yes [provider]  nitroGLYCERIN (NITROSTAT) 0.4 MG SL tablet Place 1 tablet (0.4 mg total) under the tongue every 5 (five) minutes as needed for chest pain. 10/16/16  Yes Sowles, Drue Stager, MD  pravastatin (PRAVACHOL) 40 MG tablet Take 1 tablet (40 mg total) by mouth daily. 10/16/16  Yes Steele Sizer, MD    Allergies as of 11/16/2016 - Review Complete 10/16/2016  Allergen Reaction Noted  . Codeine Other (See Comments) 07/19/2014  . Crestor [rosuvastatin calcium] Other (See Comments) 09/21/2015    Family History  Problem Relation Age of Onset  . Alzheimer's disease Mother   . Breast cancer Mother   . CAD Father   . Heart attack Father   . Drug abuse Sister   . Suicidality Sister   . Heart attack Brother     Social History   Social History  . Marital status: Married    Spouse name: N/A  . Number of children: 1  . Years of education: N/A   Occupational History  . retired     Social History Main Topics  . Smoking  status: Former Smoker    Years: 8.00    Types: Cigarettes    Start date: 01/23/1999    Quit date: 05/23/2007  . Smokeless tobacco: Never Used  . Alcohol use 0.0 oz/week     Comment: occasional  . Drug use: No  . Sexual activity: Yes    Partners: Male   Other Topics Concern  . Not on file   Social History Narrative   She worked for the same company for 20 years and they closed [place] branch and she was forced to retire Fall 2017.    Married     Review of Systems: See HPI, otherwise negative ROS  Physical Exam: BP (!) 145/90   Pulse (!) 119   Temp (!) 97 F (36.1 C) (Tympanic)   Resp 16   Ht '5\' 1"'  (1.549 m)   Wt 142 lb (64.4 kg)   SpO2 100%   BMI 26.83 kg/m    General:   Alert,  pleasant and cooperative in NAD Head:  Normocephalic and atraumatic. Neck:  Supple; no masses or thyromegaly. Lungs:  Clear throughout to auscultation, normal respiratory effort.    Heart:  +S1, +S2, Regular rate and rhythm, No edema. Abdomen:  Soft, nontender and nondistended. Normal bowel sounds, without guarding, and without rebound.   Neurologic:  Alert and  oriented x4;  grossly normal neurologically.  Impression/Plan: Elizabeth Johnson is here for an colonoscopy to be performed for Screening colonoscopy average risk   Risks, benefits, limitations, and alternatives regarding  colonoscopy have been reviewed with the patient.  Questions have been answered.  All parties agreeable.   [name], MD  11/23/2016, 10:59 AM

## 2016-11-23 NOTE — Anesthesia Postprocedure Evaluation (Signed)
Anesthesia Post Note  Patient: Elizabeth Johnson  Procedure(s) Performed: COLONOSCOPY WITH PROPOFOL (N/A )  Patient location during evaluation: Endoscopy Anesthesia Type: General Level of consciousness: awake and alert Pain management: pain level controlled Vital Signs Assessment: post-procedure vital signs reviewed and stable Respiratory status: spontaneous breathing, nonlabored ventilation, respiratory function stable and patient connected to nasal cannula oxygen Cardiovascular status: blood pressure returned to baseline and stable Postop Assessment: no apparent nausea or vomiting Anesthetic complications: no     Last Vitals:  Vitals:   11/23/16 1150 11/23/16 1200  BP: 94/76 118/79  Pulse: 93   Resp: 15   Temp:    SpO2: 100%     Last Pain:  Vitals:   11/23/16 1130  TempSrc: Tympanic                 Precious Haws Piscitello

## 2016-11-23 NOTE — Op Note (Signed)
[facility] Gastroenterology Patient Name: Elizabeth Johnson Procedure Date: 11/23/2016 11:06 AM MRN: [id number] Account #: [id number] Date of Birth: 1951/01/01 Admit Type: Outpatient Age: 66 Room: [facility] ENDO ROOM 4 Gender: Female Note Status: Finalized Procedure:            Colonoscopy Indications:          Screening for colorectal malignant neoplasm Providers:            [name] MD, MD Referring MD:         Bethena Roys. Sowles, MD (Referring MD) Medicines:            Monitored Anesthesia Care Complications:        No immediate complications. Procedure:            Pre-Anesthesia Assessment:                       - Prior to the procedure, a History and Physical was                        performed, and patient medications, allergies and                        sensitivities were reviewed. The patient's tolerance of                        previous anesthesia was reviewed.                       - The risks and benefits of the procedure and the                        sedation options and risks were discussed with the                        patient. All questions were answered and informed                        consent was obtained.                       - ASA Grade Assessment: III - A patient with severe                        systemic disease.                       After obtaining informed consent, the colonoscope was                        passed under direct vision. Throughout the procedure,                        the patient's blood pressure, pulse, and oxygen                        saturations were monitored continuously. The                        Colonoscope was introduced through the anus and  advanced to the the cecum, identified by the                        appendiceal orifice, IC valve and transillumination.                        The colonoscopy was performed with ease. The patient                        tolerated the procedure well.  The quality of the bowel                        preparation was good. Findings:      The perianal and digital rectal examinations were normal.      A 3 mm polyp was found in the ascending colon. The polyp was sessile.       The polyp was removed with a cold biopsy forceps. Resection and       retrieval were complete.      Multiple small-mouthed diverticula were found in the sigmoid colon.      Non-bleeding internal hemorrhoids were found during retroflexion. The       hemorrhoids were medium-sized and Grade I (internal hemorrhoids that do       not prolapse).      The exam was otherwise without abnormality on direct and retroflexion       views. Impression:           - One 3 mm polyp in the ascending colon, removed with a                        cold biopsy forceps. Resected and retrieved.                       - Diverticulosis in the sigmoid colon.                       - Non-bleeding internal hemorrhoids.                       - The examination was otherwise normal on direct and                        retroflexion views. Recommendation:       - Discharge patient to home (with escort).                       - Resume previous diet.                       - Continue present medications.                       - Await pathology results.                       - Repeat colonoscopy in 5-10 years for surveillance                        based on pathology results. Procedure Code(s):    --- Professional ---  45380, Colonoscopy, flexible; with biopsy, single or                        multiple Diagnosis Code(s):    --- Professional ---                       Z12.11, Encounter for screening for malignant neoplasm                        of colon                       K64.0, First degree hemorrhoids                       D12.2, Benign neoplasm of ascending colon                       K57.30, Diverticulosis of large intestine without                        perforation or abscess  without bleeding CPT copyright 2016 American Medical Association. All rights reserved. The codes documented in this report are preliminary and upon coder review may  be revised to meet current compliance requirements. [name], MD [name] MD, MD 11/23/2016 11:27:49 AM This report has been signed electronically. Number of Addenda: 0 Note Initiated On: 11/23/2016 11:06 AM Scope Withdrawal Time: 0 hours 9 minutes 31 seconds  Total Procedure Duration: 0 hours 16 minutes 7 seconds       [facility]

## 2016-11-23 NOTE — Anesthesia Preprocedure Evaluation (Signed)
Anesthesia Evaluation  Patient identified by MRN, date of birth, ID band Patient awake    Reviewed: Allergy & Precautions, H&P , NPO status , Patient's Chart, lab work & pertinent test results  History of Anesthesia Complications (+) PONV, PROLONGED EMERGENCE and history of anesthetic complications  Airway Mallampati: III  TM Distance: <3 FB Neck ROM: limited    Dental  (+) Chipped   Pulmonary neg shortness of breath, former smoker,           Cardiovascular Exercise Tolerance: Good hypertension, (-) angina+ CAD and + Past MI  (-) DOE      Neuro/Psych PSYCHIATRIC DISORDERS Anxiety Depression  Neuromuscular disease    GI/Hepatic negative GI ROS, Neg liver ROS, neg GERD  ,  Endo/Other  negative endocrine ROS  Renal/GU negative Renal ROS  negative genitourinary   Musculoskeletal   Abdominal   Peds  Hematology negative hematology ROS (+)   Anesthesia Other Findings Past Medical History: No date: Anxiety No date: Depression No date: Hyperlipidemia No date: Hypertension  Past Surgical History: No date: ABDOMINAL HYSTERECTOMY No date: BUNIONECTOMY; Bilateral 2009: CORONARY STENT PLACEMENT No date: ELBOW SURGERY; Left No date: HAMMER TOE SURGERY No date: RIB FRACTURE SURGERY No date: WRIST FRACTURE SURGERY  BMI    Body Mass Index:  26.83 kg/m      Reproductive/Obstetrics negative OB ROS                             Anesthesia Physical Anesthesia Plan  ASA: III  Anesthesia Plan: General   Post-op Pain Management:    Induction: Intravenous  PONV Risk Score and Plan: Propofol infusion  Airway Management Planned: Natural Airway and Nasal Cannula  Additional Equipment:   Intra-op Plan:   Post-operative Plan:   Informed Consent: I have reviewed the patients History and Physical, chart, labs and discussed the procedure including the risks, benefits and alternatives for the  proposed anesthesia with the patient or authorized representative who has indicated his/her understanding and acceptance.   Dental Advisory Given  Plan Discussed with: Anesthesiologist, CRNA and Surgeon  Anesthesia Plan Comments: (Patient consented for risks of anesthesia including but not limited to:  - adverse reactions to medications - risk of intubation if required - damage to teeth, lips or other oral mucosa - sore throat or hoarseness - Damage to heart, brain, lungs or loss of life  Patient voiced understanding.)        Anesthesia Quick Evaluation

## 2016-11-23 NOTE — Anesthesia Post-op Follow-up Note (Signed)
Anesthesia QCDR form completed.        

## 2016-11-23 NOTE — Transfer of Care (Signed)
Immediate Anesthesia Transfer of Care Note  Patient: Elizabeth Johnson  Procedure(s) Performed: COLONOSCOPY WITH PROPOFOL (N/A )  Patient Location: Endoscopy Unit  Anesthesia Type:General  Level of Consciousness: awake  Airway & Oxygen Therapy: Patient Spontanous Breathing and Patient connected to nasal cannula oxygen  Post-op Assessment: Report given to RN and Post -op Vital signs reviewed and stable  Post vital signs: Reviewed and stable  Last Vitals:  Vitals:   11/23/16 1014 11/23/16 1130  BP: (!) 145/90   Pulse: (!) 119 (!) 102  Resp: 16 16  Temp: (!) 36.1 C (!) 36.1 C  SpO2: 100% 100%    Last Pain:  Vitals:   11/23/16 1130  TempSrc: Tympanic         Complications: No apparent anesthesia complications

## 2016-11-26 ENCOUNTER — Other Ambulatory Visit: Payer: Self-pay

## 2016-11-26 ENCOUNTER — Encounter: Payer: Self-pay | Admitting: Gastroenterology

## 2016-11-26 LAB — SURGICAL PATHOLOGY

## 2016-11-27 ENCOUNTER — Encounter: Payer: Self-pay | Admitting: Gastroenterology

## 2016-12-05 ENCOUNTER — Ambulatory Visit: Payer: Medicare PPO | Admitting: Family Medicine

## 2017-01-02 ENCOUNTER — Ambulatory Visit
Admission: RE | Admit: 2017-01-02 | Discharge: 2017-01-02 | Disposition: A | Discharge status: Home / Self Care | Payer: Medicare PPO | Source: Ambulatory Visit | Attending: Family Medicine | Admitting: Family Medicine

## 2017-01-02 DIAGNOSIS — Z1231 Encounter for screening mammogram for malignant neoplasm of breast: Secondary | ICD-10-CM | POA: Insufficient documentation

## 2017-01-02 DIAGNOSIS — Z780 Asymptomatic menopausal state: Secondary | ICD-10-CM | POA: Diagnosis not present

## 2017-01-02 DIAGNOSIS — M8589 Other specified disorders of bone density and structure, multiple sites: Secondary | ICD-10-CM | POA: Diagnosis not present

## 2017-01-02 DIAGNOSIS — M8588 Other specified disorders of bone density and structure, other site: Secondary | ICD-10-CM | POA: Diagnosis not present

## 2017-01-02 DIAGNOSIS — Z0000 Encounter for general adult medical examination without abnormal findings: Secondary | ICD-10-CM | POA: Diagnosis not present

## 2017-01-02 DIAGNOSIS — M8580 Other specified disorders of bone density and structure, unspecified site: Secondary | ICD-10-CM | POA: Diagnosis present

## 2017-01-07 ENCOUNTER — Encounter: Payer: Self-pay | Admitting: Family Medicine

## 2017-01-07 ENCOUNTER — Ambulatory Visit: Payer: Medicare PPO | Admitting: Family Medicine

## 2017-01-07 VITALS — BP 114/70 | HR 86 | Resp 14 | Ht 61.0 in | Wt 152.2 lb

## 2017-01-07 DIAGNOSIS — I10 Essential (primary) hypertension: Secondary | ICD-10-CM | POA: Diagnosis not present

## 2017-01-07 DIAGNOSIS — E8881 Metabolic syndrome: Secondary | ICD-10-CM | POA: Diagnosis not present

## 2017-01-07 DIAGNOSIS — F320 Major depressive disorder, single episode, mild: Secondary | ICD-10-CM | POA: Diagnosis not present

## 2017-01-07 DIAGNOSIS — I2510 Atherosclerotic heart disease of native coronary artery without angina pectoris: Secondary | ICD-10-CM

## 2017-01-07 DIAGNOSIS — E785 Hyperlipidemia, unspecified: Secondary | ICD-10-CM | POA: Diagnosis not present

## 2017-01-07 DIAGNOSIS — M25531 Pain in right wrist: Secondary | ICD-10-CM | POA: Diagnosis not present

## 2017-01-07 DIAGNOSIS — I209 Angina pectoris, unspecified: Secondary | ICD-10-CM | POA: Diagnosis not present

## 2017-01-07 MED ORDER — CITALOPRAM HYDROBROMIDE 20 MG PO TABS: 20.0000 mg | ORAL_TABLET | Freq: Every day | ORAL | 1 refills | Status: DC

## 2017-01-07 NOTE — Progress Notes (Signed)
Name: Elizabeth Johnson   MRN: [id number]    DOB: 1950-12-23   Date:01/07/2017       Progress Note  Subjective  Chief Complaint  Chief Complaint  Patient presents with  . Hypertension  . Hyperlipidemia  . Depression  . Results    mammogramm, colonoscopy and bone density results    HPI   Numbness hands: she states symptoms started over one year ago, initially was sporadic and just a tingling on 2nd, 3rd and 4th fingers right hand , however over the past 10 months it has been numb  constantly, worse during sleep and wakes up to shake her right hand but is not even helping anymore. She also noticed swelling on dorsal aspect of right wrist that is getting progressively worse  Hyperlipidemia: she has been taking Rosuvastatin daily and last LDL was  140 and has a history of heart attack, s/ p stent, discussed need to drop LDL and discussed PCSK9 and she will check coverage with pharmacy.   HTN: patient is doing well, no chest pain , palpitation or SOB. She denies any recent dizziness.   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 She states finds herself feeling sad more often, she is on Wellbutrin, has some crying spells, affecting her sleep, still able to function and doing ADL. She agrees on adding Citalopram to her regiment.   Back pain: she has mild low back pain,  Flexeril qhs prn only. Described as spasms, no radiculitis  Insomnia: she takes alprazolam very seldom and needs a refill, last filled 09/2016, she does not want to take anything every night for sleep.   Osteopenia: discussed results, she has enough dairy in her diet, she prefers not starting medication and FRAX score was low.   Angina pectoris: history of MI, denies recent chest pain, has NTG at home, on beta-blocker, statin therapy and ACE, also takes aspirin   Patient Active Problem List   Diagnosis Date Noted  . Biceps  tendon rupture 09/21/2015  . H/O hysterectomy for benign disease 09/02/2014  . Acquired bilateral hammer toes 09/02/2014  . External hemorrhoid 09/02/2014  . Allergic rhinitis 07/19/2014  . Carpal tunnel syndrome on right 07/19/2014  . Insomnia, persistent 07/19/2014  . Chronic LBP 07/19/2014  . Mild major depression ([place]) 07/19/2014  . Dyslipidemia 07/19/2014  . Hemorrhoid 07/19/2014  . Dysmetabolic syndrome 33/54/5625  . 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 Osteopenia 07/19/2014  . Benign essential HTN 02/04/2009  . H/O acute myocardial infarction of lateral wall 07/18/2004    Past Surgical History:  Procedure Laterality Date  . ABDOMINAL HYSTERECTOMY    . BUNIONECTOMY Bilateral   . COLONOSCOPY WITH PROPOFOL N/A 11/23/2016   Procedure: COLONOSCOPY WITH PROPOFOL;  Surgeon: [name], MD;  Location: [facility] ENDOSCOPY;  Service: Gastroenterology;  Laterality: N/A;  . CORONARY STENT PLACEMENT  2009  . ELBOW SURGERY Left   . HAMMER TOE SURGERY    . RIB FRACTURE SURGERY    . WRIST FRACTURE SURGERY      Family History  Problem Relation Age of Onset  . Alzheimer's disease Mother   . Breast cancer Mother 8  . CAD Father   . Heart attack Father   . Drug abuse Sister   . Suicidality Sister   . Heart attack Brother     Social History   Socioeconomic History  . Marital  status: Married    Spouse name: Not on file  . Number of children: 1  . Years of education: Not on file  . Highest education level: Not on file  Social Needs  . Financial resource strain: Not on file  . Food insecurity - worry: Not on file  . Food insecurity - inability: Not on file  . Transportation needs - medical: Not on file  . Transportation needs - non-medical: Not on file  Occupational History  . Occupation: retired   Tobacco Use  . Smoking status: Former Smoker    Years: 8.00    Types: Cigarettes    Start date: 01/23/1999    Last attempt to quit: 05/23/2007     Years since quitting: 9.6  . Smokeless tobacco: Never Used  Substance and Sexual Activity  . Alcohol use: Yes    Alcohol/week: 0.0 oz    Comment: occasional  . Drug use: No  . Sexual activity: Yes    Partners: Male  Other Topics Concern  . Not on file  Social History Narrative   She worked for the same company for 20 years and they closed [place] branch and she was forced to retire Fall 2017.    Married      Current Outpatient Medications:  .  ALPRAZolam (XANAX) 0.5 MG tablet, Take 1 tablet (0.5 mg total) by mouth as needed., Disp: 30 tablet, Rfl: 0 .  aspirin 81 MG chewable tablet, Chew 1 tablet by mouth daily., Disp: , Rfl:  .  buPROPion (WELLBUTRIN XL) 300 MG 24 hr tablet, Take 1 tablet (300 mg total) by mouth daily., Disp: 90 tablet, Rfl: 1 .  carvedilol (COREG) 6.25 MG tablet, Take 1 tablet (6.25 mg total) by mouth 2 (two) times daily with a meal., Disp: 180 tablet, Rfl: 1 .  lisinopril (PRINIVIL,ZESTRIL) 20 MG tablet, Take 1 tablet (20 mg total) by mouth daily., Disp: 90 tablet, Rfl: 1 .  Multiple Vitamins-Minerals (CENTRUM SILVER 50+WOMEN) TABS, Take 0.5 tablets by mouth daily., Disp: , Rfl:  .  nitroGLYCERIN (NITROSTAT) 0.4 MG SL tablet, Place 1 tablet (0.4 mg total) under the tongue every 5 (five) minutes as needed for chest pain., Disp: 10 tablet, Rfl: 2 .  pravastatin (PRAVACHOL) 40 MG tablet, Take 1 tablet (40 mg total) by mouth daily., Disp: 90 tablet, Rfl: 1 .  ACCU-CHEK SMARTVIEW test strip, USE AS INSTRUCTED (Patient not taking: Reported on 01/07/2017), Disp: 200 each, Rfl: 2 .  Alcohol Swabs (B-D SINGLE USE SWABS REGULAR) PADS, 1 each by Does not apply route 2 (two) times daily. (Patient not taking: Reported on 01/07/2017), Disp: 200 each, Rfl: 1 .  Blood Glucose Calibration (ACCU-CHEK SMARTVIEW CONTROL) LIQD, 1 each by In Vitro route daily. (Patient not taking: Reported on 01/07/2017), Disp: 200 each, Rfl: 1 .  Blood Glucose Monitoring Suppl (ACCU-CHEK NANO SMARTVIEW)  w/Device KIT, 1 kit by Does not apply route daily. (Patient not taking: Reported on 01/07/2017), Disp: 1 kit, Rfl: 0 .  citalopram (CELEXA) 20 MG tablet, Take 1 tablet (20 mg total) by mouth daily., Disp: 30 tablet, Rfl: 1 .  Lancets Misc. (ACCU-CHEK SOFTCLIX LANCET DEV) KIT, , Disp: , Rfl:   Allergies  Allergen Reactions  . Codeine Other (See Comments)    Sensitivity  . Crestor [Rosuvastatin Calcium] Other (See Comments)    Muscle cramp     ROS  Constitutional: Negative for fever or weight change.  Respiratory: Negative for cough and shortness of breath.   Cardiovascular: Negative  for chest pain or palpitations.  Gastrointestinal: Negative for abdominal pain, no bowel changes.  Musculoskeletal: Negative for gait problem or joint swelling.  Skin: Negative for rash.  Neurological: Negative for dizziness or headache.  No other specific complaints in a complete review of systems (except as listed in HPI above).  Objective  Vitals:   01/07/17 0847  BP: 114/70  Pulse: 86  Resp: 14  SpO2: 96%  Weight: 152 lb 3.2 oz (69 kg)  Height: '5\' 1"'  (1.549 m)    Body mass index is 28.76 kg/m.  Physical Exam  Constitutional: Patient appears well-developed and well-nourished. Obese No distress.  HEENT: head atraumatic, normocephalic, pupils equal and reactive to light,  neck supple, throat within normal limits Cardiovascular: Normal rate, regular rhythm and normal heart sounds.  No murmur heard. No BLE edema. Pulmonary/Chest: Effort normal and breath sounds normal. No respiratory distress. Abdominal: Soft.  There is no tenderness. Psychiatric: Patient has a normal mood and affect. behavior is normal. Judgment and thought content normal. Muscular Skeletal: right wrist is swollen ( bony prominence) dorsal aspect, no redness or increase in warmth, tingling on middle fingers, decrease rom of right wrist  Recent Results (from the past 2160 hour(s))  CBC with Differential/Platelet     Status:  Abnormal   Collection Time: 10/16/16 12:15 PM  Result Value Ref Range   WBC 8.8 3.8 - 10.8 Thousand/uL   RBC 4.86 3.80 - 5.10 Million/uL   Hemoglobin 14.0 11.7 - 15.5 g/dL   HCT 42.1 35.0 - 45.0 %   MCV 86.6 80.0 - 100.0 fL   MCH 28.8 27.0 - 33.0 pg   MCHC 33.3 32.0 - 36.0 g/dL   RDW 12.5 11.0 - 15.0 %   Platelets 330 140 - 400 Thousand/uL   MPV 9.0 7.5 - 12.5 fL   Neutro Abs 5,016 1,500 - 7,800 cells/uL   Lymphs Abs 2,552 850 - 3,900 cells/uL   WBC mixed population 986 (H) 200 - 950 cells/uL   Eosinophils Absolute 185 15 - 500 cells/uL   Basophils Absolute 62 0 - 200 cells/uL   Neutrophils Relative % 57 %   Total Lymphocyte 29.0 %   Monocytes Relative 11.2 %   Eosinophils Relative 2.1 %   Basophils Relative 0.7 %  COMPLETE METABOLIC PANEL WITH GFR     Status: Abnormal   Collection Time: 10/16/16 12:15 PM  Result Value Ref Range   Glucose, Bld 121 (H) 65 - 99 mg/dL    Comment: .            Fasting reference interval . For someone without known diabetes, a glucose value between 100 and 125 mg/dL is consistent with prediabetes and should be confirmed with a follow-up test. .    BUN 11 7 - 25 mg/dL   Creat 0.75 0.50 - 0.99 mg/dL    Comment: For patients >56 years of age, the reference limit for Creatinine is approximately 13% higher for people identified as African-American. .    GFR, Est Non African American 84 > OR = 60 mL/min/1.76m   GFR, Est African American 97 > OR = 60 mL/min/1.753m  BUN/Creatinine Ratio NOT APPLICABLE 6 - 22 (calc)   Sodium 137 135 - 146 mmol/L   Potassium 4.7 3.5 - 5.3 mmol/L   Chloride 101 98 - 110 mmol/L   CO2 27 20 - 32 mmol/L   Calcium 9.5 8.6 - 10.4 mg/dL   Total Protein 7.3 6.1 - 8.1 g/dL   Albumin  4.5 3.6 - 5.1 g/dL   Globulin 2.8 1.9 - 3.7 g/dL (calc)   AG Ratio 1.6 1.0 - 2.5 (calc)   Total Bilirubin 0.5 0.2 - 1.2 mg/dL   Alkaline phosphatase (APISO) 81 33 - 130 U/L   AST 21 10 - 35 U/L   ALT 20 6 - 29 U/L  Hemoglobin A1c      Status: Abnormal   Collection Time: 10/16/16 12:15 PM  Result Value Ref Range   Hgb A1c MFr Bld 5.9 (H) <5.7 % of total Hgb    Comment: For someone without known diabetes, a hemoglobin  A1c value between 5.7% and 6.4% is consistent with prediabetes and should be confirmed with a  follow-up test. . For someone with known diabetes, a value <7% indicates that their diabetes is well controlled. A1c targets should be individualized based on duration of diabetes, age, comorbid conditions, and other considerations. . This assay result is consistent with an increased risk of diabetes. . Currently, no consensus exists regarding use of hemoglobin A1c for diagnosis of diabetes for children. .    Mean Plasma Glucose 123 (calc)   eAG (mmol/L) 6.8 (calc)  Lipid panel     Status: Abnormal   Collection Time: 10/16/16 12:15 PM  Result Value Ref Range   Cholesterol 238 (H) <200 mg/dL   HDL 54 >50 mg/dL   Triglycerides 284 (H) <150 mg/dL   LDL Cholesterol (Calc) 140 (H) mg/dL (calc)    Comment: Reference range: <100 . Desirable range <100 mg/dL for primary prevention;   <70 mg/dL for patients with CHD or diabetic patients  with > or = 2 CHD risk factors. Marland Kitchen LDL-C is now calculated using the Martin-Hopkins  calculation, which is a validated novel method providing  better accuracy than the Friedewald equation in the  estimation of LDL-C.  Cresenciano Genre et al. Annamaria Helling. 8675;449(20): 2061-2068  (http://education.[facility].com/faq/FAQ164)    Total CHOL/HDL Ratio 4.4 <5.0 (calc)   Non-HDL Cholesterol (Calc) 184 (H) <130 mg/dL (calc)    Comment: For patients with diabetes plus 1 major ASCVD risk  factor, treating to a non-HDL-C goal of <100 mg/dL  (LDL-C of <70 mg/dL) is considered a therapeutic  option.   Surgical pathology     Status: None   Collection Time: 11/23/16 11:18 AM  Result Value Ref Range   SURGICAL PATHOLOGY      Surgical Pathology CASE: [id number] PATIENT: Elna Breslow Surgical Pathology Report     SPECIMEN SUBMITTED: A. Colon polyp, ascending; cbx  CLINICAL HISTORY: None provided  PRE-OPERATIVE DIAGNOSIS: Screening colonoscopy, Z12.11  POST-OPERATIVE DIAGNOSIS: Colon polyps, diverticulosis     DIAGNOSIS: A.  COLON POLYP, ASCENDING; COLD BIOPSY: - TUBULAR ADENOMA. - NEGATIVE FOR HIGH-GRADE DYSPLASIA AND MALIGNANCY.   GROSS DESCRIPTION:  A. Labeled: C BX ascending colon polyp  Tissue fragment(s): 2  Size: 0.2-0.3 cm  Description: pink-tan fragments  Entirely submitted in 1 cassette(s).    Final Diagnosis performed by Delorse Lek, MD.  Electronically signed 11/26/2016 11:25:59AM    The electronic signature indicates that the named Attending Pathologist has evaluated the specimen  Technical component performed at [facility], [address], [place], [place] [address] Lab: [phone] Dir: Rush Farmer, MD, MMM  Professional component performed  at [facility], [facility], [place], [place],  [address] Lab: [phone] Dir: Dellia Nims. Rubinas, MD        PHQ2/9: Depression screen [facility] 2/9 01/07/2017 10/16/2016 09/21/2015 01/06/2015 08/09/2014  Decreased Interest 2 1 0 0 0  Down, Depressed, Hopeless 2 1 0 0 0  PHQ - 2 Score 4 2 0 0 0  Altered sleeping 2 3 - - -  Tired, decreased energy 3 2 - - -  Change in appetite 0 0 - - -  Feeling bad or failure about yourself  2 3 - - -  Trouble concentrating 0 0 - - -  Moving slowly or fidgety/restless 0 0 - - -  Suicidal thoughts 0 0 - - -  PHQ-9 Score 11 10 - - -  Difficult doing work/chores Somewhat difficult Somewhat difficult - - -     Fall Risk: Fall Risk  01/07/2017 10/16/2016 09/21/2015 01/06/2015 08/09/2014  Falls in the past year? No Yes No No No  Number falls in past yr: - 1 - - -  Injury with Fall? - No - - -     Functional Status Survey: Is the patient deaf or have difficulty hearing?: No Does the patient have difficulty  seeing, even when wearing glasses/contacts?: No Does the patient have difficulty concentrating, remembering, or making decisions?: No Does the patient have difficulty walking or climbing stairs?: No Does the patient have difficulty dressing or bathing?: No Does the patient have difficulty doing errands alone such as visiting a doctor's office or shopping?: No    Assessment & Plan  1. Benign essential HTN  At goal, still has medication at home  2. Dyslipidemia  On Rosuvastatin, last LDL still above goal , discussed PCSK9   3. Coronary artery disease involving left main coronary artery  On NTG prn   4. Dysmetabolic syndrome  On life style medication  5. Mild major depression ([facility])  Continue Wellbutrin XL  We will add Citalopram daily   6. Right wrist pain  Get referral to Ortho  7. Angina pectoris syndrome (HCC)  No recent episodes  6. Right wrist pain  Get referral to Ortho

## 2017-01-07 NOTE — Patient Instructions (Signed)
Evolocumab injection What is this medicine? EVOLOCUMAB (e voe LOK ue mab) is known as a PCSK9 inhibitor. It is used to lower the level of cholesterol in the blood. It may be used alone or in combination with other cholesterol-lowering drugs. This drug may also be used to reduce the risk of heart attack, stroke, and certain types of heart surgery in patients with heart disease. This medicine may be used for other purposes; ask your health care provider or pharmacist if you have questions. COMMON BRAND NAME(S): REPATHA What should I tell my health care provider before I take this medicine? They need to know if you have any of these conditions: -an unusual or allergic reaction to evolocumab, other medicines, foods, dyes, or preservatives -pregnant or trying to get pregnant -breast-feeding How should I use this medicine? This medicine is for injection under the skin. You will be taught how to prepare and give this medicine. Use exactly as directed. Take your medicine at regular intervals. Do not take your medicine more often than directed. It is important that you put your used needles and syringes in a special sharps container. Do not put them in a trash can. If you do not have a sharps container, call your pharmacist or health care provider to get one. Talk to your pediatrician regarding the use of this medicine in children. While this drug may be prescribed for children as young as 13 years for selected conditions, precautions do apply. Overdosage: If you think you have taken too much of this medicine contact a poison control center or emergency room at once. NOTE: This medicine is only for you. Do not share this medicine with others. What if I miss a dose? If you miss a dose, take it as soon as you can if there are more than 7 days until the next scheduled dose, or skip the missed dose and take the next dose according to your original schedule. Do not take double or extra doses. What may interact  with this medicine? Interactions are not expected. This list may not describe all possible interactions. Give your health care provider a list of all the medicines, herbs, non-prescription drugs, or dietary supplements you use. Also tell them if you smoke, drink alcohol, or use illegal drugs. Some items may interact with your medicine. What should I watch for while using this medicine? You may need blood work while you are taking this medicine. What side effects may I notice from receiving this medicine? Side effects that you should report to your doctor or health care professional as soon as possible: -allergic reactions like skin rash, itching or hives, swelling of the face, lips, or tongue -signs and symptoms of infection like fever or chills; cough; sore throat; pain or trouble passing urine Side effects that usually do not require medical attention (report to your doctor or health care professional if they continue or are bothersome): -diarrhea -nausea -muscle pain -pain, redness, or irritation at site where injected This list may not describe all possible side effects. Call your doctor for medical advice about side effects. You may report side effects to FDA at 1-800-FDA-1088. Where should I keep my medicine? Keep out of the reach of children. You will be instructed on how to store this medicine. Throw away any unused medicine after the expiration date on the label. NOTE: This sheet is a summary. It may not cover all possible information. If you have questions about this medicine, talk to your doctor, pharmacist, or health care   provider.  2018 Elsevier/Gold Standard (2015-12-26 13:21:53)  

## 2017-01-08 DIAGNOSIS — G5601 Carpal tunnel syndrome, right upper limb: Secondary | ICD-10-CM | POA: Diagnosis not present

## 2017-01-11 ENCOUNTER — Other Ambulatory Visit: Payer: Self-pay | Admitting: *Deleted

## 2017-01-11 ENCOUNTER — Inpatient Hospital Stay
Admission: RE | Admit: 2017-01-11 | Discharge: 2017-01-11 | Disposition: A | Discharge status: Home / Self Care | Payer: Self-pay | Source: Ambulatory Visit | Attending: *Deleted | Admitting: *Deleted

## 2017-01-11 DIAGNOSIS — Z9289 Personal history of other medical treatment: Secondary | ICD-10-CM

## 2017-02-19 ENCOUNTER — Ambulatory Visit: Payer: Medicare PPO | Admitting: Family Medicine

## 2017-02-28 DIAGNOSIS — G5621 Lesion of ulnar nerve, right upper limb: Secondary | ICD-10-CM | POA: Diagnosis not present

## 2017-02-28 DIAGNOSIS — G5601 Carpal tunnel syndrome, right upper limb: Secondary | ICD-10-CM | POA: Diagnosis not present

## 2017-03-08 ENCOUNTER — Telehealth: Payer: Self-pay | Admitting: Family Medicine

## 2017-03-08 NOTE — Telephone Encounter (Signed)
Ria Comment, CMA with [facility] called to receive the most recent BUN/Creatinine, noted in the chart 10/17/16 11/0.75, result given, she read back.

## 2017-03-12 ENCOUNTER — Other Ambulatory Visit: Payer: Self-pay | Admitting: Family Medicine

## 2017-03-12 DIAGNOSIS — I10 Essential (primary) hypertension: Secondary | ICD-10-CM

## 2017-03-12 DIAGNOSIS — I2510 Atherosclerotic heart disease of native coronary artery without angina pectoris: Secondary | ICD-10-CM

## 2017-03-12 DIAGNOSIS — F320 Major depressive disorder, single episode, mild: Secondary | ICD-10-CM

## 2017-03-12 NOTE — Telephone Encounter (Signed)
Hypertension medication request: Lisinopril and Carvedilol  Last office visit pertaining to hypertension:  BP Readings from Last 3 Encounters:  01/07/17 114/70  11/23/16 118/79  10/16/16 112/60    Lab Results  Component Value Date   CREATININE 0.75 10/16/2016   BUN 11 10/16/2016   NA 137 10/16/2016   K 4.7 10/16/2016   CL 101 10/16/2016   CO2 27 10/16/2016    Refill Request for Cholesterol medication. Pravastain  Last physical:  Lab Results  Component Value Date   CHOL 238 (H) 10/16/2016   HDL 54 10/16/2016   LDLCALC 203 (H) 09/21/2015   TRIG 284 (H) 10/16/2016   CHOLHDL 4.4 10/16/2016    No Follow-up on file.

## 2017-03-12 NOTE — Telephone Encounter (Signed)
LFT message pt needs appt for med refill before dr can refill them.

## 2017-03-12 NOTE — Telephone Encounter (Signed)
She needs to schedule follow up, before I send refills It does not need to be right away

## 2017-03-18 DIAGNOSIS — G5601 Carpal tunnel syndrome, right upper limb: Secondary | ICD-10-CM | POA: Diagnosis not present

## 2017-03-22 ENCOUNTER — Other Ambulatory Visit: Payer: Self-pay | Admitting: Family Medicine

## 2017-03-22 DIAGNOSIS — G4709 Other insomnia: Secondary | ICD-10-CM

## 2017-03-22 MED ORDER — ALPRAZOLAM 0.5 MG PO TABS: 0.5000 mg | ORAL_TABLET | ORAL | 0 refills | Status: DC | PRN

## 2017-03-22 NOTE — Telephone Encounter (Signed)
Copied from [place] [id number]. Topic: Quick Communication - Rx Refill/Question >> Mar 22, 2017 10:45 AM Ahmed Prima L wrote: Medication:  ALPRAZolam Duanne Moron) 0.5 MG tablet  Has the patient contacted their pharmacy? yes   (Agent: If no, request that the patient contact the pharmacy for the refill.)   Preferred Pharmacy (with phone number or street name): [place], [place] said she is having a hard time bc she is getting ready to have surgery & her dog just had a heart attack, was advised to call when she would want this refilled again   Agent: Please be advised that RX refills may take up to 3 business days. We ask that you follow-up with your pharmacy.

## 2017-03-28 DIAGNOSIS — G5601 Carpal tunnel syndrome, right upper limb: Secondary | ICD-10-CM | POA: Diagnosis not present

## 2017-04-03 ENCOUNTER — Encounter: Payer: Self-pay | Admitting: Family Medicine

## 2017-04-03 ENCOUNTER — Ambulatory Visit: Payer: Medicare PPO | Admitting: Family Medicine

## 2017-04-03 VITALS — BP 110/70 | HR 98 | Temp 98.3°F | Resp 16 | Ht 61.0 in | Wt 151.0 lb

## 2017-04-03 DIAGNOSIS — I252 Old myocardial infarction: Secondary | ICD-10-CM

## 2017-04-03 DIAGNOSIS — I2510 Atherosclerotic heart disease of native coronary artery without angina pectoris: Secondary | ICD-10-CM | POA: Diagnosis not present

## 2017-04-03 DIAGNOSIS — I10 Essential (primary) hypertension: Secondary | ICD-10-CM | POA: Diagnosis not present

## 2017-04-03 DIAGNOSIS — E785 Hyperlipidemia, unspecified: Secondary | ICD-10-CM

## 2017-04-03 DIAGNOSIS — E8881 Metabolic syndrome: Secondary | ICD-10-CM

## 2017-04-03 DIAGNOSIS — Z01818 Encounter for other preprocedural examination: Secondary | ICD-10-CM

## 2017-04-03 NOTE — Progress Notes (Addendum)
Name: Elizabeth Johnson   MRN: [id number]    DOB: 08/19/1950   Date:04/03/2017       Progress Note  Subjective  Chief Complaint  Chief Complaint  Patient presents with  . Hand Pain    Surgical Clearance right hand for Carpal tunnel on March 25th Dr. Sabra Heck [facility]    HPI  Pt presents for surgical clearance requested by Dr. Sabra Heck at [facility] for Carpal Tunnel Release (Same Day Surgery).    Pt is former smoker: quit smoking 10 years ago; has 5 pack year history. BMI: 28.53, does not snore, has never had sleep study nor has she been diagnosed with sleep apnea.  Last A1C was 5.9%. Cardiac: History MI in 2010; has not seen a cardiologist in at least 9 years. ECG from 10/16/2016 was NSR.  Denies chest pain or shortness of breath.  Taking 70m ASA daily, will hold aspirin for 5 days prior to surgery.  Taking pravastatin daily as well.  Has nitroglycerin prescribed but has never had to use it.  Able to walk up a flight of stairs without becoming short of breath.  Patient Active Problem List   Diagnosis Date Noted  . Biceps tendon rupture 09/21/2015  . H/O hysterectomy for benign disease 09/02/2014  . Acquired bilateral hammer toes 09/02/2014  . External hemorrhoid 09/02/2014  . Allergic rhinitis 07/19/2014  . Carpal tunnel syndrome on right 07/19/2014  . Insomnia, persistent 07/19/2014  . Chronic LBP 07/19/2014  . Mild major depression ([place] 07/19/2014  . Dyslipidemia 07/19/2014  . Hemorrhoid 07/19/2014  . Dysmetabolic syndrome 055/37/4827 . 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 Osteopenia 07/19/2014  . Benign essential HTN 02/04/2009  . H/O acute myocardial infarction of lateral wall 07/18/2004    Social History   Tobacco Use  . Smoking status: Former Smoker    Years: 8.00    Types: Cigarettes    Start date: 01/23/1999    Last attempt to quit: 05/23/2007    Years since quitting: 9.8  . Smokeless tobacco: Never Used  Substance Use  Topics  . Alcohol use: Yes    Alcohol/week: 0.0 oz    Comment: occasional     Current Outpatient Medications:  .  ACCU-CHEK SMARTVIEW test strip, USE AS INSTRUCTED, Disp: 200 each, Rfl: 2 .  Alcohol Swabs (B-D SINGLE USE SWABS REGULAR) PADS, 1 each by Does not apply route 2 (two) times daily., Disp: 200 each, Rfl: 1 .  ALPRAZolam (XANAX) 0.5 MG tablet, Take 1 tablet (0.5 mg total) by mouth as needed. (Patient taking differently: Take 0.25 mg by mouth daily as needed for anxiety. ), Disp: 5 tablet, Rfl: 0 .  aspirin 81 MG chewable tablet, Chew 81 mg by mouth daily. , Disp: , Rfl:  .  Blood Glucose Calibration (ACCU-CHEK SMARTVIEW CONTROL) LIQD, 1 each by In Vitro route daily., Disp: 200 each, Rfl: 1 .  Blood Glucose Monitoring Suppl (ACCU-CHEK NANO SMARTVIEW) w/Device KIT, 1 kit by Does not apply route daily., Disp: 1 kit, Rfl: 0 .  buPROPion (WELLBUTRIN XL) 300 MG 24 hr tablet, Take 1 tablet (300 mg total) by mouth daily., Disp: 90 tablet, Rfl: 1 .  carvedilol (COREG) 6.25 MG tablet, Take 1 tablet (6.25 mg total) by mouth 2 (two) times daily with a meal., Disp: 180 tablet, Rfl: 1 .  cyclobenzaprine (FLEXERIL) 10 MG tablet, Take 5 mg by mouth 3 (three) times daily as needed for muscle spasms., Disp: , Rfl:  .  ibuprofen (ADVIL,MOTRIN) 200 MG tablet, Take 400 mg by mouth every 6 (six) hours as needed for headache or moderate pain., Disp: , Rfl:  .  Lancets Misc. (ACCU-CHEK SOFTCLIX LANCET DEV) KIT, , Disp: , Rfl:  .  lisinopril (PRINIVIL,ZESTRIL) 20 MG tablet, Take 1 tablet (20 mg total) by mouth daily., Disp: 90 tablet, Rfl: 1 .  nitroGLYCERIN (NITROSTAT) 0.4 MG SL tablet, Place 1 tablet (0.4 mg total) under the tongue every 5 (five) minutes as needed for chest pain., Disp: 10 tablet, Rfl: 2 .  Omega-3 Fatty Acids (FISH OIL PO), Take 1 capsule by mouth daily., Disp: , Rfl:  .  pravastatin (PRAVACHOL) 40 MG tablet, Take 1 tablet (40 mg total) by mouth daily., Disp: 90 tablet, Rfl: 1 .   citalopram (CELEXA) 20 MG tablet, Take 1 tablet (20 mg total) by mouth daily. (Patient not taking: Reported on 04/03/2017), Disp: 30 tablet, Rfl: 1  Allergies  Allergen Reactions  . Codeine Other (See Comments)    Sensitivity  . Crestor [Rosuvastatin Calcium] Other (See Comments)    Muscle cramp    ROS  Constitutional: Negative for fever or weight change.  Respiratory: Negative for cough and shortness of breath.   Cardiovascular: Negative for chest pain or palpitations.  Gastrointestinal: Negative for abdominal pain, no bowel changes.  Musculoskeletal: Negative for gait problem or joint swelling.  Skin: Negative for rash.  Neurological: Negative for dizziness or headache.  No other specific complaints in a complete review of systems (except as listed in HPI above).  Objective  Vitals:   04/03/17 0953  BP: 110/70  Pulse: 98  Resp: 16  Temp: 98.3 F (36.8 C)  TempSrc: Oral  SpO2: 96%  Weight: 151 lb (68.5 kg)  Height: '5\' 1"'  (1.549 m)   Body mass index is 28.53 kg/m.  Nursing Note and Vital Signs reviewed.  Physical Exam  Constitutional: Patient appears well-developed and well-nourished. Obese No distress.  HEENT: head atraumatic, normocephalic, neck supple without lymphadenopathy, oropharynx pink and moist without exudate Cardiovascular: Normal rate, regular rhythm, S1/S2 present.  No murmur or rub heard. No BLE edema. Pulmonary/Chest: Effort normal and breath sounds clear. No respiratory distress or retractions. Abdominal: Soft and non-tender, bowel sounds present x4 quadrants. Psychiatric: Patient has a normal mood and affect. behavior is normal. Judgment and thought content normal. Musculoskeletal: Normal range of motion, no joint effusions. No gross deformities Neurological: he is alert and oriented to person, place, and time. No cranial nerve deficit. Coordination, balance, strength, speech and gait are normal.  Skin: Skin is warm and dry. No rash noted. No  erythema.   No results found for this or any previous visit (from the past 72 hour(s)).  Assessment & Plan  1. Pre-operative examination - Pt low risk for Carpal Tunnel surgery - A1C was well controlled in September 2019 at 5.9% - CMP showed albumin of 4.5 - CBC showed Hgb of 14.0  2. Benign essential HTN - Well controlled, continue medications as prescribed  3. Coronary artery disease involving left main coronary artery - Stop aspirin 5 days prior, restart 2 days post-op  4. Dyslipidemia - Continue prevastatin  5. H/O acute myocardial infarction of lateral wall - Stable - Stop aspirin 5 days prior, restart 2 days post-op - She has been without symptoms or concerns since her MI 10 years ago.  6. Dysmetabolic syndrome - Stable, last A1C was 5.9%.

## 2017-04-03 NOTE — Patient Instructions (Signed)
Hold Aspirin for 5 days before, restart 2 days after surgery.

## 2017-04-09 ENCOUNTER — Other Ambulatory Visit: Payer: Self-pay | Admitting: Specialist

## 2017-04-09 ENCOUNTER — Other Ambulatory Visit: Payer: Self-pay | Admitting: Family Medicine

## 2017-04-09 ENCOUNTER — Encounter
Admission: RE | Admit: 2017-04-09 | Discharge: 2017-04-09 | Disposition: A | Discharge status: Home / Self Care | Payer: Medicare PPO | Source: Ambulatory Visit | Attending: Specialist | Admitting: Specialist

## 2017-04-09 ENCOUNTER — Other Ambulatory Visit: Payer: Self-pay

## 2017-04-09 DIAGNOSIS — Z01812 Encounter for preprocedural laboratory examination: Secondary | ICD-10-CM | POA: Diagnosis not present

## 2017-04-09 DIAGNOSIS — G4709 Other insomnia: Secondary | ICD-10-CM

## 2017-04-09 HISTORY — DX: Unspecified osteoarthritis, unspecified site: M19.90

## 2017-04-09 HISTORY — DX: Acute myocardial infarction, unspecified: I21.9

## 2017-04-09 HISTORY — DX: Atherosclerotic heart disease of native coronary artery without angina pectoris: I25.10

## 2017-04-09 LAB — BASIC METABOLIC PANEL
Anion gap: 10 (ref 5–15)
BUN: 13 mg/dL (ref 6–20)
CALCIUM: 9.4 mg/dL (ref 8.9–10.3)
CO2: 23 mmol/L (ref 22–32)
Chloride: 105 mmol/L (ref 101–111)
Creatinine, Ser: 0.71 mg/dL (ref 0.44–1.00)
GFR calc Af Amer: >60 mL/min (ref >60)
Glucose, Bld: 125 mg/dL — ABNORMAL HIGH (ref 65–99)
POTASSIUM: 4.3 mmol/L (ref 3.5–5.1)
Sodium: 138 mmol/L (ref 135–145)

## 2017-04-09 LAB — CBC WITH DIFFERENTIAL/PLATELET
Basophils Absolute: 0 10*3/uL (ref 0–0.1)
Basophils Relative: 0 %
EOS ABS: 0.2 10*3/uL (ref 0–0.7)
EOS PCT: 3 %
HCT: 42.7 % (ref 35.0–47.0)
Hemoglobin: 13.9 g/dL (ref 12.0–16.0)
LYMPHS ABS: 2.2 10*3/uL (ref 1.0–3.6)
Lymphocytes Relative: 25 %
MCH: 28.4 pg (ref 26.0–34.0)
MCHC: 32.6 g/dL (ref 32.0–36.0)
MCV: 87 fL (ref 80.0–100.0)
MONO ABS: 0.8 10*3/uL (ref 0.2–0.9)
Monocytes Relative: 9 %
Neutro Abs: 5.8 10*3/uL (ref 1.4–6.5)
Neutrophils Relative %: 63 %
PLATELETS: 309 10*3/uL (ref 150–440)
RBC: 4.9 MIL/uL (ref 3.80–5.20)
RDW: 13.4 % (ref 11.5–14.5)
WBC: 9.2 10*3/uL (ref 3.6–11.0)

## 2017-04-09 MED ORDER — LACTATED RINGERS IV SOLN: INTRAVENOUS | Status: DC

## 2017-04-09 NOTE — Telephone Encounter (Signed)
Copied from [place]. Topic: Quick Communication - Rx Refill/Question >> Apr 09, 2017 12:33 PM Lysle Morales L, NT wrote: Medication: ALPRAZolam (XANAX) 0.5 MG tablet and also  cyclobenzaprine (FLEXERIL) 10 MG tablet  Has the patient contacted their pharmacy? {yes  (Agent: If no, request that the patient contact the pharmacy for the refill Preferred Pharmacy (with phone number or street name): [place], [place] [phone] (Phone) [phone] (Fax Agent: Please be advised that RX refills may take up to 3 business days. We ask that you follow-up with your pharmacy.

## 2017-04-09 NOTE — Telephone Encounter (Signed)
Copied from [place]. Topic: Quick Communication - Rx Refill/Question >> Apr 09, 2017 12:33 PM Cecelia Byars, NT wrote: Medication: ALPRAZolam Duanne Moron) 0.5 MG tablet Has the patient contacted their pharmacy? {yes  (Agent: If no, request that the patient contact the pharmacy for the refill Preferred Pharmacy (with phone number or street name): [place], [place] - [address] [phone] (Phone) [phone] (Fax Agent: Please be advised that RX refills may take up to 3 business days. We ask that you follow-up with your pharmacy.

## 2017-04-09 NOTE — Patient Instructions (Signed)
Your procedure is scheduled on: Monday, MARCH 25TH  Report to [facility]  To find out your arrival time please call [phone] between 1PM - 3PM on Friday, MARCH 22ND  Remember: Instructions that are not followed completely may result in serious  medical risk, up to and including death, or upon the discretion of your surgeon  and anesthesiologist your surgery may need to be rescheduled.     _X__ 1. Do not eat food after midnight the night before your procedure.                 No gum chewing or hard candies.                   You may drink clear liquids up to 2 hours                 before you are scheduled to arrive for your surgery-                   DO not drink clear                 liquids within 2 hours of the start of your surgery.                  Clear Liquids include:  water, apple juice without pulp, clear carbohydrate                 drink such as Clearfast of Gatorade, Black Coffee or Tea (Do not add                 anything to coffee or tea).  __X__2.  On the morning of surgery brush your teeth with toothpaste and water,                      you may rinse your mouth with mouthwash if you wish.                            Do not swallow any toothpaste of mouthwash.     _X__ 3.  No Alcohol for 24 hours before or after surgery.   _X__ 4.  Do Not Smoke or use e-cigarettes For 24 Hours Prior to Your Surgery.                 Do not use any chewable tobacco products for at least 6 hours prior to                 surgery.  ____  5.  Bring all medications with you on the day of surgery if instructed.   _X_  6.  Notify your doctor if there is any change in your medical condition      (cold, fever, infections).     Do not wear jewelry, make-up, hairpins, clips or nail polish. Do not wear lotions, powders, or perfumes. You may wear deodorant. Do not shave 48 hours prior to surgery. Men may shave face and neck. Do not bring  valuables to the hospital.    [facility] is not responsible for any belongings or valuables.  Contacts, dentures or bridgework may not be worn into surgery. Leave your suitcase in the car. After surgery it may be brought to your room. For patients admitted to the hospital, discharge time is determined by your treatment team.   Patients discharged the  day of surgery will not be allowed to drive home.   Please read over the following fact sheets that you were given:   PREPARING FOR SURGERY   ____ Take these medicines the morning of surgery with A SIP OF WATER:    1. BUPROPRION  2. CARVEDILOL  3. XANAX IF NEEDED  4.  5.  6.  ____ Fleet Enema (as directed)   _X___ Use CHG Soap as directed  ____ Use inhalers on the day of surgery  __X__ Stop ASPIRIN PRODUCTS TODAY            THIS INCLUDES EXCEDRINE / BC POWDER / GOODYS POWDER  _X___ Stop Anti-inflammatories NOW!!            THIS INCLUDES IBUPROFEN / ADVIL / MOTRIN / ALEVE             YOU MAY TYLENOL IF NEEDED BEFORE SURGERY   ____ Stop supplements until after surgery.    ____ Bring C-Pap to the hospital.   IF POA PAPERS ARE NOTIFIED PRIOR TO SURGERY,   PLEASE BRING WITH YOU SO WE CAN MAKE A COPY     FOR YOUR CHART

## 2017-04-14 MED ORDER — CEFAZOLIN SODIUM-DEXTROSE 2-4 GM/100ML-% IV SOLN
2.0000 g | INTRAVENOUS | Status: AC
  Administered 2017-04-15: 2 g via INTRAVENOUS

## 2017-04-14 MED ORDER — CLINDAMYCIN PHOSPHATE 600 MG/50ML IV SOLN
600.0000 mg | INTRAVENOUS | Status: AC
  Administered 2017-04-15: 600 mg via INTRAVENOUS

## 2017-04-15 ENCOUNTER — Encounter
Admission: RE | Disposition: A | Discharge status: Home / Self Care | Payer: Self-pay | Source: Ambulatory Visit | Attending: Specialist

## 2017-04-15 ENCOUNTER — Ambulatory Visit: Payer: Medicare PPO | Admitting: Anesthesiology

## 2017-04-15 ENCOUNTER — Ambulatory Visit
Admission: RE | Admit: 2017-04-15 | Discharge: 2017-04-15 | Disposition: A | Discharge status: Home / Self Care | Payer: Medicare PPO | Source: Ambulatory Visit | Attending: Specialist | Admitting: Specialist

## 2017-04-15 DIAGNOSIS — G5601 Carpal tunnel syndrome, right upper limb: Secondary | ICD-10-CM | POA: Diagnosis not present

## 2017-04-15 DIAGNOSIS — I252 Old myocardial infarction: Secondary | ICD-10-CM | POA: Diagnosis not present

## 2017-04-15 DIAGNOSIS — R569 Unspecified convulsions: Secondary | ICD-10-CM | POA: Diagnosis not present

## 2017-04-15 DIAGNOSIS — I2510 Atherosclerotic heart disease of native coronary artery without angina pectoris: Secondary | ICD-10-CM | POA: Diagnosis not present

## 2017-04-15 DIAGNOSIS — F418 Other specified anxiety disorders: Secondary | ICD-10-CM | POA: Diagnosis not present

## 2017-04-15 DIAGNOSIS — I10 Essential (primary) hypertension: Secondary | ICD-10-CM | POA: Insufficient documentation

## 2017-04-15 DIAGNOSIS — E785 Hyperlipidemia, unspecified: Secondary | ICD-10-CM | POA: Diagnosis not present

## 2017-04-15 DIAGNOSIS — Z955 Presence of coronary angioplasty implant and graft: Secondary | ICD-10-CM | POA: Diagnosis not present

## 2017-04-15 HISTORY — PX: CARPAL TUNNEL RELEASE: SHX101

## 2017-04-15 LAB — GLUCOSE, CAPILLARY: Glucose-Capillary: 104 mg/dL — ABNORMAL HIGH (ref 65–99)

## 2017-04-15 SURGERY — CARPAL TUNNEL RELEASE
Anesthesia: General | Site: Wrist | Laterality: Right | Wound class: Clean

## 2017-04-15 MED ORDER — HYDROCODONE-ACETAMINOPHEN 5-325 MG PO TABS: 1.0000 | ORAL_TABLET | Freq: Four times a day (QID) | ORAL | 0 refills | Status: DC | PRN

## 2017-04-15 MED ORDER — PROPOFOL 10 MG/ML IV BOLUS
INTRAVENOUS | Status: DC | PRN
  Administered 2017-04-15: 150 mg via INTRAVENOUS

## 2017-04-15 MED ORDER — FAMOTIDINE 20 MG PO TABS
20.0000 mg | ORAL_TABLET | Freq: Once | ORAL | Status: AC
  Administered 2017-04-15: 20 mg via ORAL

## 2017-04-15 MED ORDER — FENTANYL CITRATE (PF) 100 MCG/2ML IJ SOLN
INTRAMUSCULAR | Status: AC
  Filled 2017-04-15: qty 2

## 2017-04-15 MED ORDER — MELOXICAM 7.5 MG PO TABS
ORAL_TABLET | ORAL | Status: AC
  Administered 2017-04-15: 15 mg via ORAL
  Filled 2017-04-15: qty 2

## 2017-04-15 MED ORDER — CEFAZOLIN SODIUM-DEXTROSE 2-4 GM/100ML-% IV SOLN
INTRAVENOUS | Status: AC
  Filled 2017-04-15: qty 100

## 2017-04-15 MED ORDER — ONDANSETRON HCL 4 MG/2ML IJ SOLN: 4.0000 mg | Freq: Once | INTRAMUSCULAR | Status: DC | PRN

## 2017-04-15 MED ORDER — LIDOCAINE HCL (PF) 2 % IJ SOLN
INTRAMUSCULAR | Status: DC | PRN
  Administered 2017-04-15: 50 mg

## 2017-04-15 MED ORDER — KETAMINE HCL 50 MG/ML IJ SOLN
INTRAMUSCULAR | Status: DC | PRN
  Administered 2017-04-15: 25 mg via INTRAMUSCULAR

## 2017-04-15 MED ORDER — MELOXICAM 15 MG PO TABS: 15.0000 mg | ORAL_TABLET | Freq: Every day | ORAL | 2 refills | Status: DC

## 2017-04-15 MED ORDER — FENTANYL CITRATE (PF) 100 MCG/2ML IJ SOLN
INTRAMUSCULAR | Status: DC | PRN
  Administered 2017-04-15 (×2): 25 ug via INTRAVENOUS
  Administered 2017-04-15: 50 ug via INTRAVENOUS

## 2017-04-15 MED ORDER — MIDAZOLAM HCL 2 MG/2ML IJ SOLN
INTRAMUSCULAR | Status: AC
  Filled 2017-04-15: qty 2

## 2017-04-15 MED ORDER — MELOXICAM 7.5 MG PO TABS
15.0000 mg | ORAL_TABLET | ORAL | Status: AC
  Administered 2017-04-15: 15 mg via ORAL

## 2017-04-15 MED ORDER — GLYCOPYRROLATE 0.2 MG/ML IJ SOLN
INTRAMUSCULAR | Status: DC | PRN
  Administered 2017-04-15: 0.2 mg via INTRAVENOUS

## 2017-04-15 MED ORDER — MIDAZOLAM HCL 5 MG/5ML IJ SOLN
INTRAMUSCULAR | Status: DC | PRN
  Administered 2017-04-15: 2 mg via INTRAVENOUS

## 2017-04-15 MED ORDER — CHLORHEXIDINE GLUCONATE CLOTH 2 % EX PADS: 6.0000 | MEDICATED_PAD | Freq: Once | CUTANEOUS | Status: DC

## 2017-04-15 MED ORDER — ONDANSETRON HCL 4 MG/2ML IJ SOLN
INTRAMUSCULAR | Status: AC
  Filled 2017-04-15: qty 2

## 2017-04-15 MED ORDER — GABAPENTIN 400 MG PO CAPS: 400.0000 mg | ORAL_CAPSULE | Freq: Two times a day (BID) | ORAL | 3 refills | Status: DC

## 2017-04-15 MED ORDER — BUPIVACAINE HCL (PF) 0.5 % IJ SOLN
INTRAMUSCULAR | Status: DC | PRN
  Administered 2017-04-15: 17 mL

## 2017-04-15 MED ORDER — BUPIVACAINE HCL (PF) 0.5 % IJ SOLN
INTRAMUSCULAR | Status: AC
  Filled 2017-04-15: qty 30

## 2017-04-15 MED ORDER — GABAPENTIN 400 MG PO CAPS
ORAL_CAPSULE | ORAL | Status: AC
  Administered 2017-04-15: 400 mg via ORAL
  Filled 2017-04-15: qty 1

## 2017-04-15 MED ORDER — ONDANSETRON HCL 4 MG/2ML IJ SOLN
INTRAMUSCULAR | Status: DC | PRN
  Administered 2017-04-15: 4 mg via INTRAVENOUS

## 2017-04-15 MED ORDER — GABAPENTIN 400 MG PO CAPS
400.0000 mg | ORAL_CAPSULE | ORAL | Status: AC
  Administered 2017-04-15: 400 mg via ORAL

## 2017-04-15 MED ORDER — TRIAMCINOLONE ACETONIDE 40 MG/ML IJ SUSP
INTRAMUSCULAR | Status: AC
  Filled 2017-04-15: qty 1

## 2017-04-15 MED ORDER — GLYCOPYRROLATE 0.2 MG/ML IJ SOLN
INTRAMUSCULAR | Status: AC
  Filled 2017-04-15: qty 1

## 2017-04-15 MED ORDER — SODIUM CHLORIDE 0.9 % IV SOLN
INTRAVENOUS | Status: DC
  Administered 2017-04-15: 14:00:00 via INTRAVENOUS

## 2017-04-15 MED ORDER — TRIAMCINOLONE ACETONIDE 40 MG/ML IJ SUSP
INTRAMUSCULAR | Status: DC | PRN
  Administered 2017-04-15: 2 mL

## 2017-04-15 MED ORDER — FENTANYL CITRATE (PF) 100 MCG/2ML IJ SOLN: 25.0000 ug | INTRAMUSCULAR | Status: DC | PRN

## 2017-04-15 MED ORDER — CLINDAMYCIN PHOSPHATE 600 MG/50ML IV SOLN
INTRAVENOUS | Status: AC
  Filled 2017-04-15: qty 50

## 2017-04-15 MED ORDER — FAMOTIDINE 20 MG PO TABS
ORAL_TABLET | ORAL | Status: AC
  Administered 2017-04-15: 20 mg via ORAL
  Filled 2017-04-15: qty 1

## 2017-04-15 MED ORDER — DEXAMETHASONE SODIUM PHOSPHATE 10 MG/ML IJ SOLN
INTRAMUSCULAR | Status: DC | PRN
  Administered 2017-04-15: 5 mg via INTRAVENOUS

## 2017-04-15 MED ORDER — DEXAMETHASONE SODIUM PHOSPHATE 10 MG/ML IJ SOLN
INTRAMUSCULAR | Status: AC
  Filled 2017-04-15: qty 1

## 2017-04-15 SURGICAL SUPPLY — 27 items
BLADE SURG MINI STRL (BLADE) ×3 IMPLANT
BNDG ESMARK 4X12 TAN STRL LF (GAUZE/BANDAGES/DRESSINGS) ×3 IMPLANT
CANISTER SUCT 1200ML W/VALVE (MISCELLANEOUS) ×3 IMPLANT
CHLORAPREP W/TINT 26ML (MISCELLANEOUS) ×3 IMPLANT
CUFF TOURN 18 STER (MISCELLANEOUS) IMPLANT
ELECT REM PT RETURN 9FT ADLT (ELECTROSURGICAL) ×3
ELECTRODE REM PT RTRN 9FT ADLT (ELECTROSURGICAL) ×1 IMPLANT
GAUZE FLUFF 18X24 1PLY STRL (GAUZE/BANDAGES/DRESSINGS) ×3 IMPLANT
GAUZE PETRO XEROFOAM 1X8 (MISCELLANEOUS) ×3 IMPLANT
GLOVE BIO SURGEON STRL SZ8 (GLOVE) ×3 IMPLANT
GOWN STRL REUS W/ TWL LRG LVL3 (GOWN DISPOSABLE) ×1 IMPLANT
GOWN STRL REUS W/TWL LRG LVL3 (GOWN DISPOSABLE) ×2
GOWN STRL REUS W/TWL LRG LVL4 (GOWN DISPOSABLE) ×3 IMPLANT
KIT TURNOVER KIT A (KITS) ×3 IMPLANT
NS IRRIG 500ML POUR BTL (IV SOLUTION) ×3 IMPLANT
PACK EXTREMITY ARMC (MISCELLANEOUS) ×3 IMPLANT
PAD PREP 24X41 OB/GYN DISP (PERSONAL CARE ITEMS) ×3 IMPLANT
PADDING CAST 4IN STRL (MISCELLANEOUS) ×2
PADDING CAST BLEND 4X4 STRL (MISCELLANEOUS) ×1 IMPLANT
SPLINT CAST 1 STEP 3X12 (MISCELLANEOUS) ×3 IMPLANT
STOCKINETTE BIAS CUT 4 980044 (GAUZE/BANDAGES/DRESSINGS) ×3 IMPLANT
STOCKINETTE STRL 4IN 9604848 (GAUZE/BANDAGES/DRESSINGS) ×3 IMPLANT
SUT ETHILON 4-0 (SUTURE) ×2
SUT ETHILON 4-0 FS2 18XMFL BLK (SUTURE) ×1
SUT ETHILON 5-0 FS-2 18 BLK (SUTURE) ×3 IMPLANT
SUTURE ETHLN 4-0 FS2 18XMF BLK (SUTURE) ×1 IMPLANT
SYR 5ML LL (SYRINGE) ×3 IMPLANT

## 2017-04-15 NOTE — Op Note (Signed)
04/15/2017  3:46 PM  PATIENT:  Elizabeth Johnson    PRE-OPERATIVE DIAGNOSIS: RIGHT CARPAL TUNNEL SYNDROME POST-OPERATIVE DIAGNOSIS: RIGHT CARPAL TUNNEL SYNDROME  PROCEDURE:  RIGHT CARPAL TUNNEL RELEASE  SURGEON: Park Breed, MD    ANESTHESIA:   General  TOURNIQUET TIME: 54   MIN  PREOPERATIVE INDICATIONS:  [name] is a  67 y.o. female with a diagnosis of right carpal tunnel syndrome who failed conservative measures and elected for surgical management.    The risks benefits and alternatives were discussed with the patient preoperatively including but not limited to the risks of infection, bleeding, nerve injury, incomplete relief of symptoms, pillar pain, cardiopulmonary complications, the need for revision surgery, among others, and the patient was willing to proceed.  OPERATIVE FINDINGS: Thickened volar ligament and nerve compression.  OPERATIVE PROCEDURE: The patient is brought to the operating room placed in the supine position. General anesthesia was administered. The right upper extremity was prepped and draped in usual sterile fashion. Time out was performed. The arm was elevated and exsanguinated and the tourniquet was inflated. Incision was made in line with the radial border of the ring finger. The carpal tunnel transverse fascia was identified, cleaned, and incised sharply. The common sensory branches were visualized along with the superficial palmar arch and protected.  The median nerve was protected below  A Kelly clamp was  placed underneath the transverse carpal ligament, protecting the nerve. I released the ligament completely, and then released the proximal distal volar forearm fascia. The nerve was identified, and visualized, and protected throughout the case. The motor branch was intact upon inspection.  No masses or abnormalities were identified in ulnar bursa.  The wounds were irrigated copiously, and the skin closed with nylon. 30 mg Kenalog were injected into  the radioscaphoid joint. The wound was  injected with 1/2% sensorcaine,followed by  sterile gauze and a volar splint.  Tourniquet was deflated with good return of blood flow to all fingers. Sponge and needle counts were correct.  The patient tolerated this well, with no complications. The patient was awakened and taken to recovery in good condition.

## 2017-04-15 NOTE — H&P (Signed)
THE PATIENT WAS SEEN PRIOR TO SURGERY TODAY.  HISTORY, ALLERGIES, HOME MEDICATIONS AND OPERATIVE PROCEDURE WERE REVIEWED. RISKS AND BENEFITS OF SURGERY DISCUSSED WITH PATIENT AGAIN.  NO CHANGES FROM INITIAL HISTORY AND PHYSICAL NOTED.    

## 2017-04-15 NOTE — Anesthesia Procedure Notes (Signed)
Procedure Name: LMA Insertion Performed by: Loyce Klasen, CRNA Pre-anesthesia Checklist: Patient identified, Patient being monitored, Timeout performed, Emergency Drugs available and Suction available Patient Re-evaluated:Patient Re-evaluated prior to induction Oxygen Delivery Method: Circle system utilized Preoxygenation: Pre-oxygenation with 100% oxygen Induction Type: IV induction Ventilation: Mask ventilation without difficulty LMA: LMA inserted LMA Size: 3.5 Tube type: Oral Number of attempts: 1 Placement Confirmation: positive ETCO2 and breath sounds checked- equal and bilateral Tube secured with: Tape Dental Injury: Teeth and Oropharynx as per pre-operative assessment        

## 2017-04-15 NOTE — Anesthesia Post-op Follow-up Note (Signed)
Anesthesia QCDR form completed.        

## 2017-04-15 NOTE — Transfer of Care (Signed)
Immediate Anesthesia Transfer of Care Note  Patient: Elizabeth Johnson  Procedure(s) Performed: CARPAL TUNNEL RELEASE (Right Wrist)  Patient Location: PACU  Anesthesia Type:General  Level of Consciousness: sedated  Airway & Oxygen Therapy: Patient Spontanous Breathing and Patient connected to nasal cannula oxygen  Post-op Assessment: Report given to RN and Post -op Vital signs reviewed and stable  Post vital signs: Reviewed  Last Vitals:  Vitals Value Taken Time  BP 119/82 04/15/2017  3:45 PM  Temp    Pulse 83 04/15/2017  3:45 PM  Resp 9 04/15/2017  3:45 PM  SpO2 96 % 04/15/2017  3:45 PM  Vitals shown include unvalidated device data.  Last Pain:  Vitals:   04/15/17 1329  TempSrc: Tympanic  PainSc: 0-No pain         Complications: No apparent anesthesia complications

## 2017-04-15 NOTE — Discharge Instructions (Signed)

## 2017-04-15 NOTE — Anesthesia Preprocedure Evaluation (Addendum)
Anesthesia Evaluation  Patient identified by MRN, date of birth, ID band Patient awake    Reviewed: Allergy & Precautions, NPO status , Patient's Chart, lab work & pertinent test results  History of Anesthesia Complications Negative for: history of anesthetic complications  Airway Mallampati: III       Dental   Pulmonary neg sleep apnea, neg COPD, former smoker,           Cardiovascular hypertension, Pt. on medications and Pt. on home beta blockers + CAD, + Past MI and + Cardiac Stents  (-) CHF (-) dysrhythmias (-) Valvular Problems/Murmurs     Neuro/Psych neg Seizures Anxiety Depression    GI/Hepatic Neg liver ROS, neg GERD  ,  Endo/Other  neg diabetes (borderline)  Renal/GU negative Renal ROS     Musculoskeletal   Abdominal   Peds  Hematology   Anesthesia Other Findings   Reproductive/Obstetrics                            Anesthesia Physical Anesthesia Plan  ASA: III  Anesthesia Plan: General   Post-op Pain Management:    Induction: Intravenous  PONV Risk Score and Plan: 3 and Ondansetron, Dexamethasone and Midazolam  Airway Management Planned: LMA  Additional Equipment:   Intra-op Plan:   Post-operative Plan:   Informed Consent: I have reviewed the patients History and Physical, chart, labs and discussed the procedure including the risks, benefits and alternatives for the proposed anesthesia with the patient or authorized representative who has indicated his/her understanding and acceptance.     Plan Discussed with:   Anesthesia Plan Comments:         Anesthesia Quick Evaluation

## 2017-04-16 ENCOUNTER — Encounter: Payer: Self-pay | Admitting: Specialist

## 2017-04-16 NOTE — Anesthesia Postprocedure Evaluation (Signed)
Anesthesia Post Note  Patient: Elizabeth Johnson  Procedure(s) Performed: CARPAL TUNNEL RELEASE (Right Wrist)  Patient location during evaluation: PACU Anesthesia Type: General Level of consciousness: awake and alert Pain management: pain level controlled Vital Signs Assessment: post-procedure vital signs reviewed and stable Respiratory status: spontaneous breathing and respiratory function stable Cardiovascular status: stable Anesthetic complications: no     Last Vitals:  Vitals:   04/15/17 1628 04/15/17 1721  BP: (!) 144/97 (!) 152/98  Pulse: 93 93  Resp: 16 16  Temp: 36.7 C   SpO2: 95% 97%    Last Pain:  Vitals:   04/15/17 1628  TempSrc: Oral  PainSc: 0-No pain                 Eldar Robitaille K

## 2017-04-17 ENCOUNTER — Other Ambulatory Visit: Payer: Self-pay | Admitting: Family Medicine

## 2017-04-17 DIAGNOSIS — F320 Major depressive disorder, single episode, mild: Secondary | ICD-10-CM

## 2017-04-17 DIAGNOSIS — G5601 Carpal tunnel syndrome, right upper limb: Secondary | ICD-10-CM | POA: Diagnosis not present

## 2017-04-17 NOTE — Telephone Encounter (Signed)
Refill request for general medication: Bupropion 300 mg 24 hr tablet  Last office visit: 04/03/2017   Last physical exam: 10/16/2016  Follow-ups on file. 10/16/2016

## 2017-04-24 ENCOUNTER — Other Ambulatory Visit: Payer: Self-pay

## 2017-04-24 DIAGNOSIS — G4709 Other insomnia: Secondary | ICD-10-CM

## 2017-04-24 NOTE — Telephone Encounter (Signed)
Patient is requestin g full 30 day supply through [employer].

## 2017-05-30 ENCOUNTER — Other Ambulatory Visit: Payer: Self-pay | Admitting: Family Medicine

## 2017-05-30 DIAGNOSIS — G4709 Other insomnia: Secondary | ICD-10-CM

## 2017-05-30 DIAGNOSIS — I10 Essential (primary) hypertension: Secondary | ICD-10-CM

## 2017-05-30 DIAGNOSIS — I2510 Atherosclerotic heart disease of native coronary artery without angina pectoris: Secondary | ICD-10-CM

## 2017-05-30 NOTE — Telephone Encounter (Signed)
Copied from [place] [id number]. Topic: Quick Communication - Rx Refill/Question >> May 30, 2017 [place] AM Antonieta Iba C wrote: Medication: ALPRAZolam Duanne Moron) 0.5 MG tablet,   Has the patient contacted their pharmacy? no  (Agent: If no, request that the patient contact the pharmacy for the refill.)  Preferred Pharmacy (with phone number or street name): [place], [place] - [place] [phone] (Phone) [phone] (Fax)     Agent: Please be advised that RX refills may take up to 3 business days. We ask that you follow-up with your pharmacy.

## 2017-05-30 NOTE — Telephone Encounter (Signed)
Copied from [place]. Topic: Quick Communication - Rx Refill/Question >> May 30, 2017 11:17 AM Antonieta Iba C wrote: Medication: pravastatin (PRAVACHOL) 40 MG tablet, lisinopril (PRINIVIL,ZESTRIL) 20 MG tablet   Has the patient contacted their pharmacy? Yes   (Agent: If no, request that the patient contact the pharmacy for the refill.)  Preferred Pharmacy (with phone number or street name): [place], [place] [phone] (Phone) [phone] (Fax)     Agent: Please be advised that RX refills may take up to 3 business days. We ask that you follow-up with your pharmacy.

## 2017-05-31 MED ORDER — ALPRAZOLAM 0.5 MG PO TABS: 0.2500 mg | ORAL_TABLET | Freq: Every day | ORAL | 0 refills | Status: DC | PRN

## 2017-05-31 MED ORDER — PRAVASTATIN SODIUM 40 MG PO TABS: 40.0000 mg | ORAL_TABLET | Freq: Every day | ORAL | 1 refills | Status: DC

## 2017-05-31 MED ORDER — LISINOPRIL 20 MG PO TABS: 20.0000 mg | ORAL_TABLET | Freq: Every day | ORAL | 1 refills | Status: DC

## 2017-05-31 NOTE — Telephone Encounter (Signed)
Refill request for general medication. Alprazolam  Last office visit: 01/07/2017   No follow up schedule

## 2017-05-31 NOTE — Telephone Encounter (Signed)
Rx refill request: Lisinopril 20 mg  Last filled: 10/16/16 # 90                            Pravastatin 40 mg   Last filled: 10/16/16 # 90   LOV: 01/07/17  ( 04/03/17-pre-op)  PCP: [place]

## 2017-08-02 ENCOUNTER — Ambulatory Visit: Payer: Medicare PPO | Admitting: Family Medicine

## 2017-09-10 ENCOUNTER — Ambulatory Visit (INDEPENDENT_AMBULATORY_CARE_PROVIDER_SITE_OTHER): Payer: Medicare PPO | Admitting: Family Medicine

## 2017-09-10 ENCOUNTER — Encounter: Payer: Self-pay | Admitting: Family Medicine

## 2017-09-10 VITALS — BP 114/64 | HR 108 | Temp 98.4°F | Resp 18 | Ht 61.0 in | Wt 153.3 lb

## 2017-09-10 DIAGNOSIS — I252 Old myocardial infarction: Secondary | ICD-10-CM

## 2017-09-10 DIAGNOSIS — E8881 Metabolic syndrome: Secondary | ICD-10-CM | POA: Diagnosis not present

## 2017-09-10 DIAGNOSIS — E785 Hyperlipidemia, unspecified: Secondary | ICD-10-CM

## 2017-09-10 DIAGNOSIS — G4709 Other insomnia: Secondary | ICD-10-CM | POA: Diagnosis not present

## 2017-09-10 DIAGNOSIS — R202 Paresthesia of skin: Secondary | ICD-10-CM

## 2017-09-10 DIAGNOSIS — I209 Angina pectoris, unspecified: Secondary | ICD-10-CM

## 2017-09-10 DIAGNOSIS — R739 Hyperglycemia, unspecified: Secondary | ICD-10-CM | POA: Diagnosis not present

## 2017-09-10 DIAGNOSIS — I10 Essential (primary) hypertension: Secondary | ICD-10-CM

## 2017-09-10 DIAGNOSIS — F320 Major depressive disorder, single episode, mild: Secondary | ICD-10-CM | POA: Diagnosis not present

## 2017-09-10 DIAGNOSIS — I2510 Atherosclerotic heart disease of native coronary artery without angina pectoris: Secondary | ICD-10-CM | POA: Diagnosis not present

## 2017-09-10 MED ORDER — ALPRAZOLAM 0.5 MG PO TABS: 0.2500 mg | ORAL_TABLET | Freq: Every day | ORAL | 0 refills | Status: DC | PRN

## 2017-09-10 MED ORDER — CARVEDILOL 3.125 MG PO TABS: 3.1250 mg | ORAL_TABLET | Freq: Two times a day (BID) | ORAL | 0 refills | Status: DC

## 2017-09-10 MED ORDER — LISINOPRIL 10 MG PO TABS: 10.0000 mg | ORAL_TABLET | Freq: Every day | ORAL | 0 refills | Status: DC

## 2017-09-10 MED ORDER — BUPROPION HCL ER (XL) 300 MG PO TB24: 300.0000 mg | ORAL_TABLET | Freq: Every day | ORAL | 1 refills | Status: DC

## 2017-09-10 MED ORDER — PRAVASTATIN SODIUM 40 MG PO TABS: 40.0000 mg | ORAL_TABLET | Freq: Every day | ORAL | 1 refills | Status: DC

## 2017-09-10 NOTE — Progress Notes (Signed)
Name: Elizabeth Johnson   MRN: [id number]    DOB: 03/21/1950   Date:09/10/2017       Progress Note  Subjective  Chief Complaint  Chief Complaint  Patient presents with  . Medication Refill  . Hypertension    Denies any symptoms  . Hyperlipidemia  . Depression  . Insomnia    Needs refill of her alprazolam to local pharmacy  . Back Pain    HPI  History of right carpal tunnel release : done by Dr. Miller March 2019, sheis doing well in term of tingling and weakness except for mild tingling on right middle finger, having trigger thumb now. Still has aches and pains on wrist wrist from arthritis.   Hyperlipidemia: she is on Pravastatin now, not taking fish oil and we will recheck labs. She states stopped crestor because it caused muscle aches. Discussed importance of statin therapy because of her history  HTN: patient is doing well, no chest pain , palpitation or SOB.She denies any recent dizziness but bp is towards low end of normal   Major Depression: still struggling with the fact that she cannot work, mainly because of arthritis but also taking care of aging father and dogs. However phq9 has improved.   Back pain: she has mild low back pain,  Flexeril qhs prn only. Described as spasms, no radiculitis  Insomnia: she takes alprazolam very seldom and needs a refill,she was given 5 pills May 2019. More stressed lately, worried about aging dogs and also father that is under hospice at this time.   Angina pectoris: history of MI, denies recent chest pain, has NTG at home, on beta-blocker, statin therapy and ACE, also takes aspirin . Not seeing cardiologist at this time  Hyperglycemia: denies polyphagia, polydipsia or polyuria  Tingling on legs: usually at night we will check B12 level   Patient Active Problem List   Diagnosis Date Noted  . Biceps tendon rupture 09/21/2015  . H/O hysterectomy for benign disease 09/02/2014  . Acquired bilateral hammer toes 09/02/2014  .  External hemorrhoid 09/02/2014  . Allergic rhinitis 07/19/2014  . Carpal tunnel syndrome on right 07/19/2014  . Insomnia, persistent 07/19/2014  . Chronic LBP 07/19/2014  . Mild major depression (HCC) 07/19/2014  . Dyslipidemia 07/19/2014  . Hemorrhoid 07/19/2014  . Dysmetabolic syndrome 07/19/2014  . 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 Osteopenia 07/19/2014  . Benign essential HTN 02/04/2009  . H/O acute myocardial infarction of lateral wall 07/18/2004    Past Surgical History:  Procedure Laterality Date  . ABDOMINAL HYSTERECTOMY    . BUNIONECTOMY Bilateral 1973  . CARPAL TUNNEL RELEASE Right 04/15/2017   Procedure: CARPAL TUNNEL RELEASE;  Surgeon: Miller, Howard, MD;  Location: [facility] ORS;  Service: Orthopedics;  Laterality: Right;  . COLONOSCOPY WITH PROPOFOL N/A 11/23/2016   Procedure: COLONOSCOPY WITH PROPOFOL;  Surgeon: Anna, Kiran, MD;  Location: [facility] ENDOSCOPY;  Service: Gastroenterology;  Laterality: N/A;  . CORONARY STENT PLACEMENT  2009  . ELBOW SURGERY Left 1977  . HAMMER TOE SURGERY Right 1992  . RIB FRACTURE SURGERY    . WRIST FRACTURE SURGERY Right 1977    Family History  Problem Relation Age of Onset  . Alzheimer's disease Mother   . Breast cancer Mother 62  . CAD Father   . Heart attack Father   . Drug abuse Sister   . Suicidality Sister   . Heart attack Brother     Social History   Socioeconomic History  .   Marital status: Married    Spouse name: Not on file  . Number of children: 1  . Years of education: Not on file  . Highest education level: Not on file  Occupational History  . Occupation: retired   Scientific laboratory technician  . Financial resource strain: Not on file  . Food insecurity:    Worry: Not on file    Inability: Not on file  . Transportation needs:    Medical: Not on file    Non-medical: Not on file  Tobacco Use  . Smoking status: Former Smoker    Years: 8.00    Types: Cigarettes    Start date: 01/23/1999     Last attempt to quit: 05/23/2007    Years since quitting: 10.3  . Smokeless tobacco: Never Used  Substance and Sexual Activity  . Alcohol use: Yes    Alcohol/week: 0.0 standard drinks    Comment: occasional  . Drug use: No  . Sexual activity: Yes    Partners: Male  Lifestyle  . Physical activity:    Days per week: Not on file    Minutes per session: Not on file  . Stress: Not on file  Relationships  . Social connections:    Talks on phone: Not on file    Gets together: Not on file    Attends religious service: Not on file    Active member of club or organization: Not on file    Attends meetings of clubs or organizations: Not on file    Relationship status: Not on file  . Intimate partner violence:    Fear of current or ex partner: Not on file    Emotionally abused: Not on file    Physically abused: Not on file    Forced sexual activity: Not on file  Other Topics Concern  . Not on file  Social History Narrative   She worked for the same company for 20 years and they closed [place] branch and she was forced to retire Fall 2017.    Married      Current Outpatient Medications:  .  ALPRAZolam (XANAX) 0.5 MG tablet, Take 0.5 tablets (0.25 mg total) by mouth daily as needed for anxiety., Disp: 30 tablet, Rfl: 0 .  aspirin 81 MG chewable tablet, Chew 81 mg by mouth daily. , Disp: , Rfl:  .  Blood Glucose Calibration (ACCU-CHEK SMARTVIEW CONTROL) LIQD, 1 each by In Vitro route daily., Disp: 200 each, Rfl: 1 .  Blood Glucose Monitoring Suppl (ACCU-CHEK NANO SMARTVIEW) w/Device KIT, 1 kit by Does not apply route daily., Disp: 1 kit, Rfl: 0 .  buPROPion (WELLBUTRIN XL) 300 MG 24 hr tablet, Take 1 tablet (300 mg total) by mouth daily., Disp: 90 tablet, Rfl: 1 .  carvedilol (COREG) 3.125 MG tablet, Take 1 tablet (3.125 mg total) by mouth 2 (two) times daily with a meal., Disp: 180 tablet, Rfl: 0 .  cyclobenzaprine (FLEXERIL) 10 MG tablet, Take 5 mg by mouth 3 (three) times daily as needed  for muscle spasms., Disp: , Rfl:  .  Lancets Misc. (ACCU-CHEK SOFTCLIX LANCET DEV) KIT, , Disp: , Rfl:  .  lisinopril (PRINIVIL,ZESTRIL) 10 MG tablet, Take 1 tablet (10 mg total) by mouth daily., Disp: 90 tablet, Rfl: 0 .  pravastatin (PRAVACHOL) 40 MG tablet, Take 1 tablet (40 mg total) by mouth daily., Disp: 90 tablet, Rfl: 1 .  ibuprofen (ADVIL,MOTRIN) 200 MG tablet, Take 400 mg by mouth every 6 (six) hours as needed for headache or  moderate pain., Disp: , Rfl:  .  nitroGLYCERIN (NITROSTAT) 0.4 MG SL tablet, Place 1 tablet (0.4 mg total) under the tongue every 5 (five) minutes as needed for chest pain. (Patient not taking: Reported on 04/15/2017), Disp: 10 tablet, Rfl: 2 .  Omega-3 Fatty Acids (FISH OIL PO), Take 1 capsule by mouth daily., Disp: , Rfl:   Allergies  Allergen Reactions  . Codeine Other (See Comments)    Sensitivity. Nauseated   . Crestor [Rosuvastatin Calcium] Other (See Comments)    Muscle cramp     ROS  Constitutional: Negative for fever or weight change.  Respiratory: Negative for cough and shortness of breath.   Cardiovascular: Negative for chest pain or palpitations.  Gastrointestinal: Negative for abdominal pain, no bowel changes.  Musculoskeletal: Negative for gait problem or joint swelling.  Skin: Negative for rash.  Neurological: Negative for dizziness or headache.  No other specific complaints in a complete review of systems (except as listed in HPI above).  Objective  Vitals:   09/10/17 1141  BP: 114/64  Pulse: (!) 108  Resp: 18  Temp: 98.4 F (36.9 C)  TempSrc: Oral  SpO2: 93%  Weight: 153 lb 4.8 oz (69.5 kg)  Height: 5' 1" (1.549 m)    Body mass index is 28.97 kg/m.  Physical Exam  Constitutional: Patient appears well-developed and well-nourished. Obese No distress.  HEENT: head atraumatic, normocephalic, pupils equal and reactive to light,neck supple, throat within normal limits Cardiovascular: Normal rate, regular rhythm and normal  heart sounds.  No murmur heard. No BLE edema. Pulmonary/Chest: Effort normal and breath sounds normal. No respiratory distress. Abdominal: Soft.  There is no tenderness. Psychiatric: Patient has a normal mood and affect. behavior is normal. Judgment and thought content normal.   PHQ2/9: Depression screen PHQ 2/9 09/10/2017 01/07/2017 10/16/2016 09/21/2015 01/06/2015  Decreased Interest 0 2 1 0 0  Down, Depressed, Hopeless 0 2 1 0 0  PHQ - 2 Score 0 4 2 0 0  Altered sleeping 2 2 3 - -  Tired, decreased energy 2 3 2 - -  Change in appetite 0 0 0 - -  Feeling bad or failure about yourself  1 2 3 - -  Trouble concentrating 0 0 0 - -  Moving slowly or fidgety/restless 0 0 0 - -  Suicidal thoughts 0 0 0 - -  PHQ-9 Score 5 11 10 - -  Difficult doing work/chores Not difficult at all Somewhat difficult Somewhat difficult - -     Fall Risk: Fall Risk  09/10/2017 01/07/2017 10/16/2016 09/21/2015 01/06/2015  Falls in the past year? No No Yes No No  Number falls in past yr: - - 1 - -  Injury with Fall? - - No - -     Functional Status Survey: Is the patient deaf or have difficulty hearing?: No Does the patient have difficulty seeing, even when wearing glasses/contacts?: Yes(glasses) Does the patient have difficulty concentrating, remembering, or making decisions?: No Does the patient have difficulty walking or climbing stairs?: No Does the patient have difficulty dressing or bathing?: No Does the patient have difficulty doing errands alone such as visiting a doctor's office or shopping?: No   Assessment & Plan  1. Mild major depression (HCC)  - buPROPion (WELLBUTRIN XL) 300 MG 24 hr tablet; Take 1 tablet (300 mg total) by mouth daily.  Dispense: 90 tablet; Refill: 1  2. Other insomnia  - ALPRAZolam (XANAX) 0.5 MG tablet; Take 0.5 tablets (0.25 mg total) by   mouth daily as needed for anxiety.  Dispense: 30 tablet; Refill: 0  3. Benign essential HTN  Decreasing dose of medications since  bp is towards low end of normal and she is tachycardic  - carvedilol (COREG) 3.125 MG tablet; Take 1 tablet (3.125 mg total) by mouth 2 (two) times daily with a meal.  Dispense: 180 tablet; Refill: 0 - lisinopril (PRINIVIL,ZESTRIL) 10 MG tablet; Take 1 tablet (10 mg total) by mouth daily.  Dispense: 90 tablet; Refill: 0 - COMPLETE METABOLIC PANEL WITH GFR  4. Coronary artery disease involving left main coronary artery  - pravastatin (PRAVACHOL) 40 MG tablet; Take 1 tablet (40 mg total) by mouth daily.  Dispense: 90 tablet; Refill: 1  5. H/O acute myocardial infarction of lateral wall   6. Angina pectoris syndrome (HCC)  Currently doing well, medication management   7. Dysmetabolic syndrome  - Hemoglobin A1c  8. Dyslipidemia  - Lipid panel  9. Hyperglycemia  - Hemoglobin A1c  10. Paresthesia  - Vitamin B12 

## 2017-09-11 LAB — COMPLETE METABOLIC PANEL WITH GFR
AG Ratio: 1.6 (calc) (ref 1.0–2.5)
ALBUMIN MSPROF: 4.3 g/dL (ref 3.6–5.1)
ALKALINE PHOSPHATASE (APISO): 72 U/L (ref 33–130)
ALT: 20 U/L (ref 6–29)
AST: 17 U/L (ref 10–35)
BILIRUBIN TOTAL: 0.5 mg/dL (ref 0.2–1.2)
BUN: 16 mg/dL (ref 7–25)
CHLORIDE: 101 mmol/L (ref 98–110)
CO2: 25 mmol/L (ref 20–32)
CREATININE: 0.91 mg/dL (ref 0.50–0.99)
Calcium: 9.4 mg/dL (ref 8.6–10.4)
GFR, Est African American: 76 mL/min/{1.73_m2} (ref >=60)
GFR, Est Non African American: 66 mL/min/{1.73_m2} (ref >=60)
GLOBULIN: 2.7 g/dL (ref 1.9–3.7)
Glucose, Bld: 106 mg/dL — ABNORMAL HIGH (ref 65–99)
Potassium: 4.5 mmol/L (ref 3.5–5.3)
SODIUM: 138 mmol/L (ref 135–146)
Total Protein: 7 g/dL (ref 6.1–8.1)

## 2017-09-11 LAB — HEMOGLOBIN A1C
HEMOGLOBIN A1C: 6.2 %{Hb} — AB (ref <5.7)
Mean Plasma Glucose: 131 (calc)
eAG (mmol/L): 7.3 (calc)

## 2017-09-11 LAB — LIPID PANEL
CHOL/HDL RATIO: 5 (calc) — AB (ref <5.0)
Cholesterol: 255 mg/dL — ABNORMAL HIGH (ref <200)
HDL: 51 mg/dL (ref >50)
LDL CHOLESTEROL (CALC): 164 mg/dL — AB
Non-HDL Cholesterol (Calc): 204 mg/dL (calc) — ABNORMAL HIGH (ref <130)
Triglycerides: 238 mg/dL — ABNORMAL HIGH (ref <150)

## 2017-09-11 LAB — VITAMIN B12: Vitamin B-12: 511 pg/mL (ref 200–1100)

## 2017-09-12 ENCOUNTER — Other Ambulatory Visit: Payer: Self-pay

## 2017-09-12 NOTE — Telephone Encounter (Signed)
Looks like it was changed to cyclobenzaprine

## 2017-09-12 NOTE — Telephone Encounter (Signed)
We got a fax from [facility] requesting a refill of Baclofen but I do not see where this is on her current medication list.  Please advise

## 2017-09-13 ENCOUNTER — Other Ambulatory Visit: Payer: Self-pay | Admitting: Family Medicine

## 2017-09-13 MED ORDER — CYCLOBENZAPRINE HCL 10 MG PO TABS: 5.0000 mg | ORAL_TABLET | Freq: Every day | ORAL | 0 refills | Status: DC

## 2017-09-13 NOTE — Telephone Encounter (Signed)
Patient stated that it was her mistake and she was going to call us to let us know. She stated that the cyclobenzaprine is not covered by [facility] so she submitted what she thought was the generic (baclofen) but found that it was the wrong request.   She is asking that a rx for Cyclobenzaprine to be submitted to her local pharmacy ([facility]).  Refill request was sent to Dr. Steele Sizer for approval and submission.

## 2017-09-16 ENCOUNTER — Other Ambulatory Visit: Payer: Self-pay | Admitting: Family Medicine

## 2017-09-16 ENCOUNTER — Telehealth: Payer: Self-pay

## 2017-09-16 NOTE — Telephone Encounter (Signed)
Refill request for general medication. Baclofen to [facility]  Last office visit: 09/10/2017   Follow up on 12/12/2017

## 2017-09-16 NOTE — Telephone Encounter (Signed)
She is now on Flexeril not on baclofen

## 2017-09-17 NOTE — Telephone Encounter (Signed)
[employer] notified she is no longer on Baclofen and to D/C on their list.

## 2017-09-27 ENCOUNTER — Other Ambulatory Visit: Payer: Self-pay | Admitting: Family Medicine

## 2017-09-27 MED ORDER — ROSUVASTATIN CALCIUM 40 MG PO TABS: 40.0000 mg | ORAL_TABLET | Freq: Every day | ORAL | 0 refills | Status: DC

## 2017-10-30 ENCOUNTER — Other Ambulatory Visit: Payer: Self-pay | Admitting: Family Medicine

## 2017-10-30 NOTE — Telephone Encounter (Signed)
Refill Request for Cholesterol medication. Rosuvastatin to [place].   Last visit: 09/10/2017   Lab Results  Component Value Date   CHOL 255 (H) 09/10/2017   HDL 51 09/10/2017   LDLCALC 164 (H) 09/10/2017   TRIG 238 (H) 09/10/2017   CHOLHDL 5.0 (H) 09/10/2017    Follow up 12/12/2017

## 2017-11-08 ENCOUNTER — Encounter: Payer: Self-pay | Admitting: Family Medicine

## 2017-11-11 ENCOUNTER — Other Ambulatory Visit: Payer: Self-pay | Admitting: Family Medicine

## 2017-11-11 DIAGNOSIS — I10 Essential (primary) hypertension: Secondary | ICD-10-CM

## 2017-11-23 DIAGNOSIS — H547 Unspecified visual loss: Secondary | ICD-10-CM | POA: Diagnosis not present

## 2017-11-23 DIAGNOSIS — F418 Other specified anxiety disorders: Secondary | ICD-10-CM | POA: Diagnosis not present

## 2017-11-23 DIAGNOSIS — I252 Old myocardial infarction: Secondary | ICD-10-CM | POA: Diagnosis not present

## 2017-11-23 DIAGNOSIS — E663 Overweight: Secondary | ICD-10-CM | POA: Diagnosis not present

## 2017-11-23 DIAGNOSIS — M19041 Primary osteoarthritis, right hand: Secondary | ICD-10-CM | POA: Diagnosis not present

## 2017-11-23 DIAGNOSIS — F1020 Alcohol dependence, uncomplicated: Secondary | ICD-10-CM | POA: Diagnosis not present

## 2017-11-23 DIAGNOSIS — F330 Major depressive disorder, recurrent, mild: Secondary | ICD-10-CM | POA: Diagnosis not present

## 2017-11-23 DIAGNOSIS — E785 Hyperlipidemia, unspecified: Secondary | ICD-10-CM | POA: Diagnosis not present

## 2017-11-23 DIAGNOSIS — I10 Essential (primary) hypertension: Secondary | ICD-10-CM | POA: Diagnosis not present

## 2017-11-26 ENCOUNTER — Other Ambulatory Visit: Payer: Self-pay | Admitting: Family Medicine

## 2017-12-12 ENCOUNTER — Ambulatory Visit: Payer: Medicare PPO

## 2017-12-12 ENCOUNTER — Encounter: Payer: Self-pay | Admitting: Family Medicine

## 2017-12-12 ENCOUNTER — Ambulatory Visit: Payer: Medicare PPO | Admitting: Family Medicine

## 2017-12-12 VITALS — BP 122/76 | HR 105 | Temp 98.0°F | Resp 16 | Ht 61.0 in | Wt 153.1 lb

## 2017-12-12 DIAGNOSIS — F320 Major depressive disorder, single episode, mild: Secondary | ICD-10-CM | POA: Diagnosis not present

## 2017-12-12 DIAGNOSIS — I10 Essential (primary) hypertension: Secondary | ICD-10-CM | POA: Diagnosis not present

## 2017-12-12 DIAGNOSIS — E785 Hyperlipidemia, unspecified: Secondary | ICD-10-CM

## 2017-12-12 DIAGNOSIS — I209 Angina pectoris, unspecified: Secondary | ICD-10-CM

## 2017-12-12 DIAGNOSIS — E8881 Metabolic syndrome: Secondary | ICD-10-CM

## 2017-12-12 DIAGNOSIS — M545 Low back pain, unspecified: Secondary | ICD-10-CM

## 2017-12-12 DIAGNOSIS — M19131 Post-traumatic osteoarthritis, right wrist: Secondary | ICD-10-CM | POA: Diagnosis not present

## 2017-12-12 DIAGNOSIS — M65311 Trigger thumb, right thumb: Secondary | ICD-10-CM | POA: Diagnosis not present

## 2017-12-12 DIAGNOSIS — I2510 Atherosclerotic heart disease of native coronary artery without angina pectoris: Secondary | ICD-10-CM

## 2017-12-12 DIAGNOSIS — G5601 Carpal tunnel syndrome, right upper limb: Secondary | ICD-10-CM

## 2017-12-12 DIAGNOSIS — G4709 Other insomnia: Secondary | ICD-10-CM

## 2017-12-12 MED ORDER — ACETAMINOPHEN 500 MG PO TABS: 500.0000 mg | ORAL_TABLET | Freq: Four times a day (QID) | ORAL | 1 refills | Status: DC

## 2017-12-12 MED ORDER — CYCLOBENZAPRINE HCL 10 MG PO TABS: 5.0000 mg | ORAL_TABLET | Freq: Every day | ORAL | 0 refills | Status: DC

## 2017-12-12 MED ORDER — LISINOPRIL 10 MG PO TABS: 10.0000 mg | ORAL_TABLET | Freq: Every day | ORAL | 0 refills | Status: DC

## 2017-12-12 MED ORDER — CARVEDILOL 3.125 MG PO TABS: 3.1250 mg | ORAL_TABLET | Freq: Two times a day (BID) | ORAL | 0 refills | Status: DC

## 2017-12-12 MED ORDER — ALPRAZOLAM 0.5 MG PO TABS: 0.2500 mg | ORAL_TABLET | Freq: Every day | ORAL | 0 refills | Status: DC | PRN

## 2017-12-12 MED ORDER — ROSUVASTATIN CALCIUM 40 MG PO TABS: 40.0000 mg | ORAL_TABLET | Freq: Every day | ORAL | 1 refills | Status: DC
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
## 2018-02-11 ENCOUNTER — Other Ambulatory Visit: Payer: Self-pay | Admitting: Family Medicine

## 2018-02-11 DIAGNOSIS — Z1231 Encounter for screening mammogram for malignant neoplasm of breast: Secondary | ICD-10-CM

## 2018-02-25 ENCOUNTER — Telehealth: Payer: Self-pay | Admitting: Family Medicine

## 2018-02-25 NOTE — Telephone Encounter (Signed)
I called the patient to schedule her AWV-I with Nurse Health Advisor, [place].  She said that she had it with [facility] in Sep. 2019, so I explained that we can complete it here in the office later in the year (around Sep. 2020). VDM (DD)

## 2018-04-02 ENCOUNTER — Other Ambulatory Visit: Payer: Self-pay | Admitting: Family Medicine

## 2018-04-02 ENCOUNTER — Other Ambulatory Visit: Payer: Self-pay

## 2018-04-02 ENCOUNTER — Ambulatory Visit
Admission: RE | Admit: 2018-04-02 | Discharge: 2018-04-02 | Disposition: A | Discharge status: Home / Self Care | Payer: Medicare PPO | Source: Ambulatory Visit | Attending: Family Medicine | Admitting: Family Medicine

## 2018-04-02 DIAGNOSIS — Z1231 Encounter for screening mammogram for malignant neoplasm of breast: Secondary | ICD-10-CM | POA: Insufficient documentation

## 2018-04-02 DIAGNOSIS — G4709 Other insomnia: Secondary | ICD-10-CM

## 2018-04-02 NOTE — Telephone Encounter (Signed)
Previous rx lasted much longer, what happened?

## 2018-04-02 NOTE — Telephone Encounter (Signed)
Patient called, left VM to return call to the office for a message from Dr. Ancil Boozer.

## 2018-04-02 NOTE — Telephone Encounter (Signed)
Left message for patient to call us back. So we are able to ask does she have enough Alprazolam to last her until her appointment?

## 2018-04-02 NOTE — Telephone Encounter (Signed)
Refill request for general medication. Alprazolam to [facility].   Last office visit 12/12/2017   Follow up on 04/11/2018

## 2018-04-03 NOTE — Telephone Encounter (Signed)
See mesage

## 2018-04-03 NOTE — Telephone Encounter (Signed)
Copied from [place] [id number]. Topic: Conservator, museum/gallery Patient (Clinic Use ONLY) >> Apr 02, 2018  5:27 PM Vonna Kotyk, CMA wrote: Reason for CRM: Left message for patient to call us back. So we are able to ask does she have enough Alprazolam to last her until her appointment? >> Apr 03, 2018 11:36 AM Gustavus Messing wrote: Patient only has enough Alprazolam for 2 days and her appointment is not until the 20th. Patient stated that she has been taking more because her father passed. Will need more before her appointment

## 2018-04-11 ENCOUNTER — Ambulatory Visit: Payer: Medicare PPO | Admitting: Family Medicine

## 2018-05-01 ENCOUNTER — Other Ambulatory Visit: Payer: Self-pay

## 2018-05-01 ENCOUNTER — Ambulatory Visit (INDEPENDENT_AMBULATORY_CARE_PROVIDER_SITE_OTHER): Payer: Medicare PPO | Admitting: Family Medicine

## 2018-05-01 ENCOUNTER — Encounter: Payer: Self-pay | Admitting: Family Medicine

## 2018-05-01 DIAGNOSIS — E785 Hyperlipidemia, unspecified: Secondary | ICD-10-CM | POA: Diagnosis not present

## 2018-05-01 DIAGNOSIS — F320 Major depressive disorder, single episode, mild: Secondary | ICD-10-CM | POA: Diagnosis not present

## 2018-05-01 DIAGNOSIS — M545 Low back pain, unspecified: Secondary | ICD-10-CM

## 2018-05-01 DIAGNOSIS — G4709 Other insomnia: Secondary | ICD-10-CM

## 2018-05-01 DIAGNOSIS — I10 Essential (primary) hypertension: Secondary | ICD-10-CM | POA: Diagnosis not present

## 2018-05-01 DIAGNOSIS — I209 Angina pectoris, unspecified: Secondary | ICD-10-CM

## 2018-05-01 MED ORDER — CYCLOBENZAPRINE HCL 10 MG PO TABS: 5.0000 mg | ORAL_TABLET | Freq: Every day | ORAL | 0 refills | Status: DC

## 2018-05-01 MED ORDER — CARVEDILOL 3.125 MG PO TABS: 3.1250 mg | ORAL_TABLET | Freq: Two times a day (BID) | ORAL | 0 refills | Status: DC

## 2018-05-01 MED ORDER — ROSUVASTATIN CALCIUM 20 MG PO TABS: 20.0000 mg | ORAL_TABLET | Freq: Every day | ORAL | 0 refills | Status: DC

## 2018-05-01 MED ORDER — LISINOPRIL 10 MG PO TABS: 10.0000 mg | ORAL_TABLET | Freq: Every day | ORAL | 0 refills | Status: DC

## 2018-05-01 MED ORDER — ALPRAZOLAM 0.5 MG PO TABS: 0.5000 mg | ORAL_TABLET | Freq: Every day | ORAL | 0 refills | Status: DC | PRN

## 2018-05-01 MED ORDER — BUPROPION HCL ER (XL) 300 MG PO TB24: 300.0000 mg | ORAL_TABLET | Freq: Every day | ORAL | 1 refills | Status: DC

## 2018-05-01 MED ORDER — NITROGLYCERIN 0.4 MG SL SUBL: 0.4000 mg | SUBLINGUAL_TABLET | SUBLINGUAL | 2 refills | Status: DC | PRN

## 2018-05-01 NOTE — Progress Notes (Signed)
Name: Elizabeth Johnson   MRN: [id number]    DOB: 21-Oct-1950   Date:05/01/2018       Progress Note  Subjective  Chief Complaint  Chief Complaint  Patient presents with  . Hypertension  . Depression  . dyslipidemia  . Medication Management    either her statin or Carvedilol is causing her muscle aches and dizziness.    I connected with@ on 05/01/18 at  9:00 AM EDT by telephone and verified that I am speaking with the correct person using two identifiers.  I discussed the limitations, risks, security and privacy concerns of performing an evaluation and management service by telephone and the availability of in person appointments. Staff also discussed with the patient that there may be a patient responsible charge related to this service. Patient Location: at home  Provider Location: [facility]   HPI   Hyperlipidemia: she was on Pravastatin and we changed to Crestor, and she has noticed myalgia again, we will decrease dose to 20 mg and recheck labs next visit  HTN: patient is doing well, no chest pain , palpitation or SOB.She states mild dizziness when she gets up quickly , but not on a regular basis, stay hydrated, she will monitor at home , she just ordered a monitor and if needed we will go down on lisinopril to 5 mg   Major Depression: finally adjusting on not working, forced to retired back in 2017, happy that she does not have to work during COVID-19   Back pain: she has mild low back pain, Flexeril qhs prn only, stable . Described as spasms, no radiculitis  Insomnia: she takes alprazolam very seldom and needs a refill,she was given 5 pills May 2019, she just requested a refill last week, however I will give her 30 more because of COVID-19 causing a little more stress.   Angina pectoris: history of MI, denies recent chest pain, has NTG at home, on beta-blocker, statin therapy and ACE, also takes aspirin. Not seeing cardiologist at this time   Hyperglycemia: denies polyphagia, polydipsia or polyuria, we will recheck labs next visit    Patient Active Problem List   Diagnosis Date Noted  . Trigger thumb of right hand 12/12/2017  . Post-traumatic osteoarthritis of right wrist 12/12/2017  . Biceps tendon rupture 09/21/2015  . H/O hysterectomy for benign disease 09/02/2014  . Acquired bilateral hammer toes 09/02/2014  . External hemorrhoid 09/02/2014  . Allergic rhinitis 07/19/2014  . Carpal tunnel syndrome on right 07/19/2014  . Insomnia, persistent 07/19/2014  . Chronic LBP 07/19/2014  . Mild major depression ([place]) 07/19/2014  . Dyslipidemia 07/19/2014  . Hemorrhoid 07/19/2014  . Dysmetabolic syndrome 80/03/4915  . 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 Osteopenia 07/19/2014  . Benign essential HTN 02/04/2009  . H/O acute myocardial infarction of lateral wall 07/18/2004    Past Surgical History:  Procedure Laterality Date  . ABDOMINAL HYSTERECTOMY    . BUNIONECTOMY Bilateral 1973  . CARPAL TUNNEL RELEASE Right 04/15/2017   Procedure: CARPAL TUNNEL RELEASE;  Surgeon: Earnestine Leys, MD;  Location: [facility] ORS;  Service: Orthopedics;  Laterality: Right;  . COLONOSCOPY WITH PROPOFOL N/A 11/23/2016   Procedure: COLONOSCOPY WITH PROPOFOL;  Surgeon: [name], MD;  Location: [facility] ENDOSCOPY;  Service: Gastroenterology;  Laterality: N/A;  . CORONARY STENT PLACEMENT  2009  . ELBOW SURGERY Left 1977  . HAMMER TOE SURGERY Right 1992  . RIB FRACTURE SURGERY    . WRIST FRACTURE SURGERY Right  1977    Family History  Problem Relation Age of Onset  . Alzheimer's disease Mother   . Breast cancer Mother 11  . CAD Father   . Heart attack Father   . Heart failure Father   . COPD Father   . Drug abuse Sister   . Suicidality Sister   . Heart attack Brother     Social History   Socioeconomic History  . Marital status: Married    Spouse name: Not on file  . Number of children: 1  . Years of  education: Not on file  . Highest education level: Not on file  Occupational History  . Occupation: retired   Scientific laboratory technician  . Financial resource strain: Not hard at all  . Food insecurity:    Worry: Never true    Inability: Never true  . Transportation needs:    Medical: No    Non-medical: No  Tobacco Use  . Smoking status: Former Smoker    Years: 8.00    Types: Cigarettes    Start date: 01/23/1999    Last attempt to quit: 05/23/2007    Years since quitting: 10.9  . Smokeless tobacco: Never Used  Substance and Sexual Activity  . Alcohol use: Yes    Alcohol/week: 0.0 standard drinks    Comment: occasional  . Drug use: No  . Sexual activity: Yes    Partners: Male  Lifestyle  . Physical activity:    Days per week: 0 days    Minutes per session: 0 min  . Stress: Only a little  Relationships  . Social connections:    Talks on phone: Not on file    Gets together: Not on file    Attends religious service: Not on file    Active member of club or organization: Not on file    Attends meetings of clubs or organizations: Not on file    Relationship status: Not on file  . Intimate partner violence:    Fear of current or ex partner: No    Emotionally abused: No    Physically abused: No    Forced sexual activity: No  Other Topics Concern  . Not on file  Social History Narrative   She worked for the same company for 20 years and they closed [place] branch and she was forced to retire Fall 2017. When her position was moved to [facility]    Married      Current Outpatient Medications:  .  acetaminophen (TYLENOL) 500 MG tablet, Take 1 tablet (500 mg total) by mouth every 6 (six) hours., Disp: 360 tablet, Rfl: 1 .  ALPRAZolam (XANAX) 0.5 MG tablet, TAKE 1/2 TABLET (0.25MG TOTAL) BY MOUTH DAILY AS NEEDED FOR ANXIETY, Disp: 8 tablet, Rfl: 0 .  aspirin 81 MG chewable tablet, Chew 81 mg by mouth daily. , Disp: , Rfl:  .  Black Cohosh 540 MG CAPS, Take 540 mg by mouth daily., Disp: , Rfl:  .   buPROPion (WELLBUTRIN XL) 300 MG 24 hr tablet, Take 1 tablet (300 mg total) by mouth daily., Disp: 90 tablet, Rfl: 1 .  cyclobenzaprine (FLEXERIL) 10 MG tablet, Take 0.5-1 tablets (5-10 mg total) by mouth at bedtime., Disp: 90 tablet, Rfl: 0 .  Lancets Misc. (ACCU-CHEK SOFTCLIX LANCET DEV) KIT, , Disp: , Rfl:  .  lisinopril (PRINIVIL,ZESTRIL) 10 MG tablet, Take 1 tablet (10 mg total) by mouth daily., Disp: 90 tablet, Rfl: 0 .  nitroGLYCERIN (NITROSTAT) 0.4 MG SL tablet, Place 1 tablet (  0.4 mg total) under the tongue every 5 (five) minutes as needed for chest pain., Disp: 10 tablet, Rfl: 2 .  Omega-3 Fatty Acids (FISH OIL PO), Take 1 capsule by mouth daily., Disp: , Rfl:  .  rosuvastatin (CRESTOR) 40 MG tablet, Take 1 tablet (40 mg total) by mouth daily., Disp: 90 tablet, Rfl: 1 .  Blood Glucose Calibration (ACCU-CHEK SMARTVIEW CONTROL) LIQD, 1 each by In Vitro route daily. (Patient not taking: Reported on 12/12/2017), Disp: 200 each, Rfl: 1 .  Blood Glucose Monitoring Suppl (ACCU-CHEK NANO SMARTVIEW) w/Device KIT, 1 kit by Does not apply route daily. (Patient not taking: Reported on 12/12/2017), Disp: 1 kit, Rfl: 0 .  carvedilol (COREG) 3.125 MG tablet, Take 1 tablet (3.125 mg total) by mouth 2 (two) times daily with a meal., Disp: 180 tablet, Rfl: 0  Allergies  Allergen Reactions  . Codeine Other (See Comments)    Sensitivity. Nauseated   . Crestor [Rosuvastatin Calcium] Other (See Comments)    Muscle cramp    I personally reviewed active problem list, medication list, allergies, family history, social history with the patient/caregiver today.   ROS  Constitutional: Negative for fever or weight change.  Respiratory: Negative for cough and shortness of breath.   Cardiovascular: Negative for chest pain or palpitations.  Gastrointestinal: Negative for abdominal pain, no bowel changes.  Musculoskeletal: Negative for gait problem or joint swelling.  Skin: Negative for rash.  Neurological:  Negative for dizziness or headache.  No other specific complaints in a complete review of systems (except as listed in HPI above).  Objective  Virtual encounter, vitals not obtained.  Body mass index is 28.15 kg/m.  Physical Exam  Awake, alert, does not seem to be in distress   PHQ2/9: Depression screen [facility] 2/9 05/01/2018 09/10/2017 01/07/2017 10/16/2016 09/21/2015  Decreased Interest 0 0 2 1 0  Down, Depressed, Hopeless 2 0 2 1 0  PHQ - 2 Score 2 0 4 2 0  Altered sleeping 0 '2 2 3 ' -  Tired, decreased energy '2 2 3 2 ' -  Change in appetite 0 0 0 0 -  Feeling bad or failure about yourself  0 '1 2 3 ' -  Trouble concentrating - 0 0 0 -  Moving slowly or fidgety/restless 0 0 0 0 -  Suicidal thoughts 0 0 0 0 -  PHQ-9 Score '4 5 11 10 ' -  Difficult doing work/chores Not difficult at all Not difficult at all Somewhat difficult Somewhat difficult -   PHQ-2/9 Result is positive.    Fall Risk: Fall Risk  12/12/2017 09/10/2017 01/07/2017 10/16/2016 09/21/2015  Falls in the past year? 0 No No Yes No  Number falls in past yr: 0 - - 1 -  Injury with Fall? 0 - - No -     Assessment & Plan  1. Angina pectoris syndrome (HCC)  - nitroGLYCERIN (NITROSTAT) 0.4 MG SL tablet; Place 1 tablet (0.4 mg total) under the tongue every 5 (five) minutes as needed for chest pain.  Dispense: 10 tablet; Refill: 2  2. Dyslipidemia  - rosuvastatin (CRESTOR) 40 MG tablet; Take 1 tablet (40 mg total) by mouth daily.  Dispense: 90 tablet; Refill: 1  3. Benign essential HTN  - lisinopril (PRINIVIL,ZESTRIL) 10 MG tablet; Take 1 tablet (10 mg total) by mouth daily.  Dispense: 90 tablet; Refill: 0 - carvedilol (COREG) 3.125 MG tablet; Take 1 tablet (3.125 mg total) by mouth 2 (two) times daily with a meal.  Dispense: 180 tablet; Refill:  0  4. Intermittent low back pain  - cyclobenzaprine (FLEXERIL) 10 MG tablet; Take 0.5-1 tablets (5-10 mg total) by mouth at bedtime.  Dispense: 90 tablet; Refill: 0  5. Mild major  depression (HCC)  - buPROPion (WELLBUTRIN XL) 300 MG 24 hr tablet; Take 1 tablet (300 mg total) by mouth daily.  Dispense: 90 tablet; Refill: 1   I discussed the assessment and treatment plan with the patient. The patient was provided an opportunity to ask questions and all were answered. The patient agreed with the plan and demonstrated an understanding of the instructions.   The patient was advised to call back or seek an in-person evaluation if the symptoms worsen or if the condition fails to improve as anticipated.  I provided 20 minutes of non-face-to-face time during this encounter.  Loistine Chance, MD

## 2018-08-02 ENCOUNTER — Other Ambulatory Visit: Payer: Self-pay | Admitting: Family Medicine

## 2018-08-02 DIAGNOSIS — E785 Hyperlipidemia, unspecified: Secondary | ICD-10-CM

## 2018-08-02 DIAGNOSIS — I10 Essential (primary) hypertension: Secondary | ICD-10-CM

## 2018-08-08 ENCOUNTER — Telehealth: Payer: Self-pay

## 2018-08-08 NOTE — Telephone Encounter (Signed)
Copied from [place] [id number]. Topic: General - Other >> Aug 08, 2018 12:38 PM Mcneil, Ja-Kwan wrote: Reason for CRM: Pt stated her insurance provided 3 different medications (Sertraline HCL, Escitalopram Oxalate, and Citalopram) that are similar to buPROPion (WELLBUTRIN XL) 300 MG 24 hr tablet and they would cost less. Pt stated she would like to have the new Rx for 1 of the above medications sent to [facility]

## 2018-08-10 ENCOUNTER — Other Ambulatory Visit: Payer: Self-pay | Admitting: Family Medicine

## 2018-08-10 MED ORDER — BUPROPION HCL ER (XL) 150 MG PO TB24: 300.0000 mg | ORAL_TABLET | Freq: Every day | ORAL | 0 refills | Status: DC

## 2018-08-11 NOTE — Telephone Encounter (Signed)
Called patient lvm in regards to Wellbutrin.

## 2018-08-14 ENCOUNTER — Ambulatory Visit: Payer: Medicare PPO | Admitting: Family Medicine

## 2018-08-20 ENCOUNTER — Ambulatory Visit (INDEPENDENT_AMBULATORY_CARE_PROVIDER_SITE_OTHER): Payer: Medicare PPO | Admitting: Family Medicine

## 2018-08-20 ENCOUNTER — Other Ambulatory Visit: Payer: Self-pay

## 2018-08-20 DIAGNOSIS — I209 Angina pectoris, unspecified: Secondary | ICD-10-CM

## 2018-08-20 DIAGNOSIS — Z23 Encounter for immunization: Secondary | ICD-10-CM | POA: Diagnosis not present

## 2018-08-20 DIAGNOSIS — E785 Hyperlipidemia, unspecified: Secondary | ICD-10-CM | POA: Diagnosis not present

## 2018-08-20 DIAGNOSIS — M545 Low back pain, unspecified: Secondary | ICD-10-CM

## 2018-08-20 DIAGNOSIS — I10 Essential (primary) hypertension: Secondary | ICD-10-CM

## 2018-08-20 DIAGNOSIS — R739 Hyperglycemia, unspecified: Secondary | ICD-10-CM

## 2018-08-20 DIAGNOSIS — G4709 Other insomnia: Secondary | ICD-10-CM | POA: Diagnosis not present

## 2018-08-20 DIAGNOSIS — F320 Major depressive disorder, single episode, mild: Secondary | ICD-10-CM

## 2018-08-20 MED ORDER — LISINOPRIL 10 MG PO TABS: 10.0000 mg | ORAL_TABLET | Freq: Every day | ORAL | 1 refills | Status: DC

## 2018-08-20 MED ORDER — CYCLOBENZAPRINE HCL 10 MG PO TABS: 5.0000 mg | ORAL_TABLET | Freq: Every day | ORAL | 1 refills | Status: DC

## 2018-08-20 MED ORDER — ROSUVASTATIN CALCIUM 20 MG PO TABS: 20.0000 mg | ORAL_TABLET | Freq: Every day | ORAL | 1 refills | Status: DC

## 2018-08-20 MED ORDER — CARVEDILOL 3.125 MG PO TABS: 3.1250 mg | ORAL_TABLET | Freq: Two times a day (BID) | ORAL | 1 refills | Status: DC

## 2018-08-20 MED ORDER — BUPROPION HCL ER (XL) 300 MG PO TB24: 300.0000 mg | ORAL_TABLET | Freq: Every day | ORAL | 1 refills | Status: DC

## 2018-08-20 MED ORDER — ALPRAZOLAM 0.5 MG PO TABS: 0.5000 mg | ORAL_TABLET | Freq: Every day | ORAL | 0 refills | Status: DC | PRN

## 2018-08-20 NOTE — Progress Notes (Signed)
Name: Elizabeth Johnson   MRN: [id number]    DOB: 1950-11-14   Date:08/20/2018       Progress Note  Subjective  Chief Complaint  No chief complaint on file.   I connected with  Elizabeth Johnson  on 08/20/18 at  3:20 PM EDT by a telephone encounter and verified that I am speaking with the correct person using two identifiers.  I discussed the limitations of evaluation and management by telemedicine and the availability of in person appointments. The patient expressed understanding and agreed to proceed. Staff also discussed with the patient that there may be a patient responsible charge related to this service. Patient Location: at home  Provider Location: [facility]    HPI  Hyperlipidemia: shewas on Pravastatin and we changed to Crestor, and she has noticed myalgia again, we will decrease dose to 20 mg , we were going to recheck labs today, but she did a virtual visit, next visit will be in person  HTN: patient is doing well, no chest pain , palpitation or SOB.She states mild dizziness when she gets up quickly , but not on a regular basis, stay hydrated, she will monitor at home , she states she checks bp at home and it has been well controlled, but did not have the values with her during our conversation today   Major Depression: finally adjusting on not working, forced to retired back in 2017. Her insurance denied coverage of Wellbutrin XL 300 mg, they recommended to switch to sertraline, citalopram or escitalopram. Explained not the same, discussed [place] and she will pay cash for medication   Back pain: she has mild low back pain, Flexeril qhs prn only, stable . Described as spasms, no radiculitis. Unchanged  Insomnia: she takes alprazolam very seldom and needs a refill,she was given 5 pills May 2019, she has gone through 30 pills in the past 3 months, states secondary to COVID-19 and would like a refill to take it prn, advised to try to make it last, but okay in  giving 30 for 90 days   Angina pectoris: history of MI, denies recent chest pain, has NTG at home, on beta-blocker, statin therapy and ACE, also takes aspirin. Not seeing cardiologist at this time. We will recheck lipid panel on her next visit in October  Hyperglycemia: denies polyphagia, polydipsia or polyuria, we will recheck labs next visit - she states she prefers to get labs when she comes in the office    Patient Active Problem List   Diagnosis Date Noted  . Trigger thumb of right hand 12/12/2017  . Post-traumatic osteoarthritis of right wrist 12/12/2017  . Biceps tendon rupture 09/21/2015  . H/O hysterectomy for benign disease 09/02/2014  . Acquired bilateral hammer toes 09/02/2014  . External hemorrhoid 09/02/2014  . Allergic rhinitis 07/19/2014  . Carpal tunnel syndrome on right 07/19/2014  . Insomnia, persistent 07/19/2014  . Chronic LBP 07/19/2014  . Mild major depression ([place]) 07/19/2014  . Dyslipidemia 07/19/2014  . Hemorrhoid 07/19/2014  . Dysmetabolic syndrome 38/25/0539  . 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 Osteopenia 07/19/2014  . Benign essential HTN 02/04/2009  . H/O acute myocardial infarction of lateral wall 07/18/2004    Past Surgical History:  Procedure Laterality Date  . ABDOMINAL HYSTERECTOMY    . BUNIONECTOMY Bilateral 1973  . CARPAL TUNNEL RELEASE Right 04/15/2017   Procedure: CARPAL TUNNEL RELEASE;  Surgeon: Earnestine Leys, MD;  Location: [facility] ORS;  Service: Orthopedics;  Laterality: Right;  . COLONOSCOPY WITH PROPOFOL N/A 11/23/2016   Procedure: COLONOSCOPY WITH PROPOFOL;  Surgeon: [name], MD;  Location: [facility] ENDOSCOPY;  Service: Gastroenterology;  Laterality: N/A;  . CORONARY STENT PLACEMENT  2009  . ELBOW SURGERY Left 1977  . HAMMER TOE SURGERY Right 1992  . RIB FRACTURE SURGERY    . WRIST FRACTURE SURGERY Right 1977    Family History  Problem Relation Age of Onset  . Alzheimer's disease Mother    . Breast cancer Mother 87  . CAD Father   . Heart attack Father   . Heart failure Father   . COPD Father   . Drug abuse Sister   . Suicidality Sister   . Heart attack Brother     Social History   Socioeconomic History  . Marital status: Married    Spouse name: Not on file  . Number of children: 1  . Years of education: Not on file  . Highest education level: Not on file  Occupational History  . Occupation: retired   Scientific laboratory technician  . Financial resource strain: Not hard at all  . Food insecurity    Worry: Never true    Inability: Never true  . Transportation needs    Medical: No    Non-medical: No  Tobacco Use  . Smoking status: Former Smoker    Years: 8.00    Types: Cigarettes    Start date: 01/23/1999    Quit date: 05/23/2007    Years since quitting: 11.2  . Smokeless tobacco: Never Used  Substance and Sexual Activity  . Alcohol use: Yes    Alcohol/week: 0.0 standard drinks    Comment: occasional  . Drug use: No  . Sexual activity: Yes    Partners: Male  Lifestyle  . Physical activity    Days per week: 0 days    Minutes per session: 0 min  . Stress: Only a little  Relationships  . Social Herbalist on phone: Not on file    Gets together: Not on file    Attends religious service: Not on file    Active member of club or organization: Not on file    Attends meetings of clubs or organizations: Not on file    Relationship status: Not on file  . Intimate partner violence    Fear of current or ex partner: No    Emotionally abused: No    Physically abused: No    Forced sexual activity: No  Other Topics Concern  . 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 When her position was moved to [facility] [employer]    Married      Current Outpatient Medications:  .  acetaminophen (TYLENOL) 500 MG tablet, Take 1 tablet (500 mg total) by mouth every 6 (six) hours.,  Disp: 360 tablet, Rfl: 1 .  ALPRAZolam (XANAX) 0.5 MG tablet, Take 1 tablet (0.5 mg total) by mouth daily as needed for anxiety., Disp: 30 tablet, Rfl: 0 .  aspirin 81 MG chewable tablet, Chew 81 mg by mouth daily. , Disp: , Rfl:  .  Black Cohosh 540 MG CAPS, Take 540 mg by mouth daily., Disp: , Rfl:  .  Blood Glucose Calibration (ACCU-CHEK SMARTVIEW CONTROL) LIQD, 1 each by In Vitro route daily. (Patient not taking: Reported on 12/12/2017), Disp: 200 each, Rfl: 1 .  Blood  Glucose Monitoring Suppl (ACCU-CHEK NANO SMARTVIEW) w/Device KIT, 1 kit by Does not apply route daily. (Patient not taking: Reported on 12/12/2017), Disp: 1 kit, Rfl: 0 .  buPROPion (WELLBUTRIN XL) 300 MG 24 hr tablet, Take 1 tablet (300 mg total) by mouth daily., Disp: 90 tablet, Rfl: 1 .  carvedilol (COREG) 3.125 MG tablet, Take 1 tablet (3.125 mg total) by mouth 2 (two) times daily with a meal., Disp: 180 tablet, Rfl: 1 .  cyclobenzaprine (FLEXERIL) 10 MG tablet, Take 0.5-1 tablets (5-10 mg total) by mouth at bedtime., Disp: 90 tablet, Rfl: 1 .  Lancets Misc. (ACCU-CHEK SOFTCLIX LANCET DEV) KIT, , Disp: , Rfl:  .  lisinopril (ZESTRIL) 10 MG tablet, Take 1 tablet (10 mg total) by mouth daily., Disp: 90 tablet, Rfl: 1 .  nitroGLYCERIN (NITROSTAT) 0.4 MG SL tablet, Place 1 tablet (0.4 mg total) under the tongue every 5 (five) minutes as needed for chest pain., Disp: 10 tablet, Rfl: 2 .  Omega-3 Fatty Acids (FISH OIL PO), Take 1 capsule by mouth daily., Disp: , Rfl:  .  rosuvastatin (CRESTOR) 20 MG tablet, Take 1 tablet (20 mg total) by mouth daily., Disp: 90 tablet, Rfl: 1  Allergies  Allergen Reactions  . Codeine Other (See Comments)    Sensitivity. Nauseated   . Crestor [Rosuvastatin Calcium] Other (See Comments)    Muscle cramp    I personally reviewed active problem list, medication list, allergies, family history, social history with the patient/caregiver today.   ROS  Ten systems reviewed and is negative except as  mentioned in HPI   Objective  Virtual encounter, vitals not obtained.  There is no height or weight on file to calculate BMI.  Physical Exam  Awake, alert and oriented   PHQ2/9: Depression screen [facility] 2/9 05/01/2018 09/10/2017 01/07/2017 10/16/2016 09/21/2015  Decreased Interest 0 0 2 1 0  Down, Depressed, Hopeless 2 0 2 1 0  PHQ - 2 Score 2 0 4 2 0  Altered sleeping 0 _0 -  Tired, decreased energy _1 -  Change in appetite 0 0 0 0 -  Feeling bad or failure about yourself  0 _2 -  Trouble concentrating - 0 0 0 -  Moving slowly or fidgety/restless 0 0 0 0 -  Suicidal thoughts 0 0 0 0 -  PHQ-9 Score _3 -  Difficult doing work/chores Not difficult at all Not difficult at all Somewhat difficult Somewhat difficult -   PHQ-2/9 Result is positive.    Fall Risk: Fall Risk  12/12/2017 09/10/2017 01/07/2017 10/16/2016 09/21/2015  Falls in the past year? 0 No No Yes No  Number falls in past yr: 0 - - 1 -  Injury with Fall? 0 - - No -     Assessment & Plan  1. Benign essential HTN  - carvedilol (COREG) 3.125 MG tablet; Take 1 tablet (3.125 mg total) by mouth 2 (two) times daily with a meal.  Dispense: 180 tablet; Refill: 1 - lisinopril (ZESTRIL) 10 MG tablet; Take 1 tablet (10 mg total) by mouth daily.  Dispense: 90 tablet; Refill: 1  2. Need for Tdap vaccination  Not given , she will check coverage with insurance  3. Angina pectoris   Doing well at this time  4. Dyslipidemia  - rosuvastatin (CRESTOR) 20 MG tablet; Take 1 tablet (20 mg total) by mouth daily.  Dispense: 90 tablet; Refill: 1  5. Mild major depression ([place])  -  buPROPion (WELLBUTRIN XL) 300 MG 24 hr tablet; Take 1 tablet (300 mg total) by mouth daily.  Dispense: 90 tablet; Refill: 1  6. Intermittent low back pain  - cyclobenzaprine (FLEXERIL) 10 MG tablet; Take 0.5-1 tablets (5-10 mg total) by mouth at bedtime.  Dispense: 90 tablet; Refill: 1  7. Hyperglycemia   8. Other insomnia  -  ALPRAZolam (XANAX) 0.5 MG tablet; Take 1 tablet (0.5 mg total) by mouth daily as needed for anxiety.  Dispense: 30 tablet; Refill: 0 I discussed the assessment and treatment plan with the patient. The patient was provided an opportunity to ask questions and all were answered. The patient agreed with the plan and demonstrated an understanding of the instructions.  The patient was advised to call back or seek an in-person evaluation if the symptoms worsen or if the condition fails to improve as anticipated.  I provided 25  minutes of non-face-to-face time during this encounter.

## 2018-10-31 ENCOUNTER — Telehealth: Payer: Self-pay

## 2018-10-31 NOTE — Telephone Encounter (Signed)
Copied from [place] [id number]. Topic: General - Other >> Oct 31, 2018 11:29 AM Yvette Rack wrote: Reason for CRM: Pt called to request an appt for labs. Pt would like an order placed for labs. Pt requests call back once the lab order is placed

## 2018-11-03 ENCOUNTER — Other Ambulatory Visit: Payer: Self-pay | Admitting: Family Medicine

## 2018-11-03 DIAGNOSIS — I10 Essential (primary) hypertension: Secondary | ICD-10-CM

## 2018-11-03 DIAGNOSIS — E785 Hyperlipidemia, unspecified: Secondary | ICD-10-CM

## 2018-11-03 DIAGNOSIS — R739 Hyperglycemia, unspecified: Secondary | ICD-10-CM

## 2018-11-03 DIAGNOSIS — E538 Deficiency of other specified B group vitamins: Secondary | ICD-10-CM

## 2018-11-03 NOTE — Telephone Encounter (Signed)
lvm for to  Inform pt that her labs have been ordered and to also call back to schedule an appt

## 2018-11-12 ENCOUNTER — Ambulatory Visit: Payer: Medicare PPO

## 2018-11-19 ENCOUNTER — Ambulatory Visit: Payer: Medicare PPO

## 2019-02-06 ENCOUNTER — Ambulatory Visit: Payer: Medicare Other | Attending: Internal Medicine

## 2019-02-06 DIAGNOSIS — Z20822 Contact with and (suspected) exposure to covid-19: Secondary | ICD-10-CM

## 2019-02-07 LAB — NOVEL CORONAVIRUS, NAA: SARS-CoV-2, NAA: DETECTED — AB

## 2019-02-08 ENCOUNTER — Telehealth: Payer: Self-pay | Admitting: Nurse Practitioner

## 2019-02-08 NOTE — Telephone Encounter (Signed)
Called to discuss with Elizabeth Johnson about Covid symptoms and the use of bamlanivimab, a monoclonal antibody infusion for those with mild to moderate Covid symptoms and at a high risk of hospitalization.     Pt is qualified for this infusion at the [facility] infusion center due to co-morbid conditions and/or a member of an at-risk group, however declines infusion at this time. Symptoms tier reviewed as well as criteria for ending isolation.  Symptoms reviewed that would warrant ED/Hospital evaluation. Preventative practices reviewed. Patient verbalized understanding. Patient advised to call back if he decides that he does want to get infusion. Callback number to the infusion center given. Patient advised to go to Urgent care or ED with severe symptoms. Last date she} would be eligible for infusion is 02/11/19.   Patient Active Problem List   Diagnosis Date Noted  . Trigger thumb of right hand 12/12/2017  . Post-traumatic osteoarthritis of right wrist 12/12/2017  . Biceps tendon rupture 09/21/2015  . H/O hysterectomy for benign disease 09/02/2014  . Acquired bilateral hammer toes 09/02/2014  . External hemorrhoid 09/02/2014  . Allergic rhinitis 07/19/2014  . Carpal tunnel syndrome on right 07/19/2014  . Insomnia, persistent 07/19/2014  . Chronic LBP 07/19/2014  . Mild major depression ([place]) 07/19/2014  . Dyslipidemia 07/19/2014  . Hemorrhoid 07/19/2014  . Dysmetabolic syndrome 0000000  . 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 Osteopenia 07/19/2014  . Benign essential HTN 02/04/2009  . H/O acute myocardial infarction of lateral wall 07/18/2004    Beckey Rutter, DNP, AGNP-C [facility]  Crecencio [email or web address] [facility] Main Line: [phone]

## 2019-03-02 ENCOUNTER — Other Ambulatory Visit: Payer: Self-pay | Admitting: Family Medicine

## 2019-03-02 DIAGNOSIS — Z1231 Encounter for screening mammogram for malignant neoplasm of breast: Secondary | ICD-10-CM

## 2019-03-31 ENCOUNTER — Other Ambulatory Visit: Payer: Self-pay | Admitting: Family Medicine

## 2019-03-31 ENCOUNTER — Telehealth: Payer: Self-pay

## 2019-03-31 DIAGNOSIS — F320 Major depressive disorder, single episode, mild: Secondary | ICD-10-CM

## 2019-03-31 DIAGNOSIS — M545 Low back pain, unspecified: Secondary | ICD-10-CM

## 2019-03-31 DIAGNOSIS — I10 Essential (primary) hypertension: Secondary | ICD-10-CM

## 2019-03-31 DIAGNOSIS — E785 Hyperlipidemia, unspecified: Secondary | ICD-10-CM

## 2019-03-31 DIAGNOSIS — G4709 Other insomnia: Secondary | ICD-10-CM

## 2019-03-31 NOTE — Telephone Encounter (Signed)
Hypertension medication request: Carvedilol, Lisinopril   Last office visit pertaining to hypertension: 08/20/2018  BP Readings from Last 3 Encounters:  12/12/17 122/76  09/10/17 114/64  04/15/17 (!) 152/98    Lab Results  Component Value Date   CREATININE 0.91 09/10/2017   BUN 16 09/10/2017   NA 138 09/10/2017   K 4.5 09/10/2017   CL 101 09/10/2017   CO2 25 09/10/2017   Refill Request for Cholesterol medication. Rosuvastatin    Lab Results  Component Value Date   CHOL 255 (H) 09/10/2017   HDL 51 09/10/2017   LDLCALC 164 (H) 09/10/2017   TRIG 238 (H) 09/10/2017   CHOLHDL 5.0 (H) 09/10/2017      Follow up on 04/28/2019

## 2019-03-31 NOTE — Telephone Encounter (Signed)
Medication Refill - Medication: buPROPion (WELLBUTRIN XL) 300 MG 24 hr tablet ALPRAZolam (XANAX) 0.5 MG tablet cyclobenzaprine (FLEXERIL) 10 MG tablet carvedilol (COREG) 3.125 MG tablet lisinopril (ZESTRIL) 10 MG tablet rosuvastatin (CRESTOR) 20 MG tablet  Has the patient contacted their pharmacy? Yes - starting to use mail order pharmacy and all medications should go as a 90 day supply (Agent: If no, request that the patient contact the pharmacy for the refill.) (Agent: If yes, when and what did the pharmacy advise?)  Preferred Pharmacy (with phone number or street name):  [facility] [place], [place] Phone:  [phone]  Fax:  [phone]     Agent: Please be advised that RX refills may take up to 3 business days. We ask that you follow-up with your pharmacy.

## 2019-04-01 NOTE — Telephone Encounter (Signed)
Called pt. Back, and she states she talked to her insurance, [facility], and they told her they were mailing her her medications. She will call back if she has further problems. Will keep her appointment in April.

## 2019-04-01 NOTE — Telephone Encounter (Signed)
Called and left a vm to see if the patient had enough medications until her appt in April 2021?

## 2019-04-02 NOTE — Telephone Encounter (Signed)
Pt called in to update the provider. Mail order will be faxing over a request for refill for all of her medications.. pt says that she misinformed the nurse yesterday and would like to update /correct herself. Pt would like to have Rx completed.    Please assist.

## 2019-04-03 NOTE — Telephone Encounter (Signed)
Pt stated she has enough medication to last until her appt with on 04/28/19

## 2019-04-08 ENCOUNTER — Ambulatory Visit
Admission: RE | Admit: 2019-04-08 | Discharge: 2019-04-08 | Disposition: A | Discharge status: Home / Self Care | Payer: Medicare Other | Source: Ambulatory Visit | Attending: Family Medicine | Admitting: Family Medicine

## 2019-04-08 DIAGNOSIS — Z1231 Encounter for screening mammogram for malignant neoplasm of breast: Secondary | ICD-10-CM | POA: Insufficient documentation

## 2019-04-16 ENCOUNTER — Telehealth: Payer: Self-pay

## 2019-04-16 DIAGNOSIS — M545 Low back pain, unspecified: Secondary | ICD-10-CM

## 2019-04-16 DIAGNOSIS — E785 Hyperlipidemia, unspecified: Secondary | ICD-10-CM

## 2019-04-16 DIAGNOSIS — G4709 Other insomnia: Secondary | ICD-10-CM

## 2019-04-16 DIAGNOSIS — F320 Major depressive disorder, single episode, mild: Secondary | ICD-10-CM

## 2019-04-16 DIAGNOSIS — I10 Essential (primary) hypertension: Secondary | ICD-10-CM

## 2019-04-16 NOTE — Telephone Encounter (Signed)
Copied from [place] [id number]. Topic: General - Other >> Apr 15, 2019 11:45 AM Yvette Rack wrote: Reason for CRM: Izora Gala with [facility] requests that someone from the clinical staff return their call to speak with a pharmacist. Izora Gala asked that the clinical staff be advised that the call is in reference to the following Rx: ALPRAZolam (XANAX) 0.5 MG tablet, buPROPion (WELLBUTRIN XL) 300 MG 24 hr tablet, carvedilol (COREG) 3.125 MG tablet, cyclobenzaprine (FLEXERIL) 10 MG tablet, lisinopril (ZESTRIL) 10 MG tablet, and rosuvastatin (CRESTOR) 20 MG tablet   Called Pharmacy. She states that the patient is new to them and she is requesting that the medications above be sent to them.

## 2019-04-17 MED ORDER — BUPROPION HCL ER (XL) 300 MG PO TB24: 300.0000 mg | ORAL_TABLET | Freq: Every day | ORAL | 0 refills | Status: DC

## 2019-04-17 MED ORDER — LISINOPRIL 10 MG PO TABS: 10.0000 mg | ORAL_TABLET | Freq: Every day | ORAL | 0 refills | Status: DC

## 2019-04-17 MED ORDER — ROSUVASTATIN CALCIUM 20 MG PO TABS: 20.0000 mg | ORAL_TABLET | Freq: Every day | ORAL | 0 refills | Status: DC

## 2019-04-17 MED ORDER — CARVEDILOL 3.125 MG PO TABS: 3.1250 mg | ORAL_TABLET | Freq: Two times a day (BID) | ORAL | 0 refills | Status: DC

## 2019-04-17 NOTE — Telephone Encounter (Signed)
She has changed pharmacies, requesting new rx.

## 2019-04-20 NOTE — Telephone Encounter (Signed)
Please close chart

## 2019-04-22 NOTE — Telephone Encounter (Signed)
completed

## 2019-04-28 ENCOUNTER — Ambulatory Visit (INDEPENDENT_AMBULATORY_CARE_PROVIDER_SITE_OTHER): Payer: Medicare Other | Admitting: Family Medicine

## 2019-04-28 ENCOUNTER — Encounter: Payer: Self-pay | Admitting: Family Medicine

## 2019-04-28 VITALS — Temp 96.2°F | Ht 61.5 in | Wt 141.0 lb

## 2019-04-28 DIAGNOSIS — E785 Hyperlipidemia, unspecified: Secondary | ICD-10-CM

## 2019-04-28 NOTE — Progress Notes (Signed)
It was cancelled

## 2019-04-29 ENCOUNTER — Encounter: Payer: Self-pay | Admitting: Family Medicine

## 2019-04-29 ENCOUNTER — Ambulatory Visit (INDEPENDENT_AMBULATORY_CARE_PROVIDER_SITE_OTHER): Payer: Medicare Other | Admitting: Family Medicine

## 2019-04-29 ENCOUNTER — Other Ambulatory Visit: Payer: Self-pay

## 2019-04-29 DIAGNOSIS — I10 Essential (primary) hypertension: Secondary | ICD-10-CM

## 2019-04-29 DIAGNOSIS — M545 Low back pain, unspecified: Secondary | ICD-10-CM

## 2019-04-29 DIAGNOSIS — E785 Hyperlipidemia, unspecified: Secondary | ICD-10-CM

## 2019-04-29 DIAGNOSIS — I209 Angina pectoris, unspecified: Secondary | ICD-10-CM

## 2019-04-29 DIAGNOSIS — G4709 Other insomnia: Secondary | ICD-10-CM

## 2019-04-29 DIAGNOSIS — F320 Major depressive disorder, single episode, mild: Secondary | ICD-10-CM | POA: Diagnosis not present

## 2019-04-29 MED ORDER — CYCLOBENZAPRINE HCL 10 MG PO TABS: 5.0000 mg | ORAL_TABLET | Freq: Every day | ORAL | 0 refills | Status: DC

## 2019-04-29 MED ORDER — ALPRAZOLAM 0.5 MG PO TABS: 0.5000 mg | ORAL_TABLET | Freq: Every day | ORAL | 0 refills | Status: DC | PRN

## 2019-04-29 MED ORDER — BUPROPION HCL ER (XL) 300 MG PO TB24: 300.0000 mg | ORAL_TABLET | Freq: Every day | ORAL | 0 refills | Status: DC

## 2019-04-29 MED ORDER — LISINOPRIL 10 MG PO TABS: 10.0000 mg | ORAL_TABLET | Freq: Every day | ORAL | 0 refills | Status: DC

## 2019-04-29 MED ORDER — ROSUVASTATIN CALCIUM 20 MG PO TABS: 20.0000 mg | ORAL_TABLET | Freq: Every day | ORAL | 0 refills | Status: DC

## 2019-04-29 MED ORDER — CARVEDILOL 3.125 MG PO TABS: 3.1250 mg | ORAL_TABLET | Freq: Two times a day (BID) | ORAL | 0 refills | Status: DC

## 2019-04-29 NOTE — Progress Notes (Signed)
Name: Elizabeth Johnson   MRN: [id number]    DOB: 03-31-1950   Date:04/29/2019       Progress Note  Subjective  Chief Complaint  Chief Complaint  Patient presents with  . Medication Refill    I connected with  [name] on 04/29/19 at  8:00 AM EDT by telephone and verified that I am speaking with the correct person using two identifiers.  I discussed the limitations, risks, security and privacy concerns of performing an evaluation and management service by telephone and the availability of in person appointments. Staff also discussed with the patient that there may be a patient responsible charge related to this service. Patient Location: at home  Provider Location: [facility]   HPI  Hyperlipidemia: shewas on Pravastatin and we changed to Crestor, and she has noticed myalgia again, we will decrease dose to 20 mg, she will return for labs next week. Discussed CoQ10 supplementation   HTN: patient is doing well, no chest pain , palpitation or SOB.Dizziness has resolved.   Major Depression:finally adjusting on not working, forced to retired back in 2017. Her insurance denied coverage of Wellbutrin XL 300 mg but she has a new plan and would like to have a refill today . She is worried about her dogs. Phq 9 negative   History of COVID-19: she was diagnosed on Feb 2021. She still has intermittent change in taste and still has occasional headaches. Otherwise other symptoms are gone   Back pain: she has mild low back pain, Flexeril qhs prn only, stable. Described as spasms, no radiculitis, denies pain at this time.   Insomnia: she takes alprazolam very seldom and needs a refill, she has not been sleeping well because  worried about her dogs. One is very old and the other one is collapsing. She is not sleeping well taking care of them   Angina pectoris: history of MI, denies recent chest pain, has NTG at home, on beta-blocker, statin therapy and ACE, also takes aspirin. Not seeing  cardiologist at this time. We will recheck lipid panel next week, she states she will come back to get it done  Hyperglycemia: denies polyphagia, polydipsia or polyuria, she needs to come in for labs.    Patient Active Problem List   Diagnosis Date Noted  . Trigger thumb of right hand 12/12/2017  . Post-traumatic osteoarthritis of right wrist 12/12/2017  . Biceps tendon rupture 09/21/2015  . H/O hysterectomy for benign disease 09/02/2014  . Acquired bilateral hammer toes 09/02/2014  . External hemorrhoid 09/02/2014  . Allergic rhinitis 07/19/2014  . Carpal tunnel syndrome on right 07/19/2014  . Insomnia, persistent 07/19/2014  . Chronic LBP 07/19/2014  . Mild major depression ([place]) 07/19/2014  . Dyslipidemia 07/19/2014  . Hemorrhoid 07/19/2014  . Dysmetabolic syndrome 55/37/4827  . 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 Osteopenia 07/19/2014  . Benign essential HTN 02/04/2009  . H/O acute myocardial infarction of lateral wall 07/18/2004    Past Surgical History:  Procedure Laterality Date  . ABDOMINAL HYSTERECTOMY    . BUNIONECTOMY Bilateral 1973  . CARPAL TUNNEL RELEASE Right 04/15/2017   Procedure: CARPAL TUNNEL RELEASE;  Surgeon: Earnestine Leys, MD;  Location: [facility] ORS;  Service: Orthopedics;  Laterality: Right;  . COLONOSCOPY WITH PROPOFOL N/A 11/23/2016   Procedure: COLONOSCOPY WITH PROPOFOL;  Surgeon: [name], MD;  Location: [facility] ENDOSCOPY;  Service: Gastroenterology;  Laterality: N/A;  . CORONARY STENT PLACEMENT  2009  . ELBOW SURGERY Left 1977  .  HAMMER TOE SURGERY Right 1992  . RIB FRACTURE SURGERY    . WRIST FRACTURE SURGERY Right 1977    Family History  Problem Relation Age of Onset  . Alzheimer's disease Mother   . Breast cancer Mother 25  . CAD Father   . Heart attack Father   . Heart failure Father   . COPD Father   . Drug abuse Sister   . Suicidality Sister   . Heart attack Brother     Social History    Socioeconomic History  . Marital status: Married    Spouse name: Not on file  . Number of children: 1  . Years of education: Not on file  . Highest education level: Not on file  Occupational History  . Occupation: retired   Tobacco Use  . Smoking status: Former Smoker    Years: 8.00    Types: Cigarettes    Start date: 01/23/1999    Quit date: 05/23/2007    Years since quitting: 11.9  . Smokeless tobacco: Never Used  Substance and Sexual Activity  . Alcohol use: Yes    Alcohol/week: 0.0 standard drinks    Comment: occasional  . Drug use: No  . Sexual activity: Yes    Partners: Male  Other Topics Concern  . Not on file  Social History Narrative   She worked for the same company for 20 years and they closed [place] branch and she was forced to retire Fall 2017. When her position was moved to [facility]    Married    Social Determinants of Health   Financial Resource Strain:   . Difficulty of Paying Living Expenses:   Food Insecurity:   . Worried About Charity fundraiser in the Last Year:   . Arboriculturist in the Last Year:   Transportation Needs:   . Film/video editor (Medical):   Marland Kitchen Lack of Transportation (Non-Medical):   Physical Activity:   . Days of Exercise per Week:   . Minutes of Exercise per Session:   Stress:   . Feeling of Stress :   Social Connections:   . Frequency of Communication with Friends and Family:   . Frequency of Social Gatherings with Friends and Family:   . Attends Religious Services:   . Active Member of Clubs or Organizations:   . Attends Archivist Meetings:   Marland Kitchen Marital Status:   Intimate Partner Violence:   . Fear of Current or Ex-Partner:   . Emotionally Abused:   Marland Kitchen Physically Abused:   . Sexually Abused:      Current Outpatient Medications:  .  acetaminophen (TYLENOL) 500 MG tablet, Take 1 tablet (500 mg total) by mouth every 6 (six) hours., Disp: 360 tablet, Rfl: 1 .  ALPRAZolam (XANAX) 0.5 MG tablet, Take 1 tablet (0.5  mg total) by mouth daily as needed for anxiety., Disp: 30 tablet, Rfl: 0 .  aspirin 81 MG chewable tablet, Chew 81 mg by mouth daily. , Disp: , Rfl:  .  Black Cohosh 540 MG CAPS, Take 540 mg by mouth daily., Disp: , Rfl:  .  Blood Glucose Calibration (ACCU-CHEK SMARTVIEW CONTROL) LIQD, 1 each by In Vitro route daily., Disp: 200 each, Rfl: 1 .  Blood Glucose Monitoring Suppl (ACCU-CHEK NANO SMARTVIEW) w/Device KIT, 1 kit by Does not apply route daily., Disp: 1 kit, Rfl: 0 .  buPROPion (WELLBUTRIN XL) 300 MG 24 hr tablet, Take 1 tablet (300 mg total) by mouth daily., Disp: 90  tablet, Rfl: 0 .  carvedilol (COREG) 3.125 MG tablet, Take 1 tablet (3.125 mg total) by mouth 2 (two) times daily with a meal., Disp: 180 tablet, Rfl: 0 .  cyclobenzaprine (FLEXERIL) 10 MG tablet, Take 0.5-1 tablets (5-10 mg total) by mouth at bedtime., Disp: 90 tablet, Rfl: 1 .  Lancets Misc. (ACCU-CHEK SOFTCLIX LANCET DEV) KIT, , Disp: , Rfl:  .  lisinopril (ZESTRIL) 10 MG tablet, Take 1 tablet (10 mg total) by mouth daily., Disp: 90 tablet, Rfl: 0 .  nitroGLYCERIN (NITROSTAT) 0.4 MG SL tablet, Place 1 tablet (0.4 mg total) under the tongue every 5 (five) minutes as needed for chest pain., Disp: 10 tablet, Rfl: 2 .  Omega-3 Fatty Acids (FISH OIL PO), Take 1 capsule by mouth daily., Disp: , Rfl:  .  rosuvastatin (CRESTOR) 20 MG tablet, Take 1 tablet (20 mg total) by mouth daily., Disp: 90 tablet, Rfl: 0  Allergies  Allergen Reactions  . Codeine Other (See Comments)    Sensitivity. Nauseated   . Crestor [Rosuvastatin Calcium] Other (See Comments)    Muscle cramp    I personally reviewed active problem list, medication list, allergies, family history, social history, health maintenance with the patient/caregiver today.   ROS  Ten systems reviewed and is negative except as mentioned in HPI   Objective  Virtual encounter, vitals not obtained.  There is no height or weight on file to calculate BMI.  Physical  Exam  Awake, alert and oriented  PHQ2/9: Depression screen [facility] 2/9 04/29/2019 04/28/2019 05/01/2018 09/10/2017 01/07/2017  Decreased Interest 0 0 0 0 2  Down, Depressed, Hopeless 0 0 2 0 2  PHQ - 2 Score 0 0 2 0 4  Altered sleeping 0 0 0 2 2  Tired, decreased energy 0 0 '2 2 3  ' Change in appetite 0 0 0 0 0  Feeling bad or failure about yourself  0 0 0 1 2  Trouble concentrating 0 0 - 0 0  Moving slowly or fidgety/restless 0 0 0 0 0  Suicidal thoughts 0 0 0 0 0  PHQ-9 Score 0 0 '4 5 11  ' Difficult doing work/chores - Not difficult at all Not difficult at all Not difficult at all Somewhat difficult   PHQ-2/9 Result is negative.    Fall Risk: Fall Risk  04/29/2019 04/28/2019 12/12/2017 09/10/2017 01/07/2017  Falls in the past year? 0 0 0 No No  Number falls in past yr: 0 0 0 - -  Injury with Fall? 0 0 0 - -     Assessment & Plan  1. Dyslipidemia  - rosuvastatin (CRESTOR) 20 MG tablet; Take 1 tablet (20 mg total) by mouth daily.  Dispense: 90 tablet; Refill: 0  2. Benign essential HTN  - lisinopril (ZESTRIL) 10 MG tablet; Take 1 tablet (10 mg total) by mouth daily.  Dispense: 90 tablet; Refill: 0 - carvedilol (COREG) 3.125 MG tablet; Take 1 tablet (3.125 mg total) by mouth 2 (two) times daily with a meal.  Dispense: 180 tablet; Refill: 0  3. Intermittent low back pain  - cyclobenzaprine (FLEXERIL) 10 MG tablet; Take 0.5-1 tablets (5-10 mg total) by mouth at bedtime.  Dispense: 90 tablet; Refill: 0  4. Mild major depression (HCC)  - buPROPion (WELLBUTRIN XL) 300 MG 24 hr tablet; Take 1 tablet (300 mg total) by mouth daily.  Dispense: 90 tablet; Refill: 0  5. Other insomnia  - ALPRAZolam (XANAX) 0.5 MG tablet; Take 1 tablet (0.5 mg total) by mouth daily  as needed for anxiety.  Dispense: 30 tablet; Refill: 0  6. Angina pectoris syndrome ([place])   I discussed the assessment and treatment plan with the patient. The patient was provided an opportunity to ask questions and all were  answered. The patient agreed with the plan and demonstrated an understanding of the instructions.   The patient was advised to call back or seek an in-person evaluation if the symptoms worsen or if the condition fails to improve as anticipated.  I provided 25  minutes of non-face-to-face time during this encounter.  Loistine Chance, MD

## 2019-05-01 ENCOUNTER — Telehealth: Payer: Self-pay | Admitting: Family Medicine

## 2019-05-01 NOTE — Telephone Encounter (Signed)
Pt called and was told to call with BP readings.    05/01/2019 Morning 120/66 Heart rate: 104

## 2019-05-06 LAB — COMPLETE METABOLIC PANEL WITH GFR
AG Ratio: 1.7 (calc) (ref 1.0–2.5)
ALT: 17 U/L (ref 6–29)
AST: 20 U/L (ref 10–35)
Albumin: 4.5 g/dL (ref 3.6–5.1)
Alkaline phosphatase (APISO): 67 U/L (ref 37–153)
BUN: 13 mg/dL (ref 7–25)
CO2: 28 mmol/L (ref 20–32)
Calcium: 10.3 mg/dL (ref 8.6–10.4)
Chloride: 105 mmol/L (ref 98–110)
Creat: 0.83 mg/dL (ref 0.50–0.99)
GFR, Est African American: 85 mL/min/{1.73_m2} (ref >=60)
GFR, Est Non African American: 73 mL/min/{1.73_m2} (ref >=60)
Globulin: 2.7 g/dL (calc) (ref 1.9–3.7)
Glucose, Bld: 130 mg/dL — ABNORMAL HIGH (ref 65–99)
Potassium: 5.1 mmol/L (ref 3.5–5.3)
Sodium: 140 mmol/L (ref 135–146)
Total Bilirubin: 0.4 mg/dL (ref 0.2–1.2)
Total Protein: 7.2 g/dL (ref 6.1–8.1)

## 2019-05-06 LAB — CBC WITH DIFFERENTIAL/PLATELET
Absolute Monocytes: 642 cells/uL (ref 200–950)
Basophils Absolute: 62 cells/uL (ref 0–200)
Basophils Relative: 0.9 %
Eosinophils Absolute: 207 cells/uL (ref 15–500)
Eosinophils Relative: 3 %
HCT: 44.5 % (ref 35.0–45.0)
Hemoglobin: 14.4 g/dL (ref 11.7–15.5)
Lymphs Abs: 2042 cells/uL (ref 850–3900)
MCH: 28.9 pg (ref 27.0–33.0)
MCHC: 32.4 g/dL (ref 32.0–36.0)
MCV: 89.2 fL (ref 80.0–100.0)
MPV: 8.9 fL (ref 7.5–12.5)
Monocytes Relative: 9.3 %
Neutro Abs: 3947 cells/uL (ref 1500–7800)
Neutrophils Relative %: 57.2 %
Platelets: 285 10*3/uL (ref 140–400)
RBC: 4.99 10*6/uL (ref 3.80–5.10)
RDW: 13.6 % (ref 11.0–15.0)
Total Lymphocyte: 29.6 %
WBC: 6.9 10*3/uL (ref 3.8–10.8)

## 2019-05-06 LAB — HEMOGLOBIN A1C
Hgb A1c MFr Bld: 5.8 % of total Hgb — ABNORMAL HIGH (ref <5.7)
Mean Plasma Glucose: 120 (calc)
eAG (mmol/L): 6.6 (calc)

## 2019-05-06 LAB — LIPID PANEL
Cholesterol: 188 mg/dL (ref <200)
HDL: 66 mg/dL (ref >=50)
LDL Cholesterol (Calc): 95 mg/dL (calc)
Non-HDL Cholesterol (Calc): 122 mg/dL (calc) (ref <130)
Total CHOL/HDL Ratio: 2.8 (calc) (ref <5.0)
Triglycerides: 173 mg/dL — ABNORMAL HIGH (ref <150)

## 2019-05-06 LAB — VITAMIN B12: Vitamin B-12: 390 pg/mL (ref 200–1100)

## 2019-05-10 ENCOUNTER — Other Ambulatory Visit: Payer: Self-pay | Admitting: Family Medicine

## 2019-05-20 ENCOUNTER — Other Ambulatory Visit: Payer: Self-pay | Admitting: Family Medicine

## 2019-05-20 DIAGNOSIS — I10 Essential (primary) hypertension: Secondary | ICD-10-CM

## 2019-05-20 DIAGNOSIS — F320 Major depressive disorder, single episode, mild: Secondary | ICD-10-CM

## 2019-05-20 DIAGNOSIS — E785 Hyperlipidemia, unspecified: Secondary | ICD-10-CM

## 2019-06-05 ENCOUNTER — Other Ambulatory Visit: Payer: Self-pay | Admitting: Family Medicine

## 2019-06-05 DIAGNOSIS — F320 Major depressive disorder, single episode, mild: Secondary | ICD-10-CM

## 2019-06-05 DIAGNOSIS — E785 Hyperlipidemia, unspecified: Secondary | ICD-10-CM

## 2019-06-05 DIAGNOSIS — I10 Essential (primary) hypertension: Secondary | ICD-10-CM

## 2019-07-14 ENCOUNTER — Ambulatory Visit (INDEPENDENT_AMBULATORY_CARE_PROVIDER_SITE_OTHER): Payer: Medicare Other

## 2019-07-14 VITALS — Temp 97.2°F | Ht 62.0 in | Wt 141.0 lb

## 2019-07-14 DIAGNOSIS — Z780 Asymptomatic menopausal state: Secondary | ICD-10-CM | POA: Diagnosis not present

## 2019-07-14 DIAGNOSIS — Z0000 Encounter for general adult medical examination without abnormal findings: Secondary | ICD-10-CM | POA: Diagnosis not present

## 2019-07-14 NOTE — Progress Notes (Signed)
Subjective:   Elizabeth Johnson is a 69 y.o. female who presents for an Initial Medicare Annual Wellness Visit.  Virtual Visit via Telephone Note  I connected with  [name] on 07/14/19 at  9:20 AM EDT by telephone and verified that I am speaking with the correct person using two identifiers.  Medicare Annual Wellness visit completed telephonically due to Covid-19 pandemic.   Location: Patient: home Provider: office   I discussed the limitations, risks, security and privacy concerns of performing an evaluation and management service by telephone and the availability of in person appointments. The patient expressed understanding and agreed to proceed.  Unable to perform video visit due to video visit attempted and failed and/or patient does not have video capability.   Some vital signs may be absent or patient reported.   Clemetine Marker, LPN    Review of Systems     Cardiac Risk Factors include: advanced age (>33mn, >>72women);dyslipidemia;hypertension     Objective:    Today's Vitals   07/14/19 0930  Temp: (!) 97.2 F (36.2 C)  Weight: 141 lb (64 kg)  Height: '5\' 2"'  (1.575 m)   Body mass index is 25.79 kg/m.  Advanced Directives 07/14/2019 04/09/2017 11/23/2016 10/16/2016 05/21/2016 09/21/2015 01/06/2015  Does Patient Have a Medical Advance Directive? No No No No No No No  Would patient like information on creating a medical advance directive? Yes (MAU/Ambulatory/Procedural Areas - Information given) Yes (MAU/Ambulatory/Procedural Areas - Information given) - - - No - patient declined information No - patient declined information    Current Medications (verified) Outpatient Encounter Medications as of 07/14/2019  Medication Sig  . acetaminophen (TYLENOL) 500 MG tablet Take 1 tablet (500 mg total) by mouth every 6 (six) hours.  . ALPRAZolam (XANAX) 0.5 MG tablet Take 1 tablet (0.5 mg total) by mouth daily as needed for anxiety.  .Marland Kitchenaspirin 81 MG chewable tablet Chew 81  mg by mouth daily.   .Marland KitchenbuPROPion (WELLBUTRIN XL) 300 MG 24 hr tablet TAKE 1 TABLET BY MOUTH  DAILY  . carvedilol (COREG) 3.125 MG tablet TAKE 1 TABLET BY MOUTH  TWICE DAILY WITH A MEAL  . cholecalciferol (VITAMIN D3) 25 MCG (1000 UNIT) tablet Take 1,000 Units by mouth daily.  . cyclobenzaprine (FLEXERIL) 10 MG tablet Take 0.5-1 tablets (5-10 mg total) by mouth at bedtime.  .Marland Kitchenlisinopril (ZESTRIL) 10 MG tablet TAKE 1 TABLET BY MOUTH  DAILY  . rosuvastatin (CRESTOR) 20 MG tablet TAKE 1 TABLET BY MOUTH  DAILY  . nitroGLYCERIN (NITROSTAT) 0.4 MG SL tablet Place 1 tablet (0.4 mg total) under the tongue every 5 (five) minutes as needed for chest pain.  . [DISCONTINUED] Black Cohosh 540 MG CAPS Take 540 mg by mouth daily.  . [DISCONTINUED] Blood Glucose Calibration (ACCU-CHEK SMARTVIEW CONTROL) LIQD 1 each by In Vitro route daily.  . [DISCONTINUED] Blood Glucose Monitoring Suppl (ACCU-CHEK NANO SMARTVIEW) w/Device KIT 1 kit by Does not apply route daily.  . [DISCONTINUED] Lancets Misc. (ACCU-CHEK SOFTCLIX LANCET DEV) KIT   . [DISCONTINUED] Omega-3 Fatty Acids (FISH OIL PO) Take 1 capsule by mouth daily.   No facility-administered encounter medications on file as of 07/14/2019.    Allergies (verified) Codeine   History: Past Medical History:  Diagnosis Date  . Anxiety   . Arthritis    hands  . Coronary artery disease   . Depression   . Hyperlipidemia   . Hypertension   . Myocardial infarction ([facility] 2009   has not  seen cardiology in 9 years.  coronary stent placed at this time   Past Surgical History:  Procedure Laterality Date  . ABDOMINAL HYSTERECTOMY    . BUNIONECTOMY Bilateral 1973  . CARPAL TUNNEL RELEASE Right 04/15/2017   Procedure: CARPAL TUNNEL RELEASE;  Surgeon: Earnestine Leys, MD;  Location: [facility] ORS;  Service: Orthopedics;  Laterality: Right;  . CATARACT EXTRACTION, BILATERAL  05/21 & 06/21  . COLONOSCOPY WITH PROPOFOL N/A 11/23/2016   Procedure: COLONOSCOPY WITH PROPOFOL;   Surgeon: [name], MD;  Location: [facility] ENDOSCOPY;  Service: Gastroenterology;  Laterality: N/A;  . CORONARY STENT PLACEMENT  2009  . ELBOW SURGERY Left 1977  . HAMMER TOE SURGERY Right 1992  . RIB FRACTURE SURGERY    . WRIST FRACTURE SURGERY Right 1977   Family History  Problem Relation Age of Onset  . Alzheimer's disease Mother   . Breast cancer Mother 42  . CAD Father   . Heart attack Father   . Heart failure Father   . COPD Father   . Drug abuse Sister   . Suicidality Sister   . Heart attack Brother    Social History   Socioeconomic History  . Marital status: Significant Other    Spouse name: Not on file  . Number of children: 1  . Years of education: Not on file  . Highest education level: Not on file  Occupational History  . Occupation: retired   Tobacco Use  . Smoking status: Former Smoker    Years: 8.00    Types: Cigarettes    Start date: 01/23/1999    Quit date: 05/23/2007    Years since quitting: 12.1  . Smokeless tobacco: Never Used  Vaping Use  . Vaping Use: Never used  Substance and Sexual Activity  . Alcohol use: Yes    Alcohol/week: 0.0 standard drinks    Comment: occasional  . Drug use: No  . Sexual activity: Yes    Partners: Male  Other Topics Concern  . Not on file  Social History Narrative   She worked for the same company for 20 years and they closed [place] branch and she was forced to retire Fall 2017. When her position was moved to [facility] with fiance of 25 years.    Social Determinants of Health   Financial Resource Strain: Low Risk   . Difficulty of Paying Living Expenses: Not hard at all  Food Insecurity: No Food Insecurity  . Worried About Charity fundraiser in the Last Year: Never true  . Ran Out of Food in the Last Year: Never true  Transportation Needs: No Transportation Needs  . Lack of Transportation (Medical): No  . Lack of Transportation (Non-Medical): No  Physical Activity: Inactive  . Days of Exercise per Week: 0  days  . Minutes of Exercise per Session: 0 min  Stress: No Stress Concern Present  . Feeling of Stress : Not at all  Social Connections: Moderately Isolated  . Frequency of Communication with Friends and Family: More than three times a week  . Frequency of Social Gatherings with Friends and Family: More than three times a week  . Attends Religious Services: Never  . Active Member of Clubs or Organizations: No  . Attends Archivist Meetings: Never  . Marital Status: Living with partner    Tobacco Counseling Counseling given: Not Answered   Clinical Intake:  Pre-visit preparation completed: Yes  Pain : No/denies pain     BMI - recorded:  25.79 Nutritional Status: BMI 25 -29 Overweight Nutritional Risks: None Diabetes: No  How often do you need to have someone help you when you read instructions, pamphlets, or other written materials from your doctor or pharmacy?: 1 - Never   Interpreter Needed?: No  Information entered by :: Clemetine Marker LPN   Activities of Daily Living In your present state of health, do you have any difficulty performing the following activities: 07/14/2019 04/29/2019  Hearing? N N  Comment declines hearing aids -  Vision? N N  Difficulty concentrating or making decisions? N N  Walking or climbing stairs? N N  Dressing or bathing? N N  Doing errands, shopping? N N  Preparing Food and eating ? N -  Using the Toilet? N -  In the past six months, have you accidently leaked urine? N -  Do you have problems with loss of bowel control? N -  Managing your Medications? N -  Managing your Finances? N -  Housekeeping or managing your Housekeeping? N -  Some recent data might be hidden    Patient Care Team: Steele Sizer, MD as PCP - General (Family Medicine)  Indicate any recent Medical Services you may have received from other than [facility] providers in the past year (date may be approximate).     Assessment:   This is a routine wellness  examination for Kelvin.  Hearing/Vision screen  Hearing Screening   '125Hz'  '250Hz'  '500Hz'  '1000Hz'  '2000Hz'  '3000Hz'  '4000Hz'  '6000Hz'  '8000Hz'   Right ear:           Left ear:           Comments: Pt denies hearing difficulty  Vision Screening Comments: Annual vision screenings done with [facility]. Dr. Valma Cava  Dietary issues and exercise activities discussed: Current Exercise Habits: The patient does not participate in regular exercise at present, Exercise limited by: None identified  Goals    . Increase physical activity     Recommend increasing exercise and walking to at least 3 days per week      Depression Screen PHQ 2/9 Scores 07/14/2019 04/29/2019 04/28/2019 05/01/2018 09/10/2017 01/07/2017 10/16/2016  PHQ - 2 Score 0 0 0 2 0 4 2  PHQ- 9 Score - 0 0 '4 5 11 10    ' Fall Risk Fall Risk  07/14/2019 04/29/2019 04/28/2019 12/12/2017 09/10/2017  Falls in the past year? 1 0 0 0 No  Number falls in past yr: 0 0 0 0 -  Injury with Fall? 0 0 0 0 -  Risk for fall due to : No Fall Risks - - - -  Follow up Falls prevention discussed - - - -    Any stairs in or around the home? Yes  If so, are there any without handrails? Yes  Home free of loose throw rugs in walkways, pet beds, electrical cords, etc? Yes  Adequate lighting in your home to reduce risk of falls? Yes   ASSISTIVE DEVICES UTILIZED TO PREVENT FALLS:  Life alert? No  Use of a cane, walker or w/c? No  Grab bars in the bathroom? No  Shower chair or bench in shower? Yes  Elevated toilet seat or a handicapped toilet? No   TIMED UP AND GO:  Was the test performed? No . Telephonic visit  Cognitive Function:     6CIT Screen 07/14/2019  What Year? 0 points  What month? 0 points  What time? 0 points  Count back from 20 0 points  Months in reverse 0 points  Repeat phrase 0 points  Total Score 0    Immunizations Immunization History  Administered Date(s) Administered  . Influenza Split 10/15/2011  . Influenza, High Dose Seasonal PF 10/16/2016   . Influenza, Quadrivalent, Recombinant, Inj, Pf 10/30/2017  . Influenza,inj,Quad PF,6+ Mos 09/14/2013, 01/06/2015, 09/21/2015  . Pneumococcal Conjugate-13 12/04/2017  . Pneumococcal Polysaccharide-23 09/25/2007, 10/16/2016  . Tdap 09/25/2007  . Zoster 05/19/2012    TDAP status: Due, Education has been provided regarding the importance of this vaccine. Advised may receive this vaccine at local pharmacy or Health Dept. Aware to provide a copy of the vaccination record if obtained from local pharmacy or Health Dept. Verbalized acceptance and understanding.   Flu Vaccine status: Up to date per patient, will provide vaccine date from pharmacy.   Pneumococcal vaccine status: Up to date   Covid-19 vaccine status: Declined, Education has been provided regarding the importance of this vaccine but patient still declined. Advised may receive this vaccine at local pharmacy or Health Dept.or vaccine clinic. Aware to provide a copy of the vaccination record if obtained from local pharmacy or Health Dept. Verbalized acceptance and understanding.  Qualifies for Shingles Vaccine? Yes   Zostavax completed Yes   Shingrix Completed?: No.    Education has been provided regarding the importance of this vaccine. Patient has been advised to call insurance company to determine out of pocket expense if they have not yet received this vaccine. Advised may also receive vaccine at local pharmacy or Health Dept. Verbalized acceptance and understanding.  Screening Tests Health Maintenance  Topic Date Due  . COVID-19 Vaccine (1) Never done  . TETANUS/TDAP  04/28/2020 (Originally 09/24/2017)  . INFLUENZA VACCINE  08/23/2019  . MAMMOGRAM  04/07/2021  . COLONOSCOPY  11/23/2021  . DEXA SCAN  Completed  . Hepatitis C Screening  Completed  . PNA vac Low Risk Adult  Completed    Health Maintenance  Health Maintenance Due  Topic Date Due  . COVID-19 Vaccine (1) Never done    Colorectal cancer screening: Completed  11/23/16. Repeat every 5 years   Mammogram status: Completed 04/08/19. Repeat every year   Bone Density status: Completed 01/02/17. Results reflect: Bone density results: OSTEOPENIA. Repeat every 2 years. Ordered today.   Lung Cancer Screening: (Low Dose CT Chest recommended if Age 97-80 years, 30 pack-year currently smoking OR have quit w/in 15years.) does not qualify.   Additional Screening:  Hepatitis C Screening: does qualify; Completed 09/10/13  Vision Screening: Recommended annual ophthalmology exams for early detection of glaucoma and other disorders of the eye. Is the patient up to date with their annual eye exam?  Yes  Who is the provider or what is the name of the office in which the patient attends annual eye exams? [facility]  Dental Screening: Recommended annual dental exams for proper oral hygiene  Community Resource Referral / Chronic Care Management: CRR required this visit?  No   CCM required this visit?  No      Plan:     I have personally reviewed and noted the following in the patient's chart:   . Medical and social history . Use of alcohol, tobacco or illicit drugs  . Current medications and supplements . Functional ability and status . Nutritional status . Physical activity . Advanced directives . List of other physicians . Hospitalizations, surgeries, and ER visits in previous 12 months . Vitals . Screenings to include cognitive, depression, and falls . Referrals and appointments  In addition, I have reviewed and discussed with  patient certain preventive protocols, quality metrics, and best practice recommendations. A written personalized care plan for preventive services as well as general preventive health recommendations were provided to patient.     Clemetine Marker, LPN   08/12/7114   Nurse Notes: pt states she did not receive any information about recent lab work. Advised provider message attached to lab in [place] and pt informed to start vitamin b12  sublingual 1000 mcg qd. She is otherwise doing well and appreciative of visit today

## 2019-07-14 NOTE — Patient Instructions (Signed)
Elizabeth Johnson , Thank you for taking time to come for your Medicare Wellness Visit. I appreciate your ongoing commitment to your health goals. Please review the following plan we discussed and let me know if I can assist you in the future.   Screening recommendations/referrals: Colonoscopy: done 11/23/16. Due 2023 Mammogram: done 04/08/19 Bone Density: done 01/02/17. Please call [phone] to schedule your bone density screening.  Recommended yearly ophthalmology/optometry visit for glaucoma screening and checkup Recommended yearly dental visit for hygiene and checkup  Vaccinations: Influenza vaccine: done - please provide vaccination date from pharmacy Pneumococcal vaccine: done 12/04/17 Tdap vaccine: due Shingles vaccine: Shingrix discussed. Please contact your pharmacy for coverage information.  Covid-19:declined  Advanced directives: Advance directive discussed with you today. I have provided a copy for you to complete at home and have notarized. Once this is complete please bring a copy in to our office so we can scan it into your chart.  Conditions/risks identified: Recommend increasing physical activity to at least 3 days per week  Next appointment: Follow up in one year for your annual wellness visit    Preventive Care 65 Years and Older, Female Preventive care refers to lifestyle choices and visits with your health care provider that can promote health and wellness. What does preventive care include?  A yearly physical exam. This is also called an annual well check.  Dental exams once or twice a year.  Routine eye exams. Ask your health care provider how often you should have your eyes checked.  Personal lifestyle choices, including:  Daily care of your teeth and gums.  Regular physical activity.  Eating a healthy diet.  Avoiding tobacco and drug use.  Limiting alcohol use.  Practicing safe sex.  Taking low-dose aspirin every day.  Taking vitamin and mineral  supplements as recommended by your health care provider. What happens during an annual well check? The services and screenings done by your health care provider during your annual well check will depend on your age, overall health, lifestyle risk factors, and family history of disease. Counseling  Your health care provider may ask you questions about your:  Alcohol use.  Tobacco use.  Drug use.  Emotional well-being.  Home and relationship well-being.  Sexual activity.  Eating habits.  History of falls.  Memory and ability to understand (cognition).  Work and work Statistician.  Reproductive health. Screening  You may have the following tests or measurements:  Height, weight, and BMI.  Blood pressure.  Lipid and cholesterol levels. These may be checked every 5 years, or more frequently if you are over 9 years old.  Skin check.  Lung cancer screening. You may have this screening every year starting at age 11 if you have a 30-pack-year history of smoking and currently smoke or have quit within the past 15 years.  Fecal occult blood test (FOBT) of the stool. You may have this test every year starting at age 84.  Flexible sigmoidoscopy or colonoscopy. You may have a sigmoidoscopy every 5 years or a colonoscopy every 10 years starting at age 34.  Hepatitis C blood test.  Hepatitis B blood test.  Sexually transmitted disease (STD) testing.  Diabetes screening. This is done by checking your blood sugar (glucose) after you have not eaten for a while (fasting). You may have this done every 1-3 years.  Bone density scan. This is done to screen for osteoporosis. You may have this done starting at age 13.  Mammogram. This may be done every 1-2  years. Talk to your health care provider about how often you should have regular mammograms. Talk with your health care provider about your test results, treatment options, and if necessary, the need for more tests. Vaccines  Your  health care provider may recommend certain vaccines, such as:  Influenza vaccine. This is recommended every year.  Tetanus, diphtheria, and acellular pertussis (Tdap, Td) vaccine. You may need a Td booster every 10 years.  Zoster vaccine. You may need this after age 42.  Pneumococcal 13-valent conjugate (PCV13) vaccine. One dose is recommended after age 10.  Pneumococcal polysaccharide (PPSV23) vaccine. One dose is recommended after age 88. Talk to your health care provider about which screenings and vaccines you need and how often you need them. This information is not intended to replace advice given to you by your health care provider. Make sure you discuss any questions you have with your health care provider. Document Released: 02/04/2015 Document Revised: 09/28/2015 Document Reviewed: 11/09/2014 Elsevier Interactive Patient Education  2017 [place] Prevention in the Home Falls can cause injuries. They can happen to people of all ages. There are many things you can do to make your home safe and to help prevent falls. What can I do on the outside of my home?  Regularly fix the edges of walkways and driveways and fix any cracks.  Remove anything that might make you trip as you walk through a door, such as a raised step or threshold.  Trim any bushes or trees on the path to your home.  Use bright outdoor lighting.  Clear any walking paths of anything that might make someone trip, such as rocks or tools.  Regularly check to see if handrails are loose or broken. Make sure that both sides of any steps have handrails.  Any raised decks and porches should have guardrails on the edges.  Have any leaves, snow, or ice cleared regularly.  Use sand or salt on walking paths during winter.  Clean up any spills in your garage right away. This includes oil or grease spills. What can I do in the bathroom?  Use night lights.  Install grab bars by the toilet and in the tub and  shower. Do not use towel bars as grab bars.  Use non-skid mats or decals in the tub or shower.  If you need to sit down in the shower, use a plastic, non-slip stool.  Keep the floor dry. Clean up any water that spills on the floor as soon as it happens.  Remove soap buildup in the tub or shower regularly.  Attach bath mats securely with double-sided non-slip rug tape.  Do not have throw rugs and other things on the floor that can make you trip. What can I do in the bedroom?  Use night lights.  Make sure that you have a light by your bed that is easy to reach.  Do not use any sheets or blankets that are too big for your bed. They should not hang down onto the floor.  Have a firm chair that has side arms. You can use this for support while you get dressed.  Do not have throw rugs and other things on the floor that can make you trip. What can I do in the kitchen?  Clean up any spills right away.  Avoid walking on wet floors.  Keep items that you use a lot in easy-to-reach places.  If you need to reach something above you, use a strong step  stool that has a grab bar.  Keep electrical cords out of the way.  Do not use floor polish or wax that makes floors slippery. If you must use wax, use non-skid floor wax.  Do not have throw rugs and other things on the floor that can make you trip. What can I do with my stairs?  Do not leave any items on the stairs.  Make sure that there are handrails on both sides of the stairs and use them. Fix handrails that are broken or loose. Make sure that handrails are as long as the stairways.  Check any carpeting to make sure that it is firmly attached to the stairs. Fix any carpet that is loose or worn.  Avoid having throw rugs at the top or bottom of the stairs. If you do have throw rugs, attach them to the floor with carpet tape.  Make sure that you have a light switch at the top of the stairs and the bottom of the stairs. If you do not  have them, ask someone to add them for you. What else can I do to help prevent falls?  Wear shoes that:  Do not have high heels.  Have rubber bottoms.  Are comfortable and fit you well.  Are closed at the toe. Do not wear sandals.  If you use a stepladder:  Make sure that it is fully opened. Do not climb a closed stepladder.  Make sure that both sides of the stepladder are locked into place.  Ask someone to hold it for you, if possible.  Clearly mark and make sure that you can see:  Any grab bars or handrails.  First and last steps.  Where the edge of each step is.  Use tools that help you move around (mobility aids) if they are needed. These include:  Canes.  Walkers.  Scooters.  Crutches.  Turn on the lights when you go into a dark area. Replace any light bulbs as soon as they burn out.  Set up your furniture so you have a clear path. Avoid moving your furniture around.  If any of your floors are uneven, fix them.  If there are any pets around you, be aware of where they are.  Review your medicines with your doctor. Some medicines can make you feel dizzy. This can increase your chance of falling. Ask your doctor what other things that you can do to help prevent falls. This information is not intended to replace advice given to you by your health care provider. Make sure you discuss any questions you have with your health care provider. Document Released: 11/04/2008 Document Revised: 06/16/2015 Document Reviewed: 02/12/2014 Elsevier Interactive Patient Education  2017 [employer].

## 2019-07-15 ENCOUNTER — Other Ambulatory Visit: Payer: Self-pay | Admitting: Family Medicine

## 2019-07-15 DIAGNOSIS — G4709 Other insomnia: Secondary | ICD-10-CM

## 2019-07-16 NOTE — Telephone Encounter (Signed)
Tried to contact Pt and got her voicemail. Left a message for her to return call.

## 2019-08-31 ENCOUNTER — Other Ambulatory Visit: Payer: Self-pay | Admitting: Family Medicine

## 2019-08-31 DIAGNOSIS — G4709 Other insomnia: Secondary | ICD-10-CM

## 2019-08-31 NOTE — Telephone Encounter (Signed)
Requested medication (s) are due for refill today:   Provider to determine  Requested medication (s) are on the active medication list:   Yes  Future visit scheduled:   Yes   Last ordered: 04/29/2019 #30, RF 0  Non delegated refill   Requested Prescriptions  Pending Prescriptions Disp Refills   ALPRAZolam (XANAX) 0.5 MG tablet [Pharmacy Med Name: ALPRAZOLAM  0.5MG   TAB] 30 tablet     Sig: TAKE 1 TABLET BY MOUTH  DAILY AS NEEDED FOR ANXIETY      Not Delegated - Psychiatry:  Anxiolytics/Hypnotics Failed - 08/31/2019 12:25 PM      Failed - This refill cannot be delegated      Failed - Urine Drug Screen completed in last 360 days.      Passed - Valid encounter within last 6 months    Recent Outpatient Visits           4 months ago Dyslipidemia   [facility] Steele Sizer, MD   4 months ago Dyslipidemia   [facility] Steele Sizer, MD   1 year ago Angina pectoris syndrome [facility])   [facility] Steele Sizer, MD   1 year ago Angina pectoris syndrome [facility])   [facility] Steele Sizer, MD   1 year ago Mild major depression [facility])   [facility] Steele Sizer, MD       Future Appointments             In 10 months [place]

## 2019-09-02 NOTE — Telephone Encounter (Signed)
lvm to sch a appt for med refill per dr

## 2019-09-04 ENCOUNTER — Telehealth (INDEPENDENT_AMBULATORY_CARE_PROVIDER_SITE_OTHER): Payer: Medicare Other | Admitting: Family Medicine

## 2019-09-04 ENCOUNTER — Other Ambulatory Visit: Payer: Self-pay

## 2019-09-04 ENCOUNTER — Encounter: Payer: Self-pay | Admitting: Family Medicine

## 2019-09-04 VITALS — BP 138/83 | HR 107 | Temp 97.3°F | Wt 142.0 lb

## 2019-09-04 DIAGNOSIS — E8881 Metabolic syndrome: Secondary | ICD-10-CM

## 2019-09-04 DIAGNOSIS — I209 Angina pectoris, unspecified: Secondary | ICD-10-CM

## 2019-09-04 DIAGNOSIS — I2510 Atherosclerotic heart disease of native coronary artery without angina pectoris: Secondary | ICD-10-CM

## 2019-09-04 DIAGNOSIS — I10 Essential (primary) hypertension: Secondary | ICD-10-CM

## 2019-09-04 DIAGNOSIS — E538 Deficiency of other specified B group vitamins: Secondary | ICD-10-CM

## 2019-09-04 DIAGNOSIS — F320 Major depressive disorder, single episode, mild: Secondary | ICD-10-CM | POA: Diagnosis not present

## 2019-09-04 DIAGNOSIS — M545 Low back pain, unspecified: Secondary | ICD-10-CM

## 2019-09-04 DIAGNOSIS — E785 Hyperlipidemia, unspecified: Secondary | ICD-10-CM | POA: Diagnosis not present

## 2019-09-04 DIAGNOSIS — G4709 Other insomnia: Secondary | ICD-10-CM

## 2019-09-04 MED ORDER — BUPROPION HCL ER (XL) 300 MG PO TB24: 300.0000 mg | ORAL_TABLET | Freq: Every day | ORAL | 1 refills | Status: DC

## 2019-09-04 MED ORDER — ALPRAZOLAM 0.5 MG PO TABS: 0.5000 mg | ORAL_TABLET | Freq: Every day | ORAL | 0 refills | Status: DC | PRN

## 2019-09-04 MED ORDER — CARVEDILOL 3.125 MG PO TABS: 3.1250 mg | ORAL_TABLET | Freq: Two times a day (BID) | ORAL | 1 refills | Status: DC

## 2019-09-04 MED ORDER — CYCLOBENZAPRINE HCL 5 MG PO TABS: 5.0000 mg | ORAL_TABLET | Freq: Every day | ORAL | 1 refills | Status: DC

## 2019-09-04 MED ORDER — EZETIMIBE 10 MG PO TABS: 10.0000 mg | ORAL_TABLET | Freq: Every day | ORAL | 1 refills | Status: DC

## 2019-09-04 MED ORDER — LISINOPRIL 10 MG PO TABS: 10.0000 mg | ORAL_TABLET | Freq: Every day | ORAL | 1 refills | Status: DC

## 2019-09-04 MED ORDER — ROSUVASTATIN CALCIUM 20 MG PO TABS: 20.0000 mg | ORAL_TABLET | Freq: Every day | ORAL | 1 refills | Status: DC

## 2019-09-04 NOTE — Progress Notes (Signed)
Name: Elizabeth Johnson   MRN: [id number]    DOB: 02-May-1950   Date:09/04/2019       Progress Note  Subjective  Chief Complaint  Chief Complaint  Patient presents with   Medication Refill    Needs Alprazolam refill.    I connected with  Elizabeth Johnson  on 09/04/19 at  2:40 PM EDT by a video enabled telemedicine application and verified that I am speaking with the correct person using two identifiers.  I discussed the limitations of evaluation and management by telemedicine and the availability of in person appointments. The patient expressed understanding and agreed to proceed. Staff also discussed with the patient that there may be a patient responsible charge related to this service. Patient Location: home Provider Location: [place]  HPI  Hyperlipidemia: shewas on Pravastatin and we changed to Crestor, and she has noticed myalgia again, she is now on lower dose of Crestor, last LDL was down to 95 , but goal is 70 , we will add Zetia today   HTN: patient is doing well, no chest pain , palpitation or SOB.Dizziness has resolved.   Major Depression:finally adjusting on not working, forced to retired back in 2017. She is taking Wellbutrin XL 300 mg daily, takes alrpazolam prn for sleep, both of her dogs are old and sickly    Back pain: she has mild low back pain, Flexeril qhs prn only, stable. Described as spasms, no radiculitis, denies pain at this time.   Insomnia: she takes alprazolam very seldom and needs a refills today   Angina pectoris: history of MI, denies recent chest pain, has NTG at home, on beta-blocker, statin therapy and ACE, also takes aspirin. Used to see Elizabeth Johnson   Hyperglycemia: denies polyphagia, polydipsia or polyuria, she needs to come in for labs.   Patient Active Problem List   Diagnosis Date Noted   Angina pectoris syndrome ([place]) 04/29/2019   Trigger thumb of right hand 12/12/2017   Post-traumatic osteoarthritis of right wrist 12/12/2017    Biceps tendon rupture 09/21/2015   H/O hysterectomy for benign disease 09/02/2014   Acquired bilateral hammer toes 09/02/2014   External hemorrhoid 09/02/2014   Allergic rhinitis 07/19/2014   Carpal tunnel syndrome on right 07/19/2014   Insomnia, persistent 07/19/2014   Chronic LBP 07/19/2014   Mild major depression ([place]) 07/19/2014   Dyslipidemia 07/19/2014   Hemorrhoid 54/65/0354   Dysmetabolic syndrome 65/68/1275   Coronary artery disease involving left main coronary artery 07/19/2014   Osteopenia 07/19/2014   Benign essential HTN 02/04/2009   H/O acute myocardial infarction of lateral wall 07/18/2004    Past Surgical History:  Procedure Laterality Date   ABDOMINAL HYSTERECTOMY     BUNIONECTOMY Bilateral 1973   CARPAL TUNNEL RELEASE Right 04/15/2017   Procedure: CARPAL TUNNEL RELEASE;  Surgeon: Elizabeth Leys, MD;  Location: [facility] ORS;  Service: Orthopedics;  Laterality: Right;   CATARACT EXTRACTION, BILATERAL  05/21 & 06/21   COLONOSCOPY WITH PROPOFOL N/A 11/23/2016   Procedure: COLONOSCOPY WITH PROPOFOL;  Surgeon: Elizabeth Bellows, MD;  Location: [facility] ENDOSCOPY;  Service: Gastroenterology;  Laterality: N/A;   CORONARY STENT PLACEMENT  2009   ELBOW SURGERY Left 1977   HAMMER TOE SURGERY Right 1992   RIB FRACTURE SURGERY     WRIST FRACTURE SURGERY Right 1977    Family History  Problem Relation Age of Onset   Alzheimer's disease Mother    Breast cancer Mother 30   CAD Father    Heart attack Father  Heart failure Father    COPD Father    Drug abuse Sister    Suicidality Sister    Heart attack Brother     Social History   Socioeconomic History   Marital status: Significant Other    Spouse name: Not on file   Number of children: 1   Years of education: Not on file   Highest education level: Not on file  Occupational History   Occupation: retired   Tobacco Use   Smoking status: Former Smoker    Years: 8.00    Types:  Cigarettes    Start date: 01/23/1999    Quit date: 05/23/2007    Years since quitting: 12.2   Smokeless tobacco: Never Used  Vaping Use   Vaping Use: Never used  Substance and Sexual Activity   Alcohol use: Yes    Alcohol/week: 0.0 standard drinks    Comment: occasional   Drug use: No   Sexual activity: Yes    Partners: Male  Other Topics Concern   Not on file  Social History Narrative   She worked for the same company for 20 years and they closed [place] branch and she was forced to retire Fall 2017. When her position was moved to [facility] with fiance of 25 years.    Social Determinants of Health   Financial Resource Strain: Low Risk    Difficulty of Paying Living Expenses: Not hard at all  Food Insecurity: No Food Insecurity   Worried About Charity fundraiser in the Last Year: Never true   [place] in the Last Year: Never true  Transportation Needs: No Transportation Needs   Lack of Transportation (Medical): No   Lack of Transportation (Non-Medical): No  Physical Activity: Inactive   Days of Exercise per Week: 0 days   Minutes of Exercise per Session: 0 min  Stress: No Stress Concern Present   Feeling of Stress : Not at all  Social Connections: Moderately Isolated   Frequency of Communication with Friends and Family: More than three times a week   Frequency of Social Gatherings with Friends and Family: More than three times a week   Attends Religious Services: Never   Marine scientist or Organizations: No   Attends Music therapist: Never   Marital Status: Living with partner  Intimate Partner Violence: Not At Risk   Fear of Current or Ex-Partner: No   Emotionally Abused: No   Physically Abused: No   Sexually Abused: No     Current Outpatient Medications:    acetaminophen (TYLENOL) 500 MG tablet, Take 1 tablet (500 mg total) by mouth every 6 (six) hours., Disp: 360 tablet, Rfl: 1   ALPRAZolam (XANAX) 0.5 MG  tablet, Take 1 tablet (0.5 mg total) by mouth daily as needed for anxiety., Disp: 30 tablet, Rfl: 0   aspirin 81 MG chewable tablet, Chew 81 mg by mouth daily. , Disp: , Rfl:    buPROPion (WELLBUTRIN XL) 300 MG 24 hr tablet, TAKE 1 TABLET BY MOUTH  DAILY, Disp: 90 tablet, Rfl: 1   carvedilol (COREG) 3.125 MG tablet, TAKE 1 TABLET BY MOUTH  TWICE DAILY WITH A MEAL, Disp: 180 tablet, Rfl: 1   cholecalciferol (VITAMIN D3) 25 MCG (1000 UNIT) tablet, Take 1,000 Units by mouth daily., Disp: , Rfl:    cyclobenzaprine (FLEXERIL) 10 MG tablet, Take 0.5-1 tablets (5-10 mg total) by mouth at bedtime., Disp: 90 tablet, Rfl: 0   lisinopril (  ZESTRIL) 10 MG tablet, TAKE 1 TABLET BY MOUTH  DAILY, Disp: 90 tablet, Rfl: 1   nitroGLYCERIN (NITROSTAT) 0.4 MG SL tablet, Place 1 tablet (0.4 mg total) under the tongue every 5 (five) minutes as needed for chest pain., Disp: 10 tablet, Rfl: 2   rosuvastatin (CRESTOR) 20 MG tablet, TAKE 1 TABLET BY MOUTH  DAILY, Disp: 90 tablet, Rfl: 1  Allergies  Allergen Reactions   Codeine Other (See Comments)    Sensitivity. Nauseated     I personally reviewed active problem list, medication list, allergies, family history, social history, health maintenance with the patient/caregiver today.   ROS  Ten systems reviewed and is negative except as mentioned in HPI   Objective  Virtual encounter, vitals obtained at home  Vitals:   09/04/19 1504  BP: 138/83  Pulse: (!) 107  Temp: (!) 97.3 F (36.3 C)    There is no height or weight on file to calculate BMI.  Physical Exam  Awake, alert and oriented  PHQ2/9: Depression screen [facility] 2/9 09/04/2019 07/14/2019 04/29/2019 04/28/2019 05/01/2018  Decreased Interest 0 0 0 0 0  Down, Depressed, Hopeless 0 0 0 0 2  PHQ - 2 Score 0 0 0 0 2  Altered sleeping 0 - 0 0 0  Tired, decreased energy 0 - 0 0 2  Change in appetite 0 - 0 0 0  Feeling bad or failure about yourself  0 - 0 0 0  Trouble concentrating 0 - 0 0 -  Moving  slowly or fidgety/restless 0 - 0 0 0  Suicidal thoughts 0 - 0 0 0  PHQ-9 Score 0 - 0 0 4  Difficult doing work/chores - - - Not difficult at all Not difficult at all   PHQ-2/9 Result is negative.    Fall Risk: Fall Risk  09/04/2019 07/14/2019 04/29/2019 04/28/2019 12/12/2017  Falls in the past year? 0 1 0 0 0  Number falls in past yr: 0 0 0 0 0  Injury with Fall? 0 0 0 0 0  Risk for fall due to : - No Fall Risks - - -  Follow up - Falls prevention discussed - - -     Assessment & Plan  1. Benign essential HTN  - carvedilol (COREG) 3.125 MG tablet; Take 1 tablet (3.125 mg total) by mouth 2 (two) times daily with a meal.  Dispense: 180 tablet; Refill: 1 - lisinopril (ZESTRIL) 10 MG tablet; Take 1 tablet (10 mg total) by mouth daily.  Dispense: 90 tablet; Refill: 1  2. Dyslipidemia  - ezetimibe (ZETIA) 10 MG tablet; Take 1 tablet (10 mg total) by mouth daily.  Dispense: 90 tablet; Refill: 1 - rosuvastatin (CRESTOR) 20 MG tablet; Take 1 tablet (20 mg total) by mouth daily.  Dispense: 90 tablet; Refill: 1  3. Mild major depression (HCC)  - buPROPion (WELLBUTRIN XL) 300 MG 24 hr tablet; Take 1 tablet (300 mg total) by mouth daily.  Dispense: 90 tablet; Refill: 1  4. Angina pectoris syndrome ([place])   5. B12 deficiency  Take supplements   6. Coronary artery disease involving left main coronary artery  - carvedilol (COREG) 3.125 MG tablet; Take 1 tablet (3.125 mg total) by mouth 2 (two) times daily with a meal.  Dispense: 180 tablet; Refill: 1 - lisinopril (ZESTRIL) 10 MG tablet; Take 1 tablet (10 mg total) by mouth daily.  Dispense: 90 tablet; Refill: 1 - ezetimibe (ZETIA) 10 MG tablet; Take 1 tablet (10 mg total) by mouth daily.  Dispense: 90 tablet; Refill: 1 - rosuvastatin (CRESTOR) 20 MG tablet; Take 1 tablet (20 mg total) by mouth daily.  Dispense: 90 tablet; Refill: 1  7. Dysmetabolic syndrome   8. Other insomnia  - ALPRAZolam (XANAX) 0.5 MG tablet; Take 1 tablet (0.5 mg  total) by mouth daily as needed for anxiety.  Dispense: 30 tablet; Refill: 0  9. Intermittent low back pain  - cyclobenzaprine (FLEXERIL) 5 MG tablet; Take 1 tablet (5 mg total) by mouth at bedtime.  Dispense: 90 tablet; Refill: 1  I discussed the assessment and treatment plan with the patient. The patient was provided an opportunity to ask questions and all were answered. The patient agreed with the plan and demonstrated an understanding of the instructions.  The patient was advised to call back or seek an in-person evaluation if the symptoms worsen or if the condition fails to improve as anticipated.  I provided 25 minutes of non-face-to-face time during this encounter.

## 2019-12-22 ENCOUNTER — Other Ambulatory Visit: Payer: Self-pay | Admitting: Family Medicine

## 2019-12-22 DIAGNOSIS — G4709 Other insomnia: Secondary | ICD-10-CM

## 2019-12-22 NOTE — Telephone Encounter (Signed)
Requested medication (s) are due for refill today: yes  Requested medication (s) are on the active medication list: yes  Last refill:  09/04/19  #30  0 refills  Future visit scheduled: yes  Notes to clinic:  Not delegated    Requested Prescriptions  Pending Prescriptions Disp Refills   ALPRAZolam (XANAX) 0.5 MG tablet [Pharmacy Med Name: ALPRAZolam 0.5 MG Oral Tablet] 30 tablet     Sig: TAKE 1 TABLET BY MOUTH  DAILY AS NEEDED FOR ANXIETY      Not Delegated - Psychiatry:  Anxiolytics/Hypnotics Failed - 12/22/2019 11:54 AM      Failed - This refill cannot be delegated      Failed - Urine Drug Screen completed in last 360 days      Passed - Valid encounter within last 6 months    Recent Outpatient Visits           3 months ago Benign essential HTN   [facility] Steele Sizer, MD   7 months ago Dyslipidemia   [facility] Steele Sizer, MD   7 months ago Dyslipidemia   [facility] Steele Sizer, MD   1 year ago Angina pectoris syndrome [facility])   [facility] Steele Sizer, MD   1 year ago Angina pectoris syndrome [facility])   [facility] Steele Sizer, MD       Future Appointments             In 2 months Ancil Boozer, Drue Stager, MD [facility], [place]   In 6 months  [facility], [facility]

## 2020-01-22 ENCOUNTER — Other Ambulatory Visit: Payer: Self-pay | Admitting: Family Medicine

## 2020-01-22 DIAGNOSIS — F320 Major depressive disorder, single episode, mild: Secondary | ICD-10-CM

## 2020-01-22 DIAGNOSIS — I2510 Atherosclerotic heart disease of native coronary artery without angina pectoris: Secondary | ICD-10-CM

## 2020-01-22 DIAGNOSIS — E785 Hyperlipidemia, unspecified: Secondary | ICD-10-CM

## 2020-01-25 ENCOUNTER — Other Ambulatory Visit: Payer: Self-pay

## 2020-02-07 ENCOUNTER — Other Ambulatory Visit: Payer: Self-pay | Admitting: Family Medicine

## 2020-02-07 DIAGNOSIS — G4709 Other insomnia: Secondary | ICD-10-CM

## 2020-02-07 NOTE — Telephone Encounter (Signed)
Requested medication (s) are due for refill today: no  Requested medication (s) are on the active medication list: yes  Last refill:  09/04/2019  Future visit scheduled: yes  Notes to clinic: this refill cannot be delegated    Requested Prescriptions  Pending Prescriptions Disp Refills   ALPRAZolam (XANAX) 0.5 MG tablet [Pharmacy Med Name: ALPRAZolam 0.5 MG Oral Tablet] 30 tablet     Sig: TAKE 1 TABLET BY MOUTH  DAILY AS NEEDED FOR ANXIETY      Not Delegated - Psychiatry:  Anxiolytics/Hypnotics Failed - 02/07/2020  1:25 PM      Failed - This refill cannot be delegated      Failed - Urine Drug Screen completed in last 360 days      Passed - Valid encounter within last 6 months    Recent Outpatient Visits           5 months ago Benign essential HTN   [facility] Steele Sizer, MD   9 months ago Dyslipidemia   [facility] Steele Sizer, MD   9 months ago Dyslipidemia   [facility] Steele Sizer, MD   1 year ago Angina pectoris syndrome [facility])   [facility] Steele Sizer, MD   1 year ago Angina pectoris syndrome [facility])   [facility] Steele Sizer, MD       Future Appointments             In 1 month Ancil Boozer, Drue Stager, MD [facility], [place]   In 5 months  [place]

## 2020-02-09 ENCOUNTER — Other Ambulatory Visit: Payer: Self-pay

## 2020-02-10 ENCOUNTER — Other Ambulatory Visit: Payer: Self-pay

## 2020-02-11 ENCOUNTER — Other Ambulatory Visit: Payer: Self-pay

## 2020-02-11 NOTE — Telephone Encounter (Signed)
Pt called and stated she was advised that 5 pills were sent to pharmacy/ pt checked with pharmacy and they stated they didn't receive anything / Pt asked if they can be sent to  [facility] [place] Phone:  [phone]  Fax:  [phone]     Derrek Monaco advise

## 2020-02-15 ENCOUNTER — Other Ambulatory Visit: Payer: Self-pay

## 2020-02-15 DIAGNOSIS — G4709 Other insomnia: Secondary | ICD-10-CM

## 2020-02-16 ENCOUNTER — Other Ambulatory Visit: Payer: Self-pay

## 2020-02-16 DIAGNOSIS — G4709 Other insomnia: Secondary | ICD-10-CM

## 2020-02-16 MED ORDER — ALPRAZOLAM 0.5 MG PO TABS: 0.5000 mg | ORAL_TABLET | Freq: Every day | ORAL | 0 refills | Status: DC | PRN

## 2020-03-08 NOTE — Progress Notes (Unsigned)
Name: Elizabeth Johnson   MRN: [id number]    DOB: Apr 09, 1950   Date:03/10/2020       Progress Note  Subjective  Chief Complaint  Follow up   HPI   Hyperlipidemia: shewas on Pravastatin and we changed to Crestor, and she has noticed myalgia again, she is now on lower dose of Crestor, last LDL was down to 95 , but goal is 70 , we added  Zetia on her last visit but she has not been taking it, she states she will start it now and we will recheck labs next visit   HTN: towards high end of normal, we will adjust dose of lisinopril and monitor for side effects. Currently no chest pain , palpitation or SOB.  Major Depression:finally adjusting on not working, forced to retired back in 2017. She is taking Wellbutrin XL 300 mg daily, takes alrpazolam prn for sleep or anxiety, mind is busy at night and hard to relax, worries about her dogs. She states had to take alprazolam a little more often, we will try adding hydroxyzine to see if helps her and eventually stop alprazolam if tolerated   Back pain: she has mild low back pain, Flexeril qhs prn only, stable. Described as spasms, no radiculitis, denies pain at this time. Unchanged   Insomnia: she takes alprazolam very seldom and needs a refills today , we will also try adding hydroxyzine   Angina pectoris: history of MI, denies recent chest pain, has NTG at home but expired and needs a refill , on beta-blocker, statin therapy and ACE, plus aspirin. Used to see Dr Clayborn Bigness but no recent visits. On medical management only Advised to go back to see him since MI was back in 2009   Hyperglycemia: denies polyphagia, polydipsia or polyuria, last A1C was better down from 6.2 % to 5.8 %    Patient Active Problem List   Diagnosis Date Noted  . Angina pectoris syndrome ([place]) 04/29/2019  . Trigger thumb of right hand 12/12/2017  . Post-traumatic osteoarthritis of right wrist 12/12/2017  . Biceps tendon rupture 09/21/2015  . H/O hysterectomy for  benign disease 09/02/2014  . Acquired bilateral hammer toes 09/02/2014  . External hemorrhoid 09/02/2014  . Allergic rhinitis 07/19/2014  . Carpal tunnel syndrome on right 07/19/2014  . Insomnia, persistent 07/19/2014  . Chronic LBP 07/19/2014  . Mild major depression ([place]) 07/19/2014  . Dyslipidemia 07/19/2014  . Hemorrhoid 07/19/2014  . Dysmetabolic syndrome 82/99/3716  . 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 Osteopenia 07/19/2014  . Benign essential HTN 02/04/2009  . H/O acute myocardial infarction of lateral wall 07/18/2004    Past Surgical History:  Procedure Laterality Date  . ABDOMINAL HYSTERECTOMY    . BUNIONECTOMY Bilateral 1973  . CARPAL TUNNEL RELEASE Right 04/15/2017   Procedure: CARPAL TUNNEL RELEASE;  Surgeon: Earnestine Leys, MD;  Location: [facility] ORS;  Service: Orthopedics;  Laterality: Right;  . CATARACT EXTRACTION, BILATERAL  05/21 & 06/21  . COLONOSCOPY WITH PROPOFOL N/A 11/23/2016   Procedure: COLONOSCOPY WITH PROPOFOL;  Surgeon: [name], MD;  Location: [facility] ENDOSCOPY;  Service: Gastroenterology;  Laterality: N/A;  . CORONARY STENT PLACEMENT  2009  . ELBOW SURGERY Left 1977  . HAMMER TOE SURGERY Right 1992  . RIB FRACTURE SURGERY    . WRIST FRACTURE SURGERY Right 1977    Family History  Problem Relation Age of Onset  . Alzheimer's disease Mother   . Breast cancer Mother 56  . CAD Father   .  Heart attack Father   . Heart failure Father   . COPD Father   . Drug abuse Sister   . Suicidality Sister   . Heart attack Brother     Social History   Tobacco Use  . Smoking status: Former Smoker    Years: 8.00    Types: Cigarettes    Start date: 01/23/1999    Quit date: 05/23/2007    Years since quitting: 12.8  . Smokeless tobacco: Never Used  Substance Use Topics  . Alcohol use: Yes    Alcohol/week: 0.0 standard drinks    Comment: occasional     Current Outpatient Medications:  .  ALPRAZolam (XANAX) 0.5 MG tablet,  Take 1 tablet (0.5 mg total) by mouth daily as needed for anxiety., Disp: 5 tablet, Rfl: 0 .  aspirin 81 MG chewable tablet, Chew 81 mg by mouth daily. , Disp: , Rfl:  .  buPROPion (WELLBUTRIN XL) 300 MG 24 hr tablet, TAKE 1 TABLET BY MOUTH  DAILY, Disp: 90 tablet, Rfl: 0 .  carvedilol (COREG) 3.125 MG tablet, Take 1 tablet (3.125 mg total) by mouth 2 (two) times daily with a meal., Disp: 180 tablet, Rfl: 1 .  cyclobenzaprine (FLEXERIL) 5 MG tablet, Take 1 tablet (5 mg total) by mouth at bedtime., Disp: 90 tablet, Rfl: 1 .  lisinopril (ZESTRIL) 10 MG tablet, Take 1 tablet (10 mg total) by mouth daily., Disp: 90 tablet, Rfl: 1 .  nitroGLYCERIN (NITROSTAT) 0.4 MG SL tablet, Place 1 tablet (0.4 mg total) under the tongue every 5 (five) minutes as needed for chest pain., Disp: 10 tablet, Rfl: 2 .  rosuvastatin (CRESTOR) 20 MG tablet, TAKE 1 TABLET BY MOUTH  DAILY, Disp: 90 tablet, Rfl: 3 .  acetaminophen (TYLENOL) 500 MG tablet, Take 1 tablet (500 mg total) by mouth every 6 (six) hours. (Patient not taking: Reported on 03/10/2020), Disp: 360 tablet, Rfl: 1 .  cholecalciferol (VITAMIN D3) 25 MCG (1000 UNIT) tablet, Take 1,000 Units by mouth daily. (Patient not taking: Reported on 03/10/2020), Disp: , Rfl:  .  ezetimibe (ZETIA) 10 MG tablet, Take 1 tablet (10 mg total) by mouth daily. (Patient not taking: Reported on 03/10/2020), Disp: 90 tablet, Rfl: 1  Allergies  Allergen Reactions  . Codeine Other (See Comments)    Sensitivity. Nauseated     I personally reviewed active problem list, medication list, allergies, family history, social history, health maintenance with the patient/caregiver today.   ROS  Constitutional: Negative for fever or weight change.  Respiratory: Negative for cough and shortness of breath.   Cardiovascular: Negative for chest pain or palpitations.  Gastrointestinal: Negative for abdominal pain, no bowel changes.  Musculoskeletal: Negative for gait problem or joint swelling.   Skin: Negative for rash.  Neurological: Negative for dizziness or headache.  No other specific complaints in a complete review of systems (except as listed in HPI above).  Objective  Vitals:   03/10/20 1047  BP: 130/72  Pulse: 61  Resp: 16  Temp: 98.1 F (36.7 C)  TempSrc: Oral  SpO2: 99%  Weight: 147 lb (66.7 kg)  Height: 5\' 2"  (1.575 m)    Body mass index is 26.89 kg/m.  Physical Exam  Constitutional: Patient appears well-developed and well-nourished. Overweight.  No distress.  HEENT: head atraumatic, normocephalic, pupils equal and reactive to light, neck supple, fullness on left side of neck more than right side, no redness, no pain, possible small lymphonodi above left clavicle , throat within normal limits Cardiovascular: Normal  rate, regular rhythm and normal heart sounds.  No murmur heard. No BLE edema. Pulmonary/Chest: Effort normal and breath sounds normal. No respiratory distress. Abdominal: Soft.  There is no tenderness. Psychiatric: Patient has a normal mood and affect. behavior is normal. Judgment and thought content normal.  PHQ2/9: Depression screen [facility] 2/9 03/10/2020 09/04/2019 07/14/2019 04/29/2019 04/28/2019  Decreased Interest 0 0 0 0 0  Down, Depressed, Hopeless 0 0 0 0 0  PHQ - 2 Score 0 0 0 0 0  Altered sleeping - 0 - 0 0  Tired, decreased energy - 0 - 0 0  Change in appetite - 0 - 0 0  Feeling bad or failure about yourself  - 0 - 0 0  Trouble concentrating - 0 - 0 0  Moving slowly or fidgety/restless - 0 - 0 0  Suicidal thoughts - 0 - 0 0  PHQ-9 Score - 0 - 0 0  Difficult doing work/chores - - - - Not difficult at all    phq 9 is negative   Fall Risk: Fall Risk  03/10/2020 09/04/2019 07/14/2019 04/29/2019 04/28/2019  Falls in the past year? 1 0 1 0 0  Number falls in past yr: 1 0 0 0 0  Injury with Fall? 0 0 0 0 0  Risk for fall due to : - - No Fall Risks - -  Follow up - - Falls prevention discussed - -     Functional Status Survey: Is the  patient deaf or have difficulty hearing?: No Does the patient have difficulty seeing, even when wearing glasses/contacts?: No Does the patient have difficulty concentrating, remembering, or making decisions?: No Does the patient have difficulty walking or climbing stairs?: No Does the patient have difficulty dressing or bathing?: No Does the patient have difficulty doing errands alone such as visiting a doctor's office or shopping?: No    Assessment & Plan  1. Other insomnia  - ALPRAZolam (XANAX) 0.5 MG tablet; Take 1 tablet (0.5 mg total) by mouth daily as needed for anxiety.  Dispense: 30 tablet; Refill: 0  2. Benign essential HTN  - carvedilol (COREG) 3.125 MG tablet; Take 1 tablet (3.125 mg total) by mouth 2 (two) times daily with a meal.  Dispense: 180 tablet; Refill: 1 - lisinopril (ZESTRIL) 20 MG tablet; Take 1 tablet (20 mg total) by mouth daily.  Dispense: 90 tablet; Refill: 0  3. Coronary artery disease involving left main coronary artery  - carvedilol (COREG) 3.125 MG tablet; Take 1 tablet (3.125 mg total) by mouth 2 (two) times daily with a meal.  Dispense: 180 tablet; Refill: 1 - lisinopril (ZESTRIL) 20 MG tablet; Take 1 tablet (20 mg total) by mouth daily.  Dispense: 90 tablet; Refill: 0  4. Mild major depression (HCC)  - buPROPion (WELLBUTRIN XL) 300 MG 24 hr tablet; Take 1 tablet (300 mg total) by mouth daily.  Dispense: 90 tablet; Refill: 0  5. Angina pectoris syndrome (H[facility])  - nitroGLYCERIN (NITROSTAT) 0.4 MG SL tablet; Place 1 tablet (0.4 mg total) under the tongue every 5 (five) minutes as needed for chest pain.  Dispense: 10 tablet; Refill: 2  She will call me when ready to go back to cardiologist, discussed importance of seeing someone and consider stress test to monitor   6. B12 deficiency  Continue otc supplementation   7. Dyslipidemia  Resume Zetia and recheck labs next visit   8. Intermittent low back pain   9. Dysmetabolic syndrome   10.  Neck fullness  -  US SOFT TISSUE HEAD & NECK (NON-THYROID); Future  11. Breast cancer screening by mammogram  - MM 3D SCREEN BREAST BILATERAL; Future

## 2020-03-10 ENCOUNTER — Ambulatory Visit (INDEPENDENT_AMBULATORY_CARE_PROVIDER_SITE_OTHER): Payer: Medicare Other | Admitting: Family Medicine

## 2020-03-10 ENCOUNTER — Encounter: Payer: Self-pay | Admitting: Family Medicine

## 2020-03-10 ENCOUNTER — Other Ambulatory Visit: Payer: Self-pay

## 2020-03-10 VITALS — BP 130/72 | HR 61 | Temp 98.1°F | Resp 16 | Ht 62.0 in | Wt 147.0 lb

## 2020-03-10 DIAGNOSIS — Z1231 Encounter for screening mammogram for malignant neoplasm of breast: Secondary | ICD-10-CM

## 2020-03-10 DIAGNOSIS — I2510 Atherosclerotic heart disease of native coronary artery without angina pectoris: Secondary | ICD-10-CM

## 2020-03-10 DIAGNOSIS — I10 Essential (primary) hypertension: Secondary | ICD-10-CM

## 2020-03-10 DIAGNOSIS — G4709 Other insomnia: Secondary | ICD-10-CM

## 2020-03-10 DIAGNOSIS — E785 Hyperlipidemia, unspecified: Secondary | ICD-10-CM

## 2020-03-10 DIAGNOSIS — E8881 Metabolic syndrome: Secondary | ICD-10-CM

## 2020-03-10 DIAGNOSIS — F320 Major depressive disorder, single episode, mild: Secondary | ICD-10-CM

## 2020-03-10 DIAGNOSIS — R221 Localized swelling, mass and lump, neck: Secondary | ICD-10-CM

## 2020-03-10 DIAGNOSIS — E538 Deficiency of other specified B group vitamins: Secondary | ICD-10-CM | POA: Diagnosis not present

## 2020-03-10 DIAGNOSIS — I209 Angina pectoris, unspecified: Secondary | ICD-10-CM

## 2020-03-10 DIAGNOSIS — M545 Low back pain, unspecified: Secondary | ICD-10-CM

## 2020-03-10 MED ORDER — ALPRAZOLAM 0.5 MG PO TABS: 0.5000 mg | ORAL_TABLET | Freq: Every day | ORAL | 0 refills | Status: DC | PRN

## 2020-03-10 MED ORDER — LISINOPRIL 20 MG PO TABS: 20.0000 mg | ORAL_TABLET | Freq: Every day | ORAL | 0 refills | Status: DC

## 2020-03-10 MED ORDER — BUPROPION HCL ER (XL) 300 MG PO TB24: 300.0000 mg | ORAL_TABLET | Freq: Every day | ORAL | 0 refills | Status: DC

## 2020-03-10 MED ORDER — CARVEDILOL 3.125 MG PO TABS: 3.1250 mg | ORAL_TABLET | Freq: Two times a day (BID) | ORAL | 1 refills | Status: DC

## 2020-03-10 MED ORDER — NITROGLYCERIN 0.4 MG SL SUBL: 0.4000 mg | SUBLINGUAL_TABLET | SUBLINGUAL | 2 refills | Status: DC | PRN

## 2020-03-10 MED ORDER — HYDROXYZINE HCL 10 MG PO TABS
10.0000 mg | ORAL_TABLET | Freq: Three times a day (TID) | ORAL | 0 refills | Status: DC | PRN
Start: 2020-03-10 — End: 2020-04-14

## 2020-03-22 ENCOUNTER — Ambulatory Visit: Payer: Medicare Other

## 2020-03-31 ENCOUNTER — Ambulatory Visit
Admission: RE | Admit: 2020-03-31 | Discharge: 2020-03-31 | Disposition: A | Discharge status: Home / Self Care | Payer: Medicare Other | Source: Ambulatory Visit | Attending: Family Medicine | Admitting: Family Medicine

## 2020-03-31 ENCOUNTER — Other Ambulatory Visit: Payer: Self-pay

## 2020-03-31 DIAGNOSIS — R221 Localized swelling, mass and lump, neck: Secondary | ICD-10-CM | POA: Diagnosis not present

## 2020-04-14 ENCOUNTER — Other Ambulatory Visit: Payer: Self-pay | Admitting: Family Medicine

## 2020-04-14 DIAGNOSIS — M545 Low back pain, unspecified: Secondary | ICD-10-CM

## 2020-04-14 DIAGNOSIS — G4709 Other insomnia: Secondary | ICD-10-CM

## 2020-05-11 ENCOUNTER — Other Ambulatory Visit: Payer: Self-pay | Admitting: Family Medicine

## 2020-05-11 DIAGNOSIS — I209 Angina pectoris, unspecified: Secondary | ICD-10-CM

## 2020-05-11 NOTE — Telephone Encounter (Signed)
Requested Prescriptions  Pending Prescriptions Disp Refills  . nitroGLYCERIN (NITROSTAT) 0.4 MG SL tablet [Pharmacy Med Name: Nitroglycerin 0.4 MG Sublingual Tablet Sublingual] 25 tablet 3    Sig: DISSOLVE 1 TABLET UNDER THE TONGUE EVERY 5 MINUTES AS  NEEDED FOR CHEST PAIN. MAX  OF 3 TABLETS IN 15 MINUTES. CALL 911 IF PAIN PERSISTS.     Cardiovascular:  Nitrates Passed - 05/11/2020  6:18 AM      Passed - Last BP in normal range    BP Readings from Last 1 Encounters:  03/10/20 130/72         Passed - Last Heart Rate in normal range    Pulse Readings from Last 1 Encounters:  03/10/20 61         Passed - Valid encounter within last 12 months    Recent Outpatient Visits          2 months ago B12 deficiency   [facility] Steele Sizer, MD   8 months ago Benign essential HTN   [facility] Steele Sizer, MD   1 year ago Dyslipidemia   [facility] Steele Sizer, MD   1 year ago Dyslipidemia   [facility] Steele Sizer, MD   1 year ago Angina pectoris syndrome [facility])   [facility] Steele Sizer, MD      Future Appointments            In 2 months  [facility], [place]   In 2 months Steele Sizer, MD [facility], [place]   In 4 months Steele Sizer, MD [place]

## 2020-06-09 ENCOUNTER — Other Ambulatory Visit: Payer: Self-pay | Admitting: Family Medicine

## 2020-06-09 DIAGNOSIS — E785 Hyperlipidemia, unspecified: Secondary | ICD-10-CM

## 2020-06-09 DIAGNOSIS — I2510 Atherosclerotic heart disease of native coronary artery without angina pectoris: Secondary | ICD-10-CM

## 2020-06-09 NOTE — Telephone Encounter (Signed)
lvm informing pt that prescription has been sent to pharmacy

## 2020-06-09 NOTE — Telephone Encounter (Signed)
Requested medications are due for refill today yes  Requested medications are on the active medication list yes  Last refill 3/24  Last visit 02/2020  Future visit scheduled 07/2020  Notes to clinic Pharm note states the pt claims she no longer takes this, it is current on med list, please assess.

## 2020-06-23 ENCOUNTER — Other Ambulatory Visit: Payer: Self-pay | Admitting: Family Medicine

## 2020-06-23 DIAGNOSIS — I2510 Atherosclerotic heart disease of native coronary artery without angina pectoris: Secondary | ICD-10-CM

## 2020-06-23 DIAGNOSIS — I10 Essential (primary) hypertension: Secondary | ICD-10-CM

## 2020-07-10 ENCOUNTER — Other Ambulatory Visit: Payer: Self-pay | Admitting: Family Medicine

## 2020-07-10 DIAGNOSIS — F320 Major depressive disorder, single episode, mild: Secondary | ICD-10-CM

## 2020-07-11 ENCOUNTER — Telehealth: Payer: Self-pay

## 2020-07-11 ENCOUNTER — Other Ambulatory Visit: Payer: Self-pay | Admitting: Family Medicine

## 2020-07-11 DIAGNOSIS — I209 Angina pectoris, unspecified: Secondary | ICD-10-CM

## 2020-07-11 NOTE — Telephone Encounter (Signed)
Attempted to reach patient to reschedule AWV; left msg for patient to reach me at [phone]

## 2020-07-11 NOTE — Telephone Encounter (Signed)
   Notes to clinic has refills left on current rx. Mail order pharm asking for 1 year supply. Pt seen in 02/2020, has appt in 07/2020, please assess.

## 2020-07-11 NOTE — Telephone Encounter (Signed)
Copied from [place] [id number]. Topic: General - Other >> Jul 11, 2020 12:19 PM Leward Quan A wrote: Reason for CRM: Patient called in to inform the Health Nurse Advisor that she need to be called on Thursday but need to change to a later time of the day. Please advise call Ph# [phone]

## 2020-07-12 NOTE — Telephone Encounter (Signed)
Upcoming appt 7.13.2022 Last seen 2.17.2022

## 2020-07-14 ENCOUNTER — Ambulatory Visit: Payer: Medicare Other

## 2020-07-14 NOTE — Telephone Encounter (Signed)
Second attempt to reach patient. Left msg to reschedule. Appt canceled for today.

## 2020-07-26 ENCOUNTER — Ambulatory Visit: Payer: Medicare Other

## 2020-07-26 ENCOUNTER — Telehealth: Payer: Self-pay | Admitting: Family Medicine

## 2020-07-26 NOTE — Telephone Encounter (Signed)
Copied from [place] [phone]. Topic: Medicare AWV >> Jul 26, 2020  9:20 AM Cher Nakai R wrote: Reason for CRM:   [facility] called out - Left message to reschedule AWV from July 5 to August 18, 2020 at 2:10pm to do by phone

## 2020-08-02 NOTE — Progress Notes (Signed)
Name: Elizabeth Johnson   MRN: [id number]    DOB: 1950/10/03   Date:08/03/2020       Progress Note  Subjective  Chief Complaint  Annual Exam  HPI  Patient presents for annual CPE.  Diet: balanced diet  Exercise:  discussed 150 minutes per week   [place] Office Visit from 08/03/2020 in [facility]  AUDIT-C Score 4      Depression: Phq 9 is  positive  Depression screen [facility] 2/9 08/03/2020 03/10/2020 09/04/2019 07/14/2019 04/29/2019  Decreased Interest 0 0 0 0 0  Down, Depressed, Hopeless 1 0 0 0 0  PHQ - 2 Score 1 0 0 0 0  Altered sleeping 1 - 0 - 0  Tired, decreased energy 0 - 0 - 0  Change in appetite 0 - 0 - 0  Feeling bad or failure about yourself  0 - 0 - 0  Trouble concentrating 0 - 0 - 0  Moving slowly or fidgety/restless 0 - 0 - 0  Suicidal thoughts 0 - 0 - 0  PHQ-9 Score 2 - 0 - 0  Difficult doing work/chores - - - - -  Some recent data might be hidden   Hypertension: BP Readings from Last 3 Encounters:  08/03/20 112/78  03/10/20 130/72  09/04/19 138/83   Obesity: Wt Readings from Last 3 Encounters:  08/03/20 148 lb (67.1 kg)  03/10/20 147 lb (66.7 kg)  09/04/19 142 lb (64.4 kg)   BMI Readings from Last 3 Encounters:  08/03/20 27.07 kg/m  03/10/20 26.89 kg/m  09/04/19 25.97 kg/m     Vaccines:   Shingrix: 13-64 yo and ask insurance if covered when patient above 15 yo  Pneumonia: educated and discussed with patient. Flu: educated and discussed with patient.  Hep C Screening: 09/10/13 STD testing and prevention (HIV/chl/gon/syphilis): N/A Intimate partner violence: negative Sexual History : husband has ED, occasionally sexually active  Menstrual History/LMP/Abnormal Bleeding: hysterectomy  Incontinence Symptoms: she has episodes of urgency when she holds her urine for a long time   Breast cancer:  - Last Mammogram: Ordered 03/10/20 - BRCA gene screening: only mother had breast cancer .  Osteoporosis: Discussed high calcium  and vitamin D supplementation, weight bearing exercises  Cervical cancer screening: N/A  Skin cancer: Discussed monitoring for atypical lesions  Colorectal cancer: 11/23/16   Lung cancer:  Low Dose CT Chest recommended if Age 106-80 years, 20 pack-year currently smoking OR have quit w/in 15years. Patient does not qualify.   ECG: 10/16/16  Advanced Care Planning: A voluntary discussion about advance care planning including the explanation and discussion of advance directives.  Discussed health care proxy and Living will, and the patient was able to identify a health care proxy as Elizabeth Johnson - together for 26 years but not married   Lipids: Lab Results  Component Value Date   CHOL 188 05/05/2019   CHOL 255 (H) 09/10/2017   CHOL 238 (H) 10/16/2016   Lab Results  Component Value Date   HDL 66 05/05/2019   HDL 51 09/10/2017   HDL 54 10/16/2016   Lab Results  Component Value Date   LDLCALC 95 05/05/2019   LDLCALC 164 (H) 09/10/2017   LDLCALC 140 (H) 10/16/2016   Lab Results  Component Value Date   TRIG 173 (H) 05/05/2019   TRIG 238 (H) 09/10/2017   TRIG 284 (H) 10/16/2016   Lab Results  Component Value Date   CHOLHDL 2.8 05/05/2019   CHOLHDL 5.0 (H) 09/10/2017  CHOLHDL 4.4 10/16/2016   No results found for: LDLDIRECT  Glucose: Glucose, Bld  Date Value Ref Range Status  05/05/2019 130 (H) 65 - 99 mg/dL Final    Comment:    .            Fasting reference interval . For someone without known diabetes, a glucose value >125 mg/dL indicates that they may have diabetes and this should be confirmed with a follow-up test. .   09/10/2017 106 (H) 65 - 99 mg/dL Final    Comment:    .            Fasting reference interval . For someone without known diabetes, a glucose value between 100 and 125 mg/dL is consistent with prediabetes and should be confirmed with a follow-up test. .   04/09/2017 125 (H) 65 - 99 mg/dL Final   Glucose-Capillary  Date Value Ref Range  Status  04/15/2017 104 (H) 65 - 99 mg/dL Final    Patient Active Problem List   Diagnosis Date Noted   Angina pectoris syndrome ([place]) 04/29/2019   Trigger thumb of right hand 12/12/2017   Post-traumatic osteoarthritis of right wrist 12/12/2017   Biceps tendon rupture 09/21/2015   H/O hysterectomy for benign disease 09/02/2014   Acquired bilateral hammer toes 09/02/2014   External hemorrhoid 09/02/2014   Allergic rhinitis 07/19/2014   Carpal tunnel syndrome on right 07/19/2014   Insomnia, persistent 07/19/2014   Chronic LBP 07/19/2014   Mild major depression ([place]) 07/19/2014   Dyslipidemia 07/19/2014   Hemorrhoid 16/38/4536   Dysmetabolic syndrome 46/80/3212   Coronary artery disease involving left main coronary artery 07/19/2014   Osteopenia 07/19/2014   Benign essential HTN 02/04/2009   H/O acute myocardial infarction of lateral wall 07/18/2004    Past Surgical History:  Procedure Laterality Date   ABDOMINAL HYSTERECTOMY     BUNIONECTOMY Bilateral 1973   CARPAL TUNNEL RELEASE Right 04/15/2017   Procedure: CARPAL TUNNEL RELEASE;  Surgeon: Earnestine Leys, MD;  Location: [facility] ORS;  Service: Orthopedics;  Laterality: Right;   CATARACT EXTRACTION, BILATERAL  05/21 & 06/21   COLONOSCOPY WITH PROPOFOL N/A 11/23/2016   Procedure: COLONOSCOPY WITH PROPOFOL;  Surgeon: [name], MD;  Location: [facility] END[facility]COPY;  Service: Gastroenterology;  Laterality: N/A;   CORONARY STENT PLACEMENT  2009   ELBOW SURGERY Left 1977   HAMMER TOE SURGERY Right 1992   RIB FRACTURE SURGERY     WRIST FRACTURE SURGERY Right 58    Family History  Problem Relation Age of Onset   Alzheimer's disease Mother    Breast cancer Mother 53   CAD Father    Heart attack Father    Heart failure Father    COPD Father    Drug abuse Sister    Suicidality Sister    Heart attack Brother     Social History   Socioeconomic History   Marital status: Significant Other    Spouse name: Not on file   Number of  children: 1   Years of education: Not on file   Highest education level: Not on file  Occupational History   Occupation: retired   Tobacco Use   Smoking status: Former    Years: 8.00    Pack years: 0.00    Types: Cigarettes    Start date: 01/23/1999    Quit date: 05/23/2007    Years since quitting: 13.2   Smokeless tobacco: Never  Vaping Use   Vaping Use: Never used  Substance and Sexual Activity  Alcohol use: Yes    Alcohol/week: 0.0 standard drinks    Comment: occasional   Drug use: No   Sexual activity: Yes    Partners: Male  Other Topics Concern   Not on file  Social History Narrative   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 When her position was moved to [facility] with fiance of 25 years.    Social Determinants of Health   Financial Resource Strain: Low Risk    Difficulty of Paying Living Expenses: Not hard at all  Food Insecurity: No Food Insecurity   Worried About Charity fundraiser in the Last Year: Never true   [place] in the Last Year: Never true  Transportation Needs: No Transportation Needs   Lack of Transportation (Medical): No   Lack of Transportation (Non-Medical): No  Physical Activity: Insufficiently Active   Days of Exercise per Week: 3 days   Minutes of Exercise per Session: 30 min  Stress: No Stress Concern Present   Feeling of Stress : Only a little  Social Connections: Moderately Integrated   Frequency of Communication with Friends and Family: Twice a week   Frequency of Social Gatherings with Friends and Family: Three times a week   Attends Religious Services: Never   Active Member of Clubs or Organizations: Yes   Attends Archivist Meetings: 1 to 4 times per year   Marital Status: Living with partner  Intimate Partner Violence: Not At Risk   Fear of Current or Ex-Partner: No   Emotionally Abused: No   Physically Abused: No   Sexually Abused: No      Current Outpatient Medications:    ALPRAZolam (XANAX) 0.5 MG tablet, Take 1 tablet (0.5 mg total) by mouth daily as needed for anxiety., Disp: 30 tablet, Rfl: 0   aspirin 81 MG chewable tablet, Chew 81 mg by mouth daily. , Disp: , Rfl:    buPROPion (WELLBUTRIN XL) 300 MG 24 hr tablet, TAKE 1 TABLET BY MOUTH  DAILY, Disp: 90 tablet, Rfl: 1   carvedilol (COREG) 3.125 MG tablet, Take 1 tablet (3.125 mg total) by mouth 2 (two) times daily with a meal., Disp: 180 tablet, Rfl: 1   cholecalciferol (VITAMIN D3) 25 MCG (1000 UNIT) tablet, Take 1,000 Units by mouth daily., Disp: , Rfl:    cyclobenzaprine (FLEXERIL) 5 MG tablet, TAKE 1 TABLET BY MOUTH AT  BEDTIME, Disp: 90 tablet, Rfl: 1   ezetimibe (ZETIA) 10 MG tablet, TAKE 1 TABLET BY MOUTH  DAILY, Disp: 90 tablet, Rfl: 0   hydrOXYzine (ATARAX/VISTARIL) 10 MG tablet, TAKE 1 TO 2 TABLETS BY  MOUTH 3 TIMES DAILY AS  NEEDED, Disp: 90 tablet, Rfl: 0   lisinopril (ZESTRIL) 20 MG tablet, TAKE 1 TABLET BY MOUTH  DAILY, Disp: 90 tablet, Rfl: 0   nitroGLYCERIN (NITROSTAT) 0.4 MG SL tablet, DISSOLVE 1 TABLET UNDER THE TONGUE EVERY 5 MINUTES AS  NEEDED FOR CHEST PAIN. MAX  OF 3 TABLETS IN 15 MINUTES. CALL 911 IF PAIN PERSISTS., Disp: 100 tablet, Rfl: 3   rosuvastatin (CRESTOR) 20 MG tablet, TAKE 1 TABLET BY MOUTH  DAILY, Disp: 90 tablet, Rfl: 3  Allergies  Allergen Reactions   Codeine Other (See Comments)    Sensitivity. Nauseated      ROS  Constitutional: Negative for fever or weight change.  Respiratory: Negative for cough and shortness of breath.  Cardiovascular: Negative for chest pain or palpitations.  Gastrointestinal: Negative for abdominal pain, no bowel changes.  Musculoskeletal: Negative for gait problem or joint swelling.  Skin: Negative for rash.  Neurological: Negative for dizziness or headache.  No other specific complaints in a complete review of systems (except as listed in HPI above).   Objective  Vitals:   08/03/20 1054   BP: 112/78  Pulse: 65  Resp: 16  Temp: 98.2 F (36.8 C)  TempSrc: Oral  SpO2: 96%  Weight: 148 lb (67.1 kg)  Height: '5\' 2"'  (1.575 m)    Body mass index is 27.07 kg/m.  Physical Exam  Constitutional: Patient appears well-developed and well-nourished. No distress.  HENT: Head: Normocephalic and atraumatic. Ears: B TMs ok, no erythema or effusion; Nose: Nose normal. Mouth/Throat: not done  Eyes: Conjunctivae and EOM are normal. Pupils are equal, round, and reactive to light. No scleral icterus.  Neck: Normal range of motion. Neck supple. No JVD present. No thyromegaly present.  Cardiovascular: Normal rate, regular rhythm and normal heart sounds.  No murmur heard. No BLE edema. Pulmonary/Chest: Effort normal and breath sounds normal. No respiratory distress. Abdominal: Soft. Bowel sounds are normal, no distension. There is no tenderness. no masses Breast: lumpy breast,  no nipple discharge or rashes FEMALE GENITALIA:  Not done  RECTAL: not done  Musculoskeletal: Normal range of motion, no joint effusions. No gross deformities Neurological: he is alert and oriented to person, place, and time. No cranial nerve deficit. Coordination, balance, strength, speech and gait are normal.  Skin: Skin is warm and dry. No rash noted. No erythema.  Psychiatric: Patient has a normal mood and affect. behavior is normal. Judgment and thought content normal.   Fall Risk: Fall Risk  08/03/2020 03/10/2020 09/04/2019 07/14/2019 04/29/2019  Falls in the past year? 0 1 0 1 0  Number falls in past yr: 0 1 0 0 0  Injury with Fall? 0 0 0 0 0  Risk for fall due to : - - - No Fall Risks -  Follow up - - - Falls prevention discussed -     Functional Status Survey: Is the patient deaf or have difficulty hearing?: No Does the patient have difficulty seeing, even when wearing glasses/contacts?: No Does the patient have difficulty concentrating, remembering, or making decisions?: No Does the patient have  difficulty walking or climbing stairs?: No Does the patient have difficulty dressing or bathing?: No Does the patient have difficulty doing errands alone such as visiting a doctor's office or shopping?: No   Assessment & Plan  1. Well adult exam  - Lipid panel - COMPLETE METABOLIC PANEL WITH GFR - CBC with Differential/Platelet - Hemoglobin A1c - Vitamin B12  2. B12 deficiency  - Vitamin B12  3. Benign essential HTN  - COMPLETE METABOLIC PANEL WITH GFR - CBC with Differential/Platelet  4. Dyslipidemia  - Lipid panel  5. Breast cancer screening by mammogram   6. Hyperglycemia  - Hemoglobin A1c  7. Need for shingles vaccine  - Zoster Vaccine Adjuvanted [facility]) injection; Inject 0.5 mLs into the muscle once for 1 dose.  Dispense: 0.5 mL; Refill: 1    -USPSTF grade A and B recommendations reviewed with patient; age-appropriate recommendations, preventive care, screening tests, etc discussed and encouraged; healthy living encouraged; see AVS for patient education given to patient -Discussed importance of 150 minutes of physical activity weekly, eat two servings of fish weekly, eat one serving of tree nuts ( cashews, pistachios, pecans, almonds.Marland Kitchen) every  other day, eat 6 servings of fruit/vegetables daily and drink plenty of water and avoid sweet beverages.

## 2020-08-02 NOTE — Patient Instructions (Signed)
Preventive Care 69 Years and Older, Female Preventive care refers to lifestyle choices and visits with your health care provider that can promote health and wellness. This includes: A yearly physical exam. This is also called an annual wellness visit. Regular dental and eye exams. Immunizations. Screening for certain conditions. Healthy lifestyle choices, such as: Eating a healthy diet. Getting regular exercise. Not using drugs or products that contain nicotine and tobacco. Limiting alcohol use. What can I expect for my preventive care visit? Physical exam Your health care provider will check your: Height and weight. These may be used to calculate your BMI (body mass index). BMI is a measurement that tells if you are at a healthy weight. Heart rate and blood pressure. Body temperature. Skin for abnormal spots. Counseling Your health care provider may ask you questions about your: Past medical problems. Family's medical history. Alcohol, tobacco, and drug use. Emotional well-being. Home life and relationship well-being. Sexual activity. Diet, exercise, and sleep habits. History of falls. Memory and ability to understand (cognition). Work and work Statistician. Pregnancy and menstrual history. Access to firearms. What immunizations do I need?  Vaccines are usually given at various ages, according to a schedule. Your health care provider will recommend vaccines for you based on your age, medicalhistory, and lifestyle or other factors, such as travel or where you work. What tests do I need? Blood tests Lipid and cholesterol levels. These may be checked every 5 years, or more often depending on your overall health. Hepatitis C test. Hepatitis B test. Screening Lung cancer screening. You may have this screening every year starting at age 44 if you have a 30-pack-year history of smoking and currently smoke or have quit within the past 15 years. Colorectal cancer screening. All  adults should have this screening starting at age 39 and continuing until age 65. Your health care provider may recommend screening at age 61 if you are at increased risk. You will have tests every 1-10 years, depending on your results and the type of screening test. Diabetes screening. This is done by checking your blood sugar (glucose) after you have not eaten for a while (fasting). You may have this done every 1-3 years. Mammogram. This may be done every 1-2 years. Talk with your health care provider about how often you should have regular mammograms. Abdominal aortic aneurysm (AAA) screening. You may need this if you are a current or former smoker. BRCA-related cancer screening. This may be done if you have a family history of breast, ovarian, tubal, or peritoneal cancers. Other tests STD (sexually transmitted disease) testing, if you are at risk. Bone density scan. This is done to screen for osteoporosis. You may have this done starting at age 54. Talk with your health care provider about your test results, treatment options,and if necessary, the need for more tests. Follow these instructions at home: Eating and drinking  Eat a diet that includes fresh fruits and vegetables, whole grains, lean protein, and low-fat dairy products. Limit your intake of foods with high amounts of sugar, saturated fats, and salt. Take vitamin and mineral supplements as recommended by your health care provider. Do not drink alcohol if your health care provider tells you not to drink. If you drink alcohol: Limit how much you have to 0-1 drink a day. Be aware of how much alcohol is in your drink. In the [place] one drink equals one 12 oz bottle of beer (355 mL), one 5 oz glass of wine (148 mL), or one 1  oz glass of hard liquor (44 mL).  Lifestyle Take daily care of your teeth and gums. Brush your teeth every morning and night with fluoride toothpaste. Floss one time each day. Stay active. Exercise for at  least 30 minutes 5 or more days each week. Do not use any products that contain nicotine or tobacco, such as cigarettes, e-cigarettes, and chewing tobacco. If you need help quitting, ask your health care provider. Do not use drugs. If you are sexually active, practice safe sex. Use a condom or other form of protection in order to prevent STIs (sexually transmitted infections). Talk with your health care provider about taking a low-dose aspirin or statin. Find healthy ways to cope with stress, such as: Meditation, yoga, or listening to music. Journaling. Talking to a trusted person. Spending time with friends and family. Safety Always wear your seat belt while driving or riding in a vehicle. Do not drive: If you have been drinking alcohol. Do not ride with someone who has been drinking. When you are tired or distracted. While texting. Wear a helmet and other protective equipment during sports activities. If you have firearms in your house, make sure you follow all gun safety procedures. What's next? Visit your health care provider once a year for an annual wellness visit. Ask your health care provider how often you should have your eyes and teeth checked. Stay up to date on all vaccines. This information is not intended to replace advice given to you by your health care provider. Make sure you discuss any questions you have with your healthcare provider. Document Revised: 12/30/2019 Document Reviewed: 01/02/2018 Elsevier Patient Education  2022 [employer].

## 2020-08-03 ENCOUNTER — Ambulatory Visit (INDEPENDENT_AMBULATORY_CARE_PROVIDER_SITE_OTHER): Payer: Medicare Other | Admitting: Family Medicine

## 2020-08-03 ENCOUNTER — Other Ambulatory Visit: Payer: Self-pay

## 2020-08-03 ENCOUNTER — Encounter: Payer: Self-pay | Admitting: Family Medicine

## 2020-08-03 VITALS — BP 112/78 | HR 65 | Temp 98.2°F | Resp 16 | Ht 62.0 in | Wt 148.0 lb

## 2020-08-03 DIAGNOSIS — E785 Hyperlipidemia, unspecified: Secondary | ICD-10-CM | POA: Diagnosis not present

## 2020-08-03 DIAGNOSIS — Z0000 Encounter for general adult medical examination without abnormal findings: Secondary | ICD-10-CM

## 2020-08-03 DIAGNOSIS — E538 Deficiency of other specified B group vitamins: Secondary | ICD-10-CM | POA: Diagnosis not present

## 2020-08-03 DIAGNOSIS — R739 Hyperglycemia, unspecified: Secondary | ICD-10-CM

## 2020-08-03 DIAGNOSIS — I10 Essential (primary) hypertension: Secondary | ICD-10-CM

## 2020-08-03 DIAGNOSIS — Z23 Encounter for immunization: Secondary | ICD-10-CM

## 2020-08-03 DIAGNOSIS — Z1231 Encounter for screening mammogram for malignant neoplasm of breast: Secondary | ICD-10-CM

## 2020-08-03 MED ORDER — SHINGRIX 50 MCG/0.5ML IM SUSR: 0.5000 mL | Freq: Once | INTRAMUSCULAR | 1 refills | Status: AC

## 2020-08-04 LAB — CBC WITH DIFFERENTIAL/PLATELET
Absolute Monocytes: 800 cells/uL (ref 200–950)
Basophils Absolute: 84 cells/uL (ref 0–200)
Basophils Relative: 0.9 %
Eosinophils Absolute: 233 cells/uL (ref 15–500)
Eosinophils Relative: 2.5 %
HCT: 45.6 % — ABNORMAL HIGH (ref 35.0–45.0)
Hemoglobin: 15.1 g/dL (ref 11.7–15.5)
Lymphs Abs: 2306 cells/uL (ref 850–3900)
MCH: 29.3 pg (ref 27.0–33.0)
MCHC: 33.1 g/dL (ref 32.0–36.0)
MCV: 88.4 fL (ref 80.0–100.0)
MPV: 9 fL (ref 7.5–12.5)
Monocytes Relative: 8.6 %
Neutro Abs: 5878 cells/uL (ref 1500–7800)
Neutrophils Relative %: 63.2 %
Platelets: 320 10*3/uL (ref 140–400)
RBC: 5.16 10*6/uL — ABNORMAL HIGH (ref 3.80–5.10)
RDW: 12.7 % (ref 11.0–15.0)
Total Lymphocyte: 24.8 %
WBC: 9.3 10*3/uL (ref 3.8–10.8)

## 2020-08-04 LAB — LIPID PANEL
Cholesterol: 186 mg/dL (ref <200)
HDL: 68 mg/dL (ref >=50)
LDL Cholesterol (Calc): 90 mg/dL (calc)
Non-HDL Cholesterol (Calc): 118 mg/dL (calc) (ref <130)
Total CHOL/HDL Ratio: 2.7 (calc) (ref <5.0)
Triglycerides: 188 mg/dL — ABNORMAL HIGH (ref <150)

## 2020-08-04 LAB — HEMOGLOBIN A1C
Hgb A1c MFr Bld: 6.3 % of total Hgb — ABNORMAL HIGH (ref <5.7)
Mean Plasma Glucose: 134 mg/dL
eAG (mmol/L): 7.4 mmol/L

## 2020-08-04 LAB — COMPLETE METABOLIC PANEL WITH GFR
AG Ratio: 1.7 (calc) (ref 1.0–2.5)
ALT: 28 U/L (ref 6–29)
AST: 32 U/L (ref 10–35)
Albumin: 4.8 g/dL (ref 3.6–5.1)
Alkaline phosphatase (APISO): 76 U/L (ref 37–153)
BUN: 15 mg/dL (ref 7–25)
CO2: 29 mmol/L (ref 20–32)
Calcium: 10 mg/dL (ref 8.6–10.4)
Chloride: 104 mmol/L (ref 98–110)
Creat: 0.82 mg/dL (ref 0.50–1.05)
Globulin: 2.8 g/dL (calc) (ref 1.9–3.7)
Glucose, Bld: 119 mg/dL — ABNORMAL HIGH (ref 65–99)
Potassium: 4.8 mmol/L (ref 3.5–5.3)
Sodium: 142 mmol/L (ref 135–146)
Total Bilirubin: 0.6 mg/dL (ref 0.2–1.2)
Total Protein: 7.6 g/dL (ref 6.1–8.1)
eGFR: 78 mL/min/{1.73_m2} (ref >=60)

## 2020-08-04 LAB — VITAMIN B12: Vitamin B-12: 460 pg/mL (ref 200–1100)

## 2020-08-18 ENCOUNTER — Ambulatory Visit: Payer: Medicare Other

## 2020-09-01 ENCOUNTER — Other Ambulatory Visit: Payer: Self-pay | Admitting: Family Medicine

## 2020-09-01 DIAGNOSIS — E785 Hyperlipidemia, unspecified: Secondary | ICD-10-CM

## 2020-09-01 DIAGNOSIS — I2510 Atherosclerotic heart disease of native coronary artery without angina pectoris: Secondary | ICD-10-CM

## 2020-09-02 ENCOUNTER — Other Ambulatory Visit: Payer: Self-pay | Admitting: Family Medicine

## 2020-09-02 DIAGNOSIS — Z1231 Encounter for screening mammogram for malignant neoplasm of breast: Secondary | ICD-10-CM

## 2020-09-06 NOTE — Progress Notes (Signed)
Name: Elizabeth Johnson   MRN: [id number]    DOB: 01-02-1951   Date:09/08/2020       Progress Note  Subjective  Chief Complaint  Follow Up  HPI  Hyperlipidemia: she was on Pravastatin and we changed to Crestor, and she has noticed myalgia again, she is now on lower dose of Crestor, last LDL was 90 , but goal is 70 , she is now taking zetia but would like to hold off until next visit to recheck labs.    HTN: she is compliant with  medication and bp is at goal .  Currently no chest pain , palpitation or SOB.   Tinnitus bilaterally: she states going on for a few months, gradual onset, not severe but bothersome, denies hearing loss. She asked if any medication could cause it, explained aspirin in a big culprit , discussed changed to another medication but she prefers holding off for now    Major Depression: finally adjusting on not working, forced to retired back in 2017. She is taking Wellbutrin XL 300 mg daily, takes alrpazolam prn for sleep or anxiety, mind is busy at night and hard to relax, worries about her dogs. She states had to take alprazolam a little more often, we tried hydroxizine on her last visit but she said it does not work as well.    Back pain: she has mild low back pain,  Flexeril qhs prn only, stable . Described as spasms, no radiculitis. No pain at this time.    Insomnia: she takes alprazolam very seldom , states cannot sleep because mind is busy, hydroxizine did not work, discussed trying Temazepam    Angina pectoris: history of MI, denies recent chest pain, has NTG at home but expired and needs a refill , on beta-blocker, statin therapy and ACE, plus aspirin . Used to see Dr Clayborn Bigness but no recent visits. On medical management only Advised to go back to see him since MI was back in 2009 but she is not interested at this time. Denies decrease in exercise tolerance.    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, reassurance given, explained likely from aspirin and aging    Patient Active Problem List   Diagnosis Date Noted   Angina pectoris syndrome ([place]) 04/29/2019   Trigger thumb of right hand 12/12/2017   Post-traumatic osteoarthritis of right wrist 12/12/2017   Biceps tendon rupture 09/21/2015   H/O hysterectomy for benign disease 09/02/2014   Acquired bilateral hammer toes 09/02/2014   External hemorrhoid 09/02/2014   Allergic rhinitis 07/19/2014   Carpal tunnel syndrome on right 07/19/2014   Insomnia, persistent 07/19/2014   Chronic LBP 07/19/2014   Mild major depression ([place]) 07/19/2014   Dyslipidemia 07/19/2014   Hemorrhoid 82/50/0370   Dysmetabolic syndrome 48/88/9169   Coronary artery disease involving left main coronary artery 07/19/2014   Osteopenia 07/19/2014   Benign essential HTN 02/04/2009   H/O acute myocardial infarction of lateral wall 07/18/2004    Past Surgical History:  Procedure Laterality Date   ABDOMINAL HYSTERECTOMY     BUNIONECTOMY Bilateral 1973   CARPAL TUNNEL RELEASE Right 04/15/2017   Procedure: CARPAL TUNNEL RELEASE;  Surgeon: Earnestine Leys, MD;  Location: [facility];  Service: Orthopedics;  Laterality: Right;   CATARACT EXTRACTION, BILATERAL  05/21 & 06/21   COLONOSCOPY WITH PROPOFOL N/A 11/23/2016   Procedure: COLONOSCOPY WITH PROPOFOL;  Surgeon: [name], MD;  Location: [facility];  Service: Gastroenterology;  Laterality: N/A;   CORONARY STENT PLACEMENT  2009   ELBOW SURGERY Left 1977   HAMMER TOE SURGERY Right 1992   RIB FRACTURE SURGERY     WRIST FRACTURE SURGERY Right 84    Family History  Problem Relation Age of Onset   Alzheimer's disease Mother    Breast cancer Mother 48   CAD Father    Heart attack Father    Heart failure Father    COPD Father    Drug abuse Sister    Suicidality Sister    Heart attack Brother     Social History   Tobacco Use   Smoking status: Former    Packs/day: 0.25     Years: 8.00    Pack years: 2.00    Types: Cigarettes    Start date: 01/23/1999    Quit date: 05/23/2007    Years since quitting: 13.3   Smokeless tobacco: Never  Substance Use Topics   Alcohol use: Yes    Alcohol/week: 0.0 standard drinks    Comment: occasional     Current Outpatient Medications:    ALPRAZolam (XANAX) 0.5 MG tablet, Take 1 tablet (0.5 mg total) by mouth daily as needed for anxiety., Disp: 30 tablet, Rfl: 0   aspirin 81 MG chewable tablet, Chew 81 mg by mouth daily. , Disp: , Rfl:    buPROPion (WELLBUTRIN XL) 300 MG 24 hr tablet, TAKE 1 TABLET BY MOUTH  DAILY, Disp: 90 tablet, Rfl: 1   carvedilol (COREG) 3.125 MG tablet, Take 1 tablet (3.125 mg total) by mouth 2 (two) times daily with a meal., Disp: 180 tablet, Rfl: 1   cholecalciferol (VITAMIN D3) 25 MCG (1000 UNIT) tablet, Take 1,000 Units by mouth daily., Disp: , Rfl:    cyclobenzaprine (FLEXERIL) 5 MG tablet, TAKE 1 TABLET BY MOUTH AT  BEDTIME, Disp: 90 tablet, Rfl: 1   ezetimibe (ZETIA) 10 MG tablet, TAKE 1 TABLET BY MOUTH  DAILY, Disp: 90 tablet, Rfl: 3   hydrOXYzine (ATARAX/VISTARIL) 10 MG tablet, TAKE 1 TO 2 TABLETS BY  MOUTH 3 TIMES DAILY AS  NEEDED, Disp: 90 tablet, Rfl: 0   lisinopril (ZESTRIL) 20 MG tablet, TAKE 1 TABLET BY MOUTH  DAILY, Disp: 90 tablet, Rfl: 0   nitroGLYCERIN (NITROSTAT) 0.4 MG SL tablet, DISSOLVE 1 TABLET UNDER THE TONGUE EVERY 5 MINUTES AS  NEEDED FOR CHEST PAIN. MAX  OF 3 TABLETS IN 15 MINUTES. CALL 911 IF PAIN PERSISTS., Disp: 100 tablet, Rfl: 3   rosuvastatin (CRESTOR) 20 MG tablet, TAKE 1 TABLET BY MOUTH  DAILY, Disp: 90 tablet, Rfl: 3  Allergies  Allergen Reactions   Codeine Other (See Comments)    Sensitivity. Nauseated     I personally reviewed active problem list, medication list, allergies, family history, social history, health maintenance with the patient/caregiver today.   ROS  Constitutional: Negative for fever or weight change.  Respiratory: Negative for cough and  shortness of breath.   Cardiovascular: Negative for chest pain or palpitations.  Gastrointestinal: Negative for abdominal pain, no bowel changes.  Musculoskeletal: Negative for gait problem or joint swelling.  Skin: Negative for rash.  Neurological: Negative for dizziness or headache.  No other specific complaints in a complete review of systems (except as listed in HPI above).   Objective  Vitals:   09/08/20 0957  BP: 126/70  Pulse: 87  Resp: 16  Temp: 97.9 F (36.6 C)  SpO2: 96%  Weight: 147 lb (66.7 kg)  Height: _0  (1.575 m)    Body mass index is 26.89 kg/m.  Physical Exam  Constitutional: Patient appears well-developed and well-nourished. Overweight.  No distress.  HEENT: head atraumatic, normocephalic, pupils equal and reactive to light, ears normal TM, neck supple, throat within normal limits Cardiovascular: Normal rate, regular rhythm and normal heart sounds.  No murmur heard. No BLE edema. Pulmonary/Chest: Effort normal and breath sounds normal. No respiratory distress. Abdominal: Soft.  There is no tenderness. Psychiatric: Patient has a normal mood and affect. behavior is normal. Judgment and thought content normal.  Skin: senile purpura on left arm  Recent Results (from the past 2160 hour(s))  Lipid panel     Status: Abnormal   Collection Time: 08/03/20 11:51 AM  Result Value Ref Range   Cholesterol 186 <200 mg/dL   HDL 68 > OR = 50 mg/dL   Triglycerides 188 (H) <150 mg/dL   LDL Cholesterol (Calc) 90 mg/dL (calc)    Comment: Reference range: <100 . Desirable range <100 mg/dL for primary prevention;   <70 mg/dL for patients with CHD or diabetic patients  with > or = 2 CHD risk factors. Marland Kitchen LDL-C is now calculated using the Martin-Hopkins  calculation, which is a validated novel method providing  better accuracy than the Friedewald equation in the  estimation of LDL-C.  Cresenciano Genre et al. Annamaria Helling. 8676;195(09): 2061-2068   (http://education.[facility].com/faq/FAQ164)    Total CHOL/HDL Ratio 2.7 <5.0 (calc)   Non-HDL Cholesterol (Calc) 118 <130 mg/dL (calc)    Comment: For patients with diabetes plus 1 major ASCVD risk  factor, treating to a non-HDL-C goal of <100 mg/dL  (LDL-C of <70 mg/dL) is considered a therapeutic  option.   COMPLETE METABOLIC PANEL WITH GFR     Status: Abnormal   Collection Time: 08/03/20 11:51 AM  Result Value Ref Range   Glucose, Bld 119 (H) 65 - 99 mg/dL    Comment: .            Fasting reference interval . For someone without known diabetes, a glucose value between 100 and 125 mg/dL is consistent with prediabetes and should be confirmed with a follow-up test. .    BUN 15 7 - 25 mg/dL   Creat 0.82 0.50 - 1.05 mg/dL   eGFR 78 > OR = 60 mL/min/1.32m    Comment: The eGFR is based on the CKD-EPI 2021 equation. To calculate  the new eGFR from a previous Creatinine or Cystatin C result, go to https://www.kidney.org/professionals/ kdoqi/gfr%5Fcalculator    BUN/Creatinine Ratio NOT APPLICABLE 6 - 22 (calc)   Sodium 142 135 - 146 mmol/L   Potassium 4.8 3.5 - 5.3 mmol/L   Chloride 104 98 - 110 mmol/L   CO2 29 20 - 32 mmol/L   Calcium 10.0 8.6 - 10.4 mg/dL   Total Protein 7.6 6.1 - 8.1 g/dL   Albumin 4.8 3.6 - 5.1 g/dL   Globulin 2.8 1.9 - 3.7 g/dL (calc)   AG Ratio 1.7 1.0 - 2.5 (calc)   Total Bilirubin 0.6 0.2 - 1.2 mg/dL   Alkaline phosphatase (APISO) 76 37 - 153 U/L   AST 32 10 - 35 U/L   ALT 28 6 - 29 U/L  CBC with Differential/Platelet     Status: Abnormal   Collection Time: 08/03/20 11:51 AM  Result Value Ref Range   WBC 9.3 3.8 - 10.8 Thousand/uL   RBC 5.16 (H) 3.80 - 5.10 Million/uL   Hemoglobin 15.1 11.7 - 15.5 g/dL   HCT  45.6 (H) 35.0 - 45.0 %   MCV 88.4 80.0 - 100.0 fL   MCH 29.3 27.0 - 33.0 pg   MCHC 33.1 32.0 - 36.0 g/dL   RDW 12.7 11.0 - 15.0 %   Platelets 320 140 - 400 Thousand/uL   MPV 9.0 7.5 - 12.5 fL   Neutro Abs 5,878 1,500 - 7,800  cells/uL   Lymphs Abs 2,306 850 - 3,900 cells/uL   Absolute Monocytes 800 200 - 950 cells/uL   Eosinophils Absolute 233 15 - 500 cells/uL   Basophils Absolute 84 0 - 200 cells/uL   Neutrophils Relative % 63.2 %   Total Lymphocyte 24.8 %   Monocytes Relative 8.6 %   Eosinophils Relative 2.5 %   Basophils Relative 0.9 %  Hemoglobin A1c     Status: Abnormal   Collection Time: 08/03/20 11:51 AM  Result Value Ref Range   Hgb A1c MFr Bld 6.3 (H) <5.7 % of total Hgb    Comment: For someone without known diabetes, a hemoglobin  A1c value between 5.7% and 6.4% is consistent with prediabetes and should be confirmed with a  follow-up test. . For someone with known diabetes, a value <7% indicates that their diabetes is well controlled. A1c targets should be individualized based on duration of diabetes, age, comorbid conditions, and other considerations. . This assay result is consistent with an increased risk of diabetes. . Currently, no consensus exists regarding use of hemoglobin A1c for diagnosis of diabetes for children. .    Mean Plasma Glucose 134 mg/dL   eAG (mmol/L) 7.4 mmol/L  Vitamin B12     Status: None   Collection Time: 08/03/20 11:51 AM  Result Value Ref Range   Vitamin B-12 460 200 - 1,100 pg/mL     PHQ2/9: Depression screen [facility] 2/9 09/08/2020 08/03/2020 03/10/2020 09/04/2019 07/14/2019  Decreased Interest 0 0 0 0 0  Down, Depressed, Hopeless 0 1 0 0 0  PHQ - 2 Score 0 1 0 0 0  Altered sleeping 0 1 - 0 -  Tired, decreased energy 0 0 - 0 -  Change in appetite 0 0 - 0 -  Feeling bad or failure about yourself  0 0 - 0 -  Trouble concentrating 0 0 - 0 -  Moving slowly or fidgety/restless 0 0 - 0 -  Suicidal thoughts 0 0 - 0 -  PHQ-9 Score 0 2 - 0 -  Difficult doing work/chores - - - - -  Some recent data might be hidden    phq 9 is negative   Fall Risk: Fall Risk  09/08/2020 08/03/2020 03/10/2020 09/04/2019 07/14/2019  Falls in the past year? 0 0 1 0 1  Number  falls in past yr: 0 0 1 0 0  Injury with Fall? 0 0 0 0 0  Risk for fall due to : No Fall Risks - - - No Fall Risks  Follow up Falls prevention discussed - - - Falls prevention discussed     Functional Status Survey: Is the patient deaf or have difficulty hearing?: No Does the patient have difficulty seeing, even when wearing glasses/contacts?: No Does the patient have difficulty concentrating, remembering, or making decisions?: No Does the patient have difficulty walking or climbing stairs?: No Does the patient have difficulty dressing or bathing?: No Does the patient have difficulty doing errands alone such as visiting a doctor's office or shopping?: No    Assessment & Plan  1. Benign essential HTN  - carvedilol (COREG) 3.125  MG tablet; Take 1 tablet (3.125 mg total) by mouth 2 (two) times daily with a meal.  Dispense: 180 tablet; Refill: 1 - lisinopril (ZESTRIL) 20 MG tablet; Take 1 tablet (20 mg total) by mouth daily.  Dispense: 90 tablet; Refill: 1  2. Dyslipidemia   3. B12 deficiency   4. Hyperglycemia   5. Coronary artery disease involving left main coronary artery  - carvedilol (COREG) 3.125 MG tablet; Take 1 tablet (3.125 mg total) by mouth 2 (two) times daily with a meal.  Dispense: 180 tablet; Refill: 1 - lisinopril (ZESTRIL) 20 MG tablet; Take 1 tablet (20 mg total) by mouth daily.  Dispense: 90 tablet; Refill: 1  6. Intermittent low back pain   7. Angina pectoris syndrome ([place])   8. Other insomnia  - temazepam (RESTORIL) 15 MG capsule; Take 1 capsule (15 mg total) by mouth at bedtime as needed for sleep.  Dispense: 90 capsule; Refill: 0  9. Major depression in remission ([place])   10. Dysmetabolic syndrome   11. Senile purpura ([place])

## 2020-09-08 ENCOUNTER — Other Ambulatory Visit: Payer: Self-pay

## 2020-09-08 ENCOUNTER — Encounter: Payer: Self-pay | Admitting: Family Medicine

## 2020-09-08 ENCOUNTER — Ambulatory Visit (INDEPENDENT_AMBULATORY_CARE_PROVIDER_SITE_OTHER): Payer: Medicare Other | Admitting: Family Medicine

## 2020-09-08 VITALS — BP 126/70 | HR 87 | Temp 97.9°F | Resp 16 | Ht 62.0 in | Wt 147.0 lb

## 2020-09-08 DIAGNOSIS — M545 Low back pain, unspecified: Secondary | ICD-10-CM

## 2020-09-08 DIAGNOSIS — I2510 Atherosclerotic heart disease of native coronary artery without angina pectoris: Secondary | ICD-10-CM

## 2020-09-08 DIAGNOSIS — E785 Hyperlipidemia, unspecified: Secondary | ICD-10-CM | POA: Diagnosis not present

## 2020-09-08 DIAGNOSIS — F325 Major depressive disorder, single episode, in full remission: Secondary | ICD-10-CM

## 2020-09-08 DIAGNOSIS — I10 Essential (primary) hypertension: Secondary | ICD-10-CM

## 2020-09-08 DIAGNOSIS — G4709 Other insomnia: Secondary | ICD-10-CM

## 2020-09-08 DIAGNOSIS — D692 Other nonthrombocytopenic purpura: Secondary | ICD-10-CM | POA: Insufficient documentation

## 2020-09-08 DIAGNOSIS — E538 Deficiency of other specified B group vitamins: Secondary | ICD-10-CM | POA: Diagnosis not present

## 2020-09-08 DIAGNOSIS — E8881 Metabolic syndrome: Secondary | ICD-10-CM

## 2020-09-08 DIAGNOSIS — R739 Hyperglycemia, unspecified: Secondary | ICD-10-CM | POA: Diagnosis not present

## 2020-09-08 DIAGNOSIS — I209 Angina pectoris, unspecified: Secondary | ICD-10-CM

## 2020-09-08 MED ORDER — CARVEDILOL 3.125 MG PO TABS: 3.1250 mg | ORAL_TABLET | Freq: Two times a day (BID) | ORAL | 1 refills | Status: DC

## 2020-09-08 MED ORDER — LISINOPRIL 20 MG PO TABS: 20.0000 mg | ORAL_TABLET | Freq: Every day | ORAL | 1 refills | Status: DC

## 2020-09-08 MED ORDER — TEMAZEPAM 15 MG PO CAPS: 15.0000 mg | ORAL_CAPSULE | Freq: Every evening | ORAL | 0 refills | Status: DC | PRN

## 2020-09-09 ENCOUNTER — Ambulatory Visit: Payer: Medicare Other | Admitting: Family Medicine

## 2020-11-05 IMAGING — MG DIGITAL SCREENING BILATERAL MAMMOGRAM WITH TOMO AND CAD
8 series · 8 of 24 positions shown · non-contrast
Comparison: Previous exam(s).

CLINICAL DATA: Screening.

EXAM:
DIGITAL SCREENING BILATERAL MAMMOGRAM WITH TOMO AND CAD

[L MLO synth-2D]
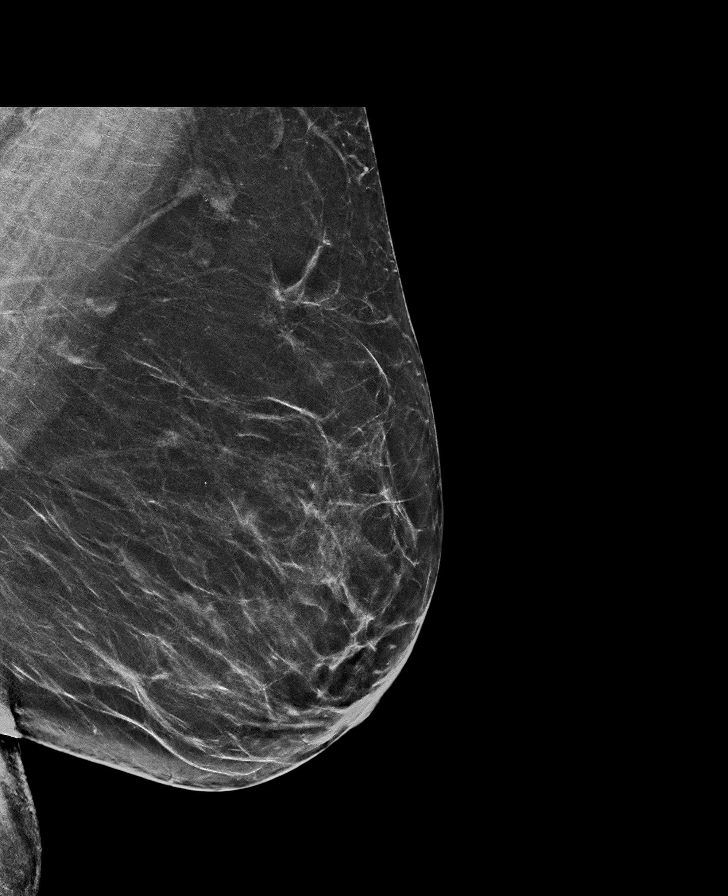

[R CC synth-2D]
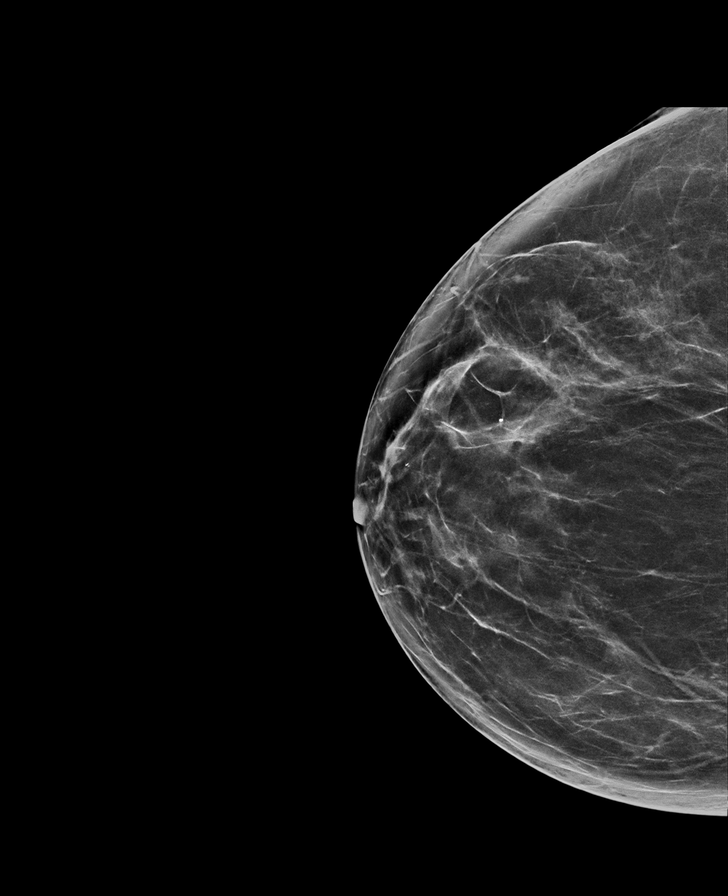

[R MLO synth-2D]
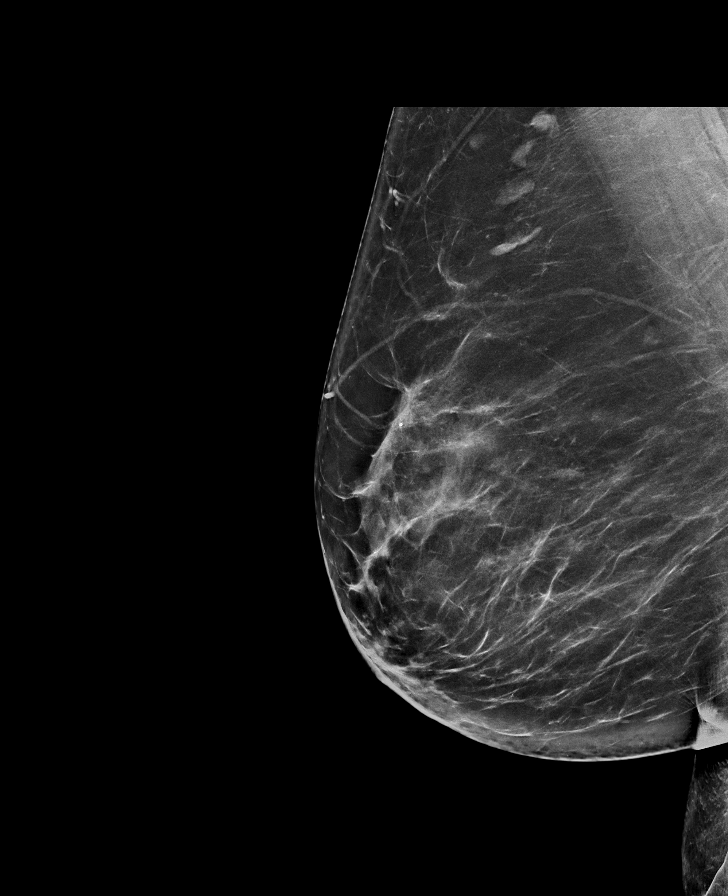

[L CC synth-2D]
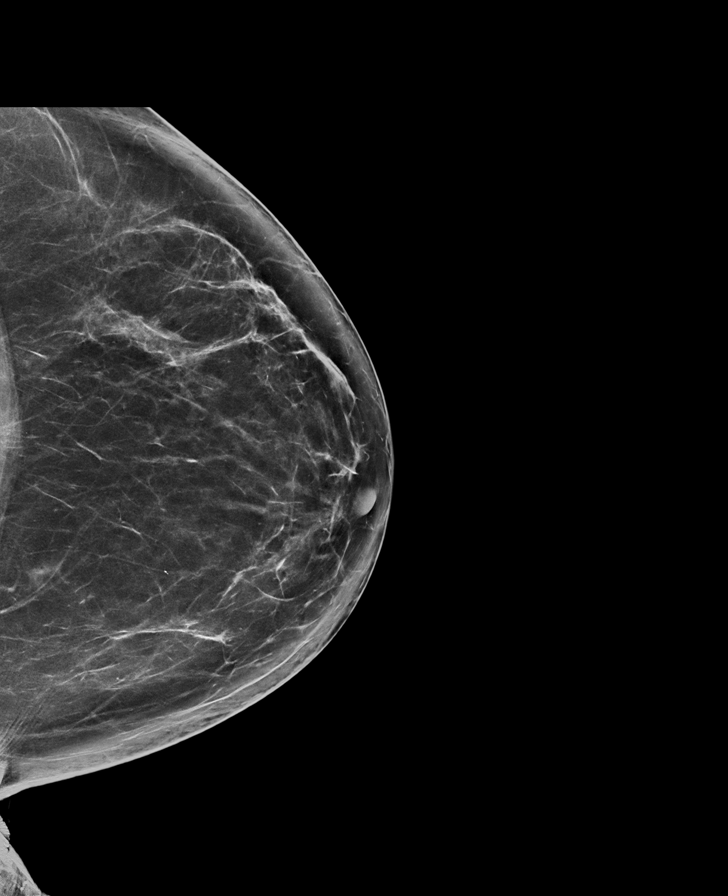

[R CC tomo · tomo slice 39/77.0]
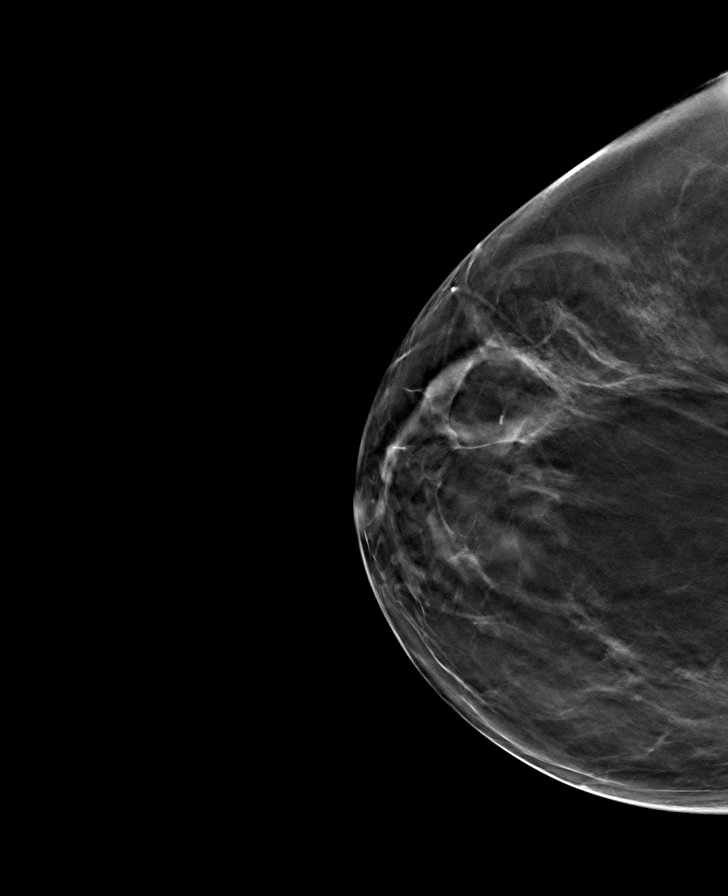

[L MLO tomo · tomo slice 42/83.0]
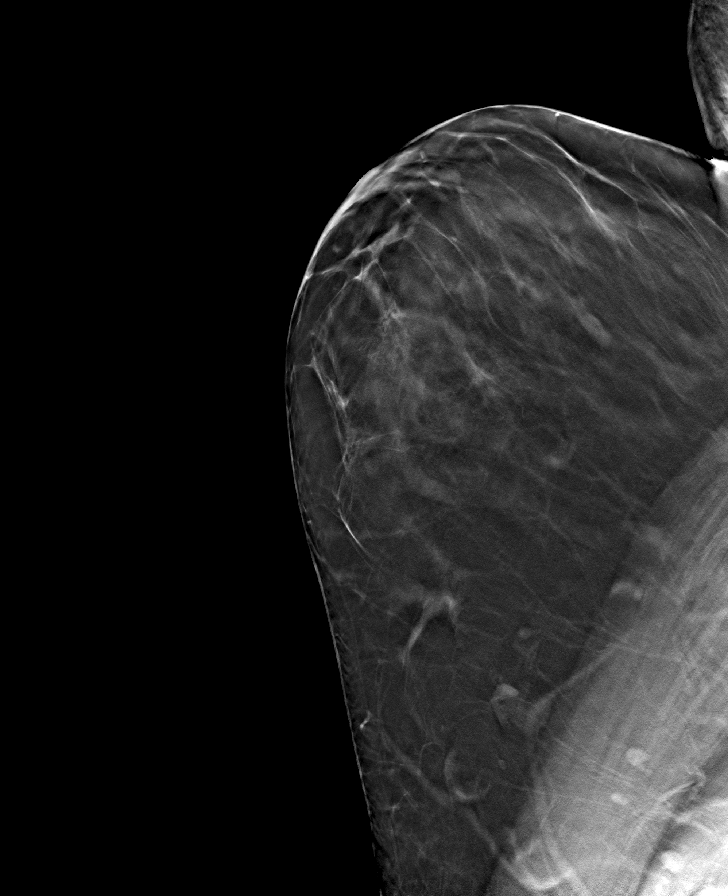

[L CC tomo · tomo slice 43/85.0]
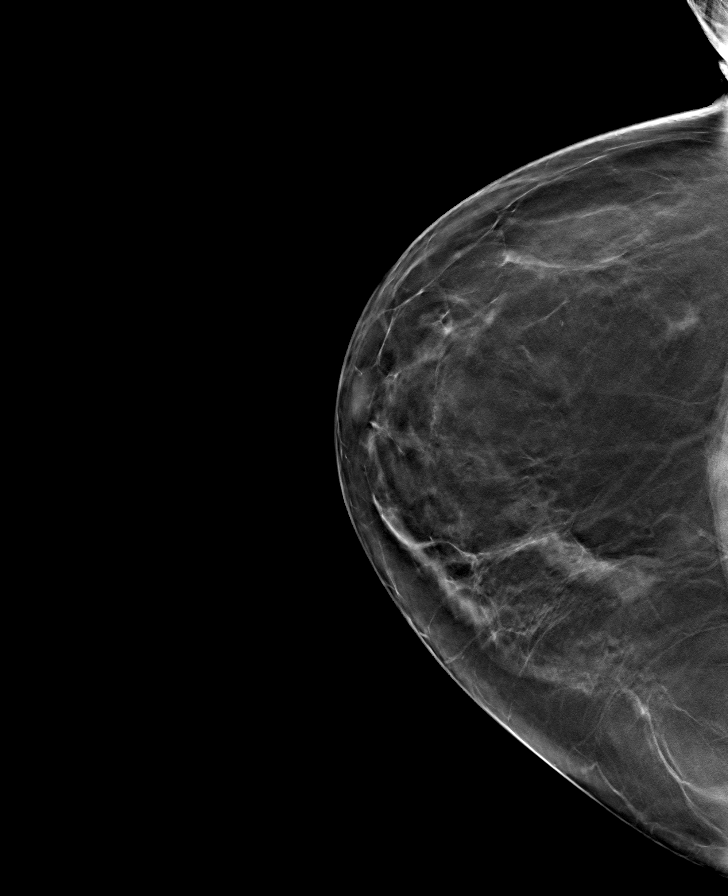

[R MLO tomo · tomo slice 45/89.0]
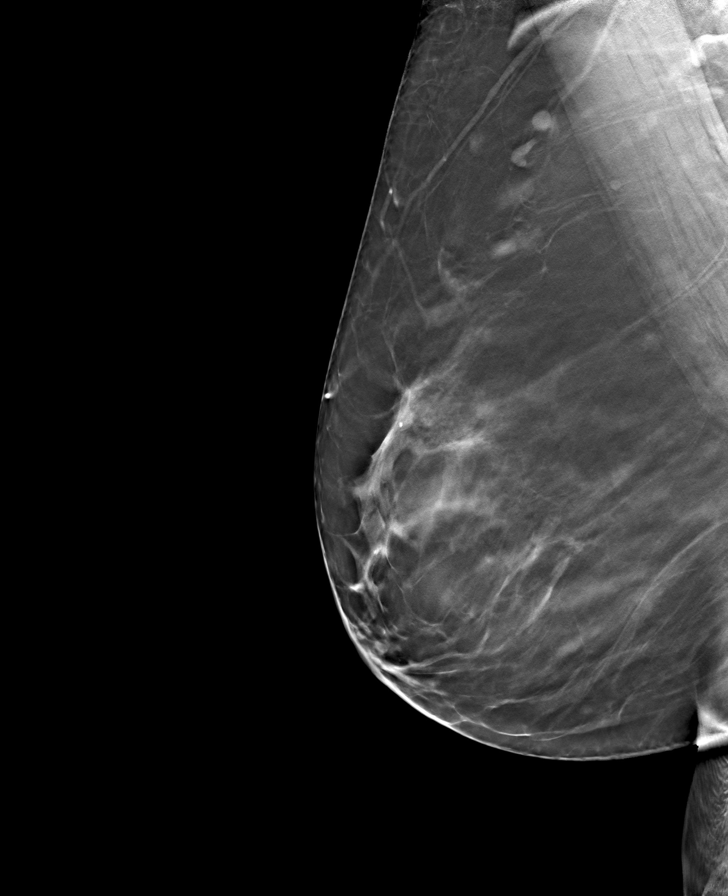

[8 of 24 positions shown; findings below may reference images not displayed]

ACR Breast Density Category b: There are scattered areas of
fibroglandular density.
FINDINGS: There are no findings suspicious for malignancy. Images were
processed with CAD.
IMPRESSION: No mammographic evidence of malignancy. A result letter of this
screening mammogram will be mailed directly to the patient.

RECOMMENDATION:
Screening mammogram in one year. (Code:[id number])

BI-RADS CATEGORY  1: Negative.

## 2020-11-18 ENCOUNTER — Other Ambulatory Visit: Payer: Self-pay | Admitting: Family Medicine

## 2020-11-18 DIAGNOSIS — G4709 Other insomnia: Secondary | ICD-10-CM

## 2020-11-19 NOTE — Telephone Encounter (Signed)
Requested medication (s) are due for refill today:yes  Requested medication (s) are on the active medication list: yes  Last refill: 03/10/20  #30  0 refills  Future visit scheduled yes  03/14/21 / Notes to clinic  Not delegated/ Requested Prescriptions  Pending Prescriptions Disp Refills   ALPRAZolam (XANAX) 0.5 MG tablet [Pharmacy Med Name: ALPRAZolam 0.5 MG Oral Tablet] 30 tablet     Sig: TAKE 1 TABLET BY MOUTH  DAILY AS NEEDED FOR ANXIETY     Not Delegated - Psychiatry:  Anxiolytics/Hypnotics Failed - 11/18/2020  8:17 PM      Failed - This refill cannot be delegated      Failed - Urine Drug Screen completed in last 360 days      Passed - Valid encounter within last 6 months    Recent Outpatient Visits           2 months ago Benign essential HTN   [facility] Steele Sizer, MD   3 months ago Well adult exam   [facility] Steele Sizer, MD   8 months ago B12 deficiency   [facility] Steele Sizer, MD   1 year ago Benign essential HTN   [facility] Steele Sizer, MD   1 year ago Dyslipidemia   [facility] Steele Sizer, MD       Future Appointments             In 3 months Ancil Boozer, Drue Stager, MD [facility]

## 2020-11-21 ENCOUNTER — Other Ambulatory Visit: Payer: Self-pay

## 2020-11-22 NOTE — Telephone Encounter (Signed)
Last seen 8/18

## 2020-11-24 ENCOUNTER — Other Ambulatory Visit: Payer: Self-pay | Admitting: Family Medicine

## 2020-11-24 DIAGNOSIS — I2510 Atherosclerotic heart disease of native coronary artery without angina pectoris: Secondary | ICD-10-CM

## 2020-11-24 DIAGNOSIS — G4709 Other insomnia: Secondary | ICD-10-CM

## 2020-11-24 DIAGNOSIS — E785 Hyperlipidemia, unspecified: Secondary | ICD-10-CM

## 2020-12-28 ENCOUNTER — Other Ambulatory Visit: Payer: Self-pay | Admitting: Family Medicine

## 2020-12-28 DIAGNOSIS — F320 Major depressive disorder, single episode, mild: Secondary | ICD-10-CM

## 2020-12-28 NOTE — Telephone Encounter (Signed)
Requested Prescriptions  Pending Prescriptions Disp Refills  . buPROPion (WELLBUTRIN XL) 300 MG 24 hr tablet [Pharmacy Med Name: buPROPion HCl ER (XL) 300 MG Oral Tablet Extended Release 24 Hour] 90 tablet 0    Sig: TAKE 1 TABLET BY MOUTH  DAILY     Psychiatry: Antidepressants - bupropion Passed - 12/28/2020 10:58 PM      Passed - Completed PHQ-2 or PHQ-9 in the last 360 days      Passed - Last BP in normal range    BP Readings from Last 1 Encounters:  09/08/20 126/70         Passed - Valid encounter within last 6 months    Recent Outpatient Visits          3 months ago Benign essential HTN   [facility] Steele Sizer, MD   4 months ago Well adult exam   [facility] Steele Sizer, MD   9 months ago B12 deficiency   [facility] Steele Sizer, MD   1 year ago Benign essential HTN   [facility] Steele Sizer, MD   1 year ago Dyslipidemia   [facility] Steele Sizer, MD      Future Appointments            In 2 months Ancil Boozer, Drue Stager, MD [facility]

## 2021-03-14 ENCOUNTER — Ambulatory Visit: Payer: Medicare Other | Admitting: Family Medicine

## 2021-04-03 ENCOUNTER — Other Ambulatory Visit: Payer: Self-pay

## 2021-04-03 DIAGNOSIS — M545 Low back pain, unspecified: Secondary | ICD-10-CM

## 2021-04-03 DIAGNOSIS — G4709 Other insomnia: Secondary | ICD-10-CM

## 2021-04-03 DIAGNOSIS — I10 Essential (primary) hypertension: Secondary | ICD-10-CM

## 2021-04-03 DIAGNOSIS — I2510 Atherosclerotic heart disease of native coronary artery without angina pectoris: Secondary | ICD-10-CM

## 2021-04-03 DIAGNOSIS — F320 Major depressive disorder, single episode, mild: Secondary | ICD-10-CM

## 2021-04-03 DIAGNOSIS — E785 Hyperlipidemia, unspecified: Secondary | ICD-10-CM

## 2021-04-03 MED ORDER — CYCLOBENZAPRINE HCL 5 MG PO TABS: 5.0000 mg | ORAL_TABLET | Freq: Every day | ORAL | 0 refills | Status: DC

## 2021-04-03 MED ORDER — LISINOPRIL 20 MG PO TABS: 20.0000 mg | ORAL_TABLET | Freq: Every day | ORAL | 0 refills | Status: DC

## 2021-04-03 MED ORDER — ROSUVASTATIN CALCIUM 20 MG PO TABS: 20.0000 mg | ORAL_TABLET | Freq: Every day | ORAL | 0 refills | Status: DC

## 2021-04-03 MED ORDER — HYDROXYZINE HCL 10 MG PO TABS: ORAL_TABLET | ORAL | 0 refills | Status: DC

## 2021-04-03 MED ORDER — BUPROPION HCL ER (XL) 300 MG PO TB24: 300.0000 mg | ORAL_TABLET | Freq: Every day | ORAL | 0 refills | Status: DC

## 2021-04-03 MED ORDER — CARVEDILOL 3.125 MG PO TABS: 3.1250 mg | ORAL_TABLET | Freq: Two times a day (BID) | ORAL | 0 refills | Status: DC

## 2021-04-07 ENCOUNTER — Telehealth: Payer: Self-pay

## 2021-04-07 NOTE — Telephone Encounter (Signed)
Spoke with patient and informed her that we have not received a call from her insurance [employer]. I also informed patient that she can have her Well Visit with her insurance company.  Pt verbalized understanding and will reach back out to [facility].

## 2021-04-07 NOTE — Telephone Encounter (Signed)
Copied from [place] [id number]. Topic: General - Other ?>> Apr 06, 2021 12:21 PM Tessa Lerner A wrote: ?Reason for CRM: The patient has been in contact with their insurance provider [place]  ? ?The patient was told that [employer] contacted the practice on 02/23/21 to request approval for a Well Visit for the patient  ? ?The request for the Well Visit was denied by a member of staff and the patient would like to discuss the reason this offer from their insurance provider was was declined  ? ?Please contact further when possible ?

## 2021-04-10 NOTE — Progress Notes (Signed)
Name: Elizabeth Johnson   MRN: [id number]    DOB: 1950-02-03   Date:04/11/2021 ? ?     Progress Note ? ?Subjective ? ?Chief Complaint ? ?Follow Up ? ?HPI ? ?Hyperlipidemia: she was on Pravastatin and we changed to Crestor, and she has noticed myalgia again, she is now on lower dose of Crestor, last LDL was 90 , but goal is 70 . She is on Zetia but does not want to check labs today, no side effects, but too costly.  ?  ?HTN: she is compliant with  medication and bp is at goal .  Currently no chest pain , palpitation or SOB. Unchanged  ? ?Tinnitus bilaterally: she states going on for many months gradual onset, not severe but bothersome, denies hearing loss. She asked if any medication could cause it, explained aspirin can be the cause discussed changed to another medication, but she is not ready. Discussed referral to ENT but she states not ready at this time  ?  ?Major Depression:  She is taking Wellbutrin XL 300 mg daily, takes alrpazolam prn for sleep or anxiety, mind is busy at night and hard to relax, still grieving the death of one of her dogs - died 2020-10-18, Westly Pam is still alive but also very sick and has dementia.  She takes hydroxizine prn and very seldom takes alprazolam  ?  ?Back pain: she has mild low back pain at this time but sometimes has a flare and the pain can be intense but never has radiculitis.   Flexeril qhs prn only, stable . Described as spasms, no radiculitis.  ?  ?Insomnia: she takes alprazolam very seldom , states cannot sleep because mind is busy, hydroxizine did not work, discussed trying Temazepam  ?  ?Angina pectoris: history of MI, denies recent chest pain,on beta-blocker, statin therapy recently also on Zetia ( too costly and we gave her GoodRx voucher)  and ACE, plus aspirin . Used to see Dr Clayborn Bigness but she states released from his care for now.  On medical management  ?  ?Hyperglycemia: denies polyphagia, polydipsia or polyuria, last A1C was 6.3 %, reminded her of low sugar diet   ? 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  ?  ?Patient Active Problem List  ? Diagnosis Date Noted  ? 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 Angina pectoris syndrome ([place]) 04/29/2019  ? Trigger thumb of right hand 12/12/2017  ? Post-traumatic osteoarthritis of right wrist 12/12/2017  ? Biceps tendon rupture 09/21/2015  ? H/O hysterectomy for benign disease 09/02/2014  ? Acquired bilateral hammer toes 09/02/2014  ? External hemorrhoid 09/02/2014  ? Allergic rhinitis 07/19/2014  ? Carpal tunnel syndrome on right 07/19/2014  ? Insomnia, persistent 07/19/2014  ? Chronic LBP 07/19/2014  ? Mild major depression ([place]) 07/19/2014  ? Dyslipidemia 07/19/2014  ? Hemorrhoid 07/19/2014  ? Dysmetabolic syndrome 97/98/9211  ? Coronary artery disease involving left main coronary artery 07/19/2014  ? Osteopenia 07/19/2014  ? Benign essential HTN 02/04/2009  ? H/O acute myocardial infarction of lateral wall 07/18/2004  ? ? ?Past Surgical History:  ?Procedure Laterality Date  ? ABDOMINAL HYSTERECTOMY    ? BUNIONECTOMY Bilateral 1973  ? CARPAL TUNNEL RELEASE Right 04/15/2017  ? Procedure: CARPAL TUNNEL RELEASE;  Surgeon: Earnestine Leys, MD;  Location: [facility] ORS;  Service: Orthopedics;  Laterality: Right;  ? CATARACT EXTRACTION, BILATERAL  05/21 & 06/21  ? COLONOSCOPY WITH PROPOFOL N/A 11/23/2016  ? Procedure: COLONOSCOPY WITH PROPOFOL;  Surgeon: [name], MD;  Location: [facility]  ENDOSCOPY;  Service: Gastroenterology;  Laterality: N/A;  ? CORONARY STENT PLACEMENT  2009  ? ELBOW SURGERY Left 1977  ? HAMMER TOE SURGERY Right 1992  ? RIB FRACTURE SURGERY    ? WRIST FRACTURE SURGERY Right 1977  ? ? ?Family History  ?Problem Relation Age of Onset  ? Alzheimer's disease Mother   ? Breast cancer Mother 31  ? CAD Father   ? Heart attack Father   ? Heart failure Father   ? COPD Father   ? Drug abuse Sister   ? Suicidality Sister   ? Heart attack Brother   ? ? ?Social History  ? ?Tobacco Use  ? Smoking status: Former  ?  Packs/day: 0.25  ?   Years: 8.00  ?  Pack years: 2.00  ?  Types: Cigarettes  ?  Start date: 01/23/1999  ?  Quit date: 05/23/2007  ?  Years since quitting: 13.8  ? Smokeless tobacco: Never  ?Substance Use Topics  ? Alcohol use: Yes  ?  Alcohol/week: 0.0 standard drinks  ?  Comment: occasional  ? ? ? ?Current Outpatient Medications:  ?  ALPRAZolam (XANAX) 0.5 MG tablet, TAKE 1 TABLET BY MOUTH  DAILY AS NEEDED FOR ANXIETY, Disp: 30 tablet, Rfl: 0 ?  aspirin 81 MG chewable tablet, Chew 81 mg by mouth daily. , Disp: , Rfl:  ?  buPROPion (WELLBUTRIN XL) 300 MG 24 hr tablet, Take 1 tablet (300 mg total) by mouth daily., Disp: 90 tablet, Rfl: 0 ?  carvedilol (COREG) 3.125 MG tablet, Take 1 tablet (3.125 mg total) by mouth 2 (two) times daily with a meal., Disp: 180 tablet, Rfl: 0 ?  cholecalciferol (VITAMIN D3) 25 MCG (1000 UNIT) tablet, Take 1,000 Units by mouth daily., Disp: , Rfl:  ?  cyclobenzaprine (FLEXERIL) 5 MG tablet, Take 1 tablet (5 mg total) by mouth at bedtime., Disp: 90 tablet, Rfl: 0 ?  ezetimibe (ZETIA) 10 MG tablet, TAKE 1 TABLET BY MOUTH  DAILY, Disp: 90 tablet, Rfl: 3 ?  hydrOXYzine (ATARAX) 10 MG tablet, TAKE 1 TO 2 TABLETS BY  MOUTH 3 TIMES DAILY AS  NEEDED, Disp: 90 tablet, Rfl: 0 ?  lisinopril (ZESTRIL) 20 MG tablet, Take 1 tablet (20 mg total) by mouth daily., Disp: 90 tablet, Rfl: 0 ?  nitroGLYCERIN (NITROSTAT) 0.4 MG SL tablet, DISSOLVE 1 TABLET UNDER THE TONGUE EVERY 5 MINUTES AS  NEEDED FOR CHEST PAIN. MAX  OF 3 TABLETS IN 15 MINUTES. CALL 911 IF PAIN PERSISTS., Disp: 100 tablet, Rfl: 3 ?  rosuvastatin (CRESTOR) 20 MG tablet, Take 1 tablet (20 mg total) by mouth daily., Disp: 90 tablet, Rfl: 0 ?  temazepam (RESTORIL) 15 MG capsule, TAKE 1 CAPSULE BY MOUTH AT  BEDTIME AS NEEDED FOR SLEEP, Disp: 90 capsule, Rfl: 0 ? ?Allergies  ?Allergen Reactions  ? Codeine Other (See Comments)  ?  Sensitivity. Nauseated   ? ? ?I personally reviewed active problem list, medication list, allergies, family history, social history, health  maintenance with the patient/caregiver today. ? ? ?ROS ? ?Constitutional: Negative for fever or weight change.  ?Respiratory: Negative for cough and shortness of breath.   ?Cardiovascular: Negative for chest pain or palpitations.  ?Gastrointestinal: Negative for abdominal pain, no bowel changes.  ?Musculoskeletal: Negative for gait problem or joint swelling.  ?Skin: Negative for rash.  ?Neurological: Negative for dizziness or headache.  ?No other specific complaints in a complete review of systems (except as listed in HPI above).  ? ?Objective ? ?Vitals:  ?  04/11/21 1118  ?BP: 126/84  ?Pulse: 90  ?Resp: 16  ?SpO2: 97%  ?Weight: 146 lb (66.2 kg)  ?Height: '5\' 2"'$  (1.575 m)  ? ? ?Body mass index is 26.7 kg/m?. ? ?Physical Exam ? ?Constitutional: Patient appears well-developed and well-nourished. No distress.  ?HEENT: head atraumatic, normocephalic, pupils equal and reactive to light, neck supple ?Cardiovascular: Normal rate, regular rhythm and normal heart sounds.  No murmur heard. No BLE edema. ?Pulmonary/Chest: Effort normal and breath sounds normal. No respiratory distress. ?Abdominal: Soft.  There is no tenderness. ?Psychiatric: Patient has a normal mood and affect. behavior is normal. Judgment and thought content normal.  ? ?PHQ2/9: ?Depression screen [facility] 2/9 04/11/2021 09/08/2020 08/03/2020 03/10/2020 09/04/2019  ?Decreased Interest 0 0 0 0 0  ?Down, Depressed, Hopeless 3 0 1 0 0  ?PHQ - 2 Score 3 0 1 0 0  ?Altered sleeping 0 0 1 - 0  ?Tired, decreased energy 0 0 0 - 0  ?Change in appetite 0 0 0 - 0  ?Feeling bad or failure about yourself  0 0 0 - 0  ?Trouble concentrating 0 0 0 - 0  ?Moving slowly or fidgety/restless 0 0 0 - 0  ?Suicidal thoughts 0 0 0 - 0  ?PHQ-9 Score 3 0 2 - 0  ?Difficult doing work/chores - - - - -  ?Some recent data might be hidden  ?  ?phq 9 is positive ? ? ?Fall Risk: ?Fall Risk  04/11/2021 09/08/2020 08/03/2020 03/10/2020 09/04/2019  ?Falls in the past year? 0 0 0 1 0  ?Number falls in past yr: 0 0  0 1 0  ?Injury with Fall? 0 0 0 0 0  ?Risk for fall due to : No Fall Risks No Fall Risks - - -  ?Follow up Falls prevention discussed Falls prevention discussed - - -  ? ? ? ? ?Functional Status Survey: ?Is the pat

## 2021-04-11 ENCOUNTER — Ambulatory Visit (INDEPENDENT_AMBULATORY_CARE_PROVIDER_SITE_OTHER): Payer: No Typology Code available for payment source | Admitting: Family Medicine

## 2021-04-11 ENCOUNTER — Other Ambulatory Visit: Payer: Self-pay

## 2021-04-11 ENCOUNTER — Encounter: Payer: Self-pay | Admitting: Family Medicine

## 2021-04-11 VITALS — BP 126/84 | HR 90 | Resp 16 | Ht 62.0 in | Wt 146.0 lb

## 2021-04-11 DIAGNOSIS — M545 Low back pain, unspecified: Secondary | ICD-10-CM | POA: Diagnosis not present

## 2021-04-11 DIAGNOSIS — I2510 Atherosclerotic heart disease of native coronary artery without angina pectoris: Secondary | ICD-10-CM

## 2021-04-11 DIAGNOSIS — E538 Deficiency of other specified B group vitamins: Secondary | ICD-10-CM | POA: Diagnosis not present

## 2021-04-11 DIAGNOSIS — I10 Essential (primary) hypertension: Secondary | ICD-10-CM | POA: Diagnosis not present

## 2021-04-11 DIAGNOSIS — G4709 Other insomnia: Secondary | ICD-10-CM

## 2021-04-11 DIAGNOSIS — F320 Major depressive disorder, single episode, mild: Secondary | ICD-10-CM | POA: Diagnosis not present

## 2021-04-11 DIAGNOSIS — D692 Other nonthrombocytopenic purpura: Secondary | ICD-10-CM | POA: Diagnosis not present

## 2021-04-11 DIAGNOSIS — R739 Hyperglycemia, unspecified: Secondary | ICD-10-CM

## 2021-04-11 DIAGNOSIS — I209 Angina pectoris, unspecified: Secondary | ICD-10-CM

## 2021-04-11 DIAGNOSIS — E785 Hyperlipidemia, unspecified: Secondary | ICD-10-CM

## 2021-04-11 MED ORDER — EZETIMIBE 10 MG PO TABS: 10.0000 mg | ORAL_TABLET | Freq: Every day | ORAL | 1 refills | Status: DC

## 2021-04-11 NOTE — Patient Instructions (Signed)
Check Tdap and Shingrix coverage with insurance  ?

## 2021-06-18 ENCOUNTER — Other Ambulatory Visit: Payer: Self-pay | Admitting: Family Medicine

## 2021-06-18 DIAGNOSIS — I10 Essential (primary) hypertension: Secondary | ICD-10-CM

## 2021-06-18 DIAGNOSIS — I2510 Atherosclerotic heart disease of native coronary artery without angina pectoris: Secondary | ICD-10-CM

## 2021-06-18 DIAGNOSIS — E785 Hyperlipidemia, unspecified: Secondary | ICD-10-CM

## 2021-06-20 ENCOUNTER — Other Ambulatory Visit: Payer: Self-pay

## 2021-06-20 DIAGNOSIS — I10 Essential (primary) hypertension: Secondary | ICD-10-CM

## 2021-06-20 DIAGNOSIS — I2510 Atherosclerotic heart disease of native coronary artery without angina pectoris: Secondary | ICD-10-CM

## 2021-06-20 DIAGNOSIS — E785 Hyperlipidemia, unspecified: Secondary | ICD-10-CM

## 2021-06-20 MED ORDER — CARVEDILOL 3.125 MG PO TABS: 3.1250 mg | ORAL_TABLET | Freq: Two times a day (BID) | ORAL | 0 refills | Status: DC

## 2021-06-20 MED ORDER — ROSUVASTATIN CALCIUM 20 MG PO TABS: 20.0000 mg | ORAL_TABLET | Freq: Every day | ORAL | 0 refills | Status: DC

## 2021-06-20 MED ORDER — LISINOPRIL 20 MG PO TABS: 20.0000 mg | ORAL_TABLET | Freq: Every day | ORAL | 0 refills | Status: DC

## 2021-06-28 ENCOUNTER — Other Ambulatory Visit: Payer: Self-pay | Admitting: Family Medicine

## 2021-06-28 DIAGNOSIS — F320 Major depressive disorder, single episode, mild: Secondary | ICD-10-CM

## 2021-06-29 MED ORDER — LISINOPRIL 20 MG PO TABS: 20.0000 mg | ORAL_TABLET | Freq: Every day | ORAL | 0 refills | Status: DC

## 2021-06-29 MED ORDER — CARVEDILOL 3.125 MG PO TABS: 3.1250 mg | ORAL_TABLET | Freq: Two times a day (BID) | ORAL | 0 refills | Status: DC

## 2021-06-29 MED ORDER — ROSUVASTATIN CALCIUM 20 MG PO TABS: 20.0000 mg | ORAL_TABLET | Freq: Every day | ORAL | 0 refills | Status: DC

## 2021-06-29 NOTE — Telephone Encounter (Signed)
Resending to another pharmacy.  Requested Prescriptions  Pending Prescriptions Disp Refills  . carvedilol (COREG) 3.125 MG tablet 180 tablet 0    Sig: Take 1 tablet (3.125 mg total) by mouth 2 (two) times daily with a meal.     Cardiovascular: Beta Blockers 3 Passed - 06/29/2021 11:38 AM      Passed - Cr in normal range and within 360 days    Creat  Date Value Ref Range Status  08/03/2020 0.82 0.50 - 1.05 mg/dL Final         Passed - AST in normal range and within 360 days    AST  Date Value Ref Range Status  08/03/2020 32 10 - 35 U/L Final         Passed - ALT in normal range and within 360 days    ALT  Date Value Ref Range Status  08/03/2020 28 6 - 29 U/L Final         Passed - Last BP in normal range    BP Readings from Last 1 Encounters:  04/11/21 126/84         Passed - Last Heart Rate in normal range    Pulse Readings from Last 1 Encounters:  04/11/21 90         Passed - Valid encounter within last 6 months    Recent Outpatient Visits          2 months ago Angina pectoris syndrome [facility])   [facility] Steele Sizer, MD   9 months ago Benign essential HTN   [facility] Steele Sizer, MD   11 months ago Well adult exam   [facility] Steele Sizer, MD   1 year ago B12 deficiency   [facility] Steele Sizer, MD   1 year ago Benign essential HTN   [facility] Steele Sizer, MD      Future Appointments            In 1 month Ancil Boozer, Drue Stager, MD [facility], [place]   In 3 months Steele Sizer, MD [facility], [place]           . rosuvastatin (CRESTOR) 20 MG tablet 90 tablet 0    Sig: Take 1 tablet (20 mg total) by mouth daily.     Cardiovascular:  Antilipid - Statins 2 Failed - 06/29/2021 11:38 AM      Failed - Lipid Panel in normal range within the last 12 months    Cholesterol, Total  Date Value Ref Range Status   08/26/2014 170 100 - 199 mg/dL Final   Cholesterol  Date Value Ref Range Status  08/03/2020 186 <200 mg/dL Final   LDL Cholesterol (Calc)  Date Value Ref Range Status  08/03/2020 90 mg/dL (calc) Final    Comment:    Reference range: <100 . Desirable range <100 mg/dL for primary prevention;   <70 mg/dL for patients with CHD or diabetic patients  with > or = 2 CHD risk factors. Marland Kitchen LDL-C is now calculated using the Martin-Hopkins  calculation, which is a validated novel method providing  better accuracy than the Friedewald equation in the  estimation of LDL-C.  Cresenciano Genre et al. Annamaria Helling. 7425;956(38): 2061-2068  (http://education.[facility].com/faq/FAQ164)    HDL  Date Value Ref Range Status  08/03/2020 68 > OR = 50 mg/dL Final  08/26/2014 66 >39 mg/dL Final    Comment:    According to ATP-III Guidelines,  HDL-C >59 mg/dL is considered a negative risk factor for CHD.    Triglycerides  Date Value Ref Range Status  08/03/2020 188 (H) <150 mg/dL Final         Passed - Cr in normal range and within 360 days    Creat  Date Value Ref Range Status  08/03/2020 0.82 0.50 - 1.05 mg/dL Final         Passed - Patient is not pregnant      Passed - Valid encounter within last 12 months    Recent Outpatient Visits          2 months ago Angina pectoris syndrome [facility])   [facility] Steele Sizer, MD   9 months ago Benign essential HTN   [facility] Steele Sizer, MD   11 months ago Well adult exam   [facility] Steele Sizer, MD   1 year ago B12 deficiency   [facility] Steele Sizer, MD   1 year ago Benign essential HTN   [facility] Steele Sizer, MD      Future Appointments            In 1 month Ancil Boozer, Drue Stager, MD [facility], [place]   In 3 months Steele Sizer, MD [facility], [place]           . lisinopril (ZESTRIL)  20 MG tablet 90 tablet 0    Sig: Take 1 tablet (20 mg total) by mouth daily.     Cardiovascular:  ACE Inhibitors Failed - 06/29/2021 11:38 AM      Failed - Cr in normal range and within 180 days    Creat  Date Value Ref Range Status  08/03/2020 0.82 0.50 - 1.05 mg/dL Final         Failed - K in normal range and within 180 days    Potassium  Date Value Ref Range Status  08/03/2020 4.8 3.5 - 5.3 mmol/L Final         Passed - Patient is not pregnant      Passed - Last BP in normal range    BP Readings from Last 1 Encounters:  04/11/21 126/84         Passed - Valid encounter within last 6 months    Recent Outpatient Visits          2 months ago Angina pectoris syndrome [facility])   [facility] Steele Sizer, MD   9 months ago Benign essential HTN   [facility] Steele Sizer, MD   11 months ago Well adult exam   [facility] Steele Sizer, MD   1 year ago B12 deficiency   [facility] Steele Sizer, MD   1 year ago Benign essential HTN   [facility], Drue Stager, MD      Future Appointments            In 1 month Steele Sizer, MD [facility], [place]   In 3 months Steele Sizer, MD [facility]           Signed Prescriptions Disp Refills   carvedilol (COREG) 3.125 MG tablet 180 tablet 0    Sig: Take 1 tablet (3.125 mg total) by mouth 2 (two) times daily with a meal.     Cardiovascular: Beta Blockers 3 Passed - 06/20/2021  8:34 AM  Passed - Cr in normal range and within 360 days    Creat  Date Value Ref Range Status  08/03/2020 0.82 0.50 - 1.05 mg/dL Final         Passed - AST in normal range and within 360 days    AST  Date Value Ref Range Status  08/03/2020 32 10 - 35 U/L Final         Passed - ALT in normal range and within 360 days    ALT  Date Value Ref Range Status  08/03/2020 28 6 - 29 U/L Final          Passed - Last BP in normal range    BP Readings from Last 1 Encounters:  04/11/21 126/84         Passed - Last Heart Rate in normal range    Pulse Readings from Last 1 Encounters:  04/11/21 90         Passed - Valid encounter within last 6 months    Recent Outpatient Visits          2 months ago Angina pectoris syndrome [facility])   [facility] Steele Sizer, MD   9 months ago Benign essential HTN   [facility] Steele Sizer, MD   11 months ago Well adult exam   [facility] Steele Sizer, MD   1 year ago B12 deficiency   [facility] Steele Sizer, MD   1 year ago Benign essential HTN   [facility] Steele Sizer, MD      Future Appointments            In 1 month Steele Sizer, MD [facility], [place]   In 3 months Steele Sizer, MD [facility]            lisinopril (ZESTRIL) 20 MG tablet 90 tablet 0    Sig: Take 1 tablet (20 mg total) by mouth daily.     Cardiovascular:  ACE Inhibitors Failed - 06/20/2021  8:34 AM      Failed - Cr in normal range and within 180 days    Creat  Date Value Ref Range Status  08/03/2020 0.82 0.50 - 1.05 mg/dL Final         Failed - K in normal range and within 180 days    Potassium  Date Value Ref Range Status  08/03/2020 4.8 3.5 - 5.3 mmol/L Final         Passed - Patient is not pregnant      Passed - Last BP in normal range    BP Readings from Last 1 Encounters:  04/11/21 126/84         Passed - Valid encounter within last 6 months    Recent Outpatient Visits          2 months ago Angina pectoris syndrome [facility])   [facility] Steele Sizer, MD   9 months ago Benign essential HTN   [facility] Steele Sizer, MD   11 months ago Well adult exam   [facility] Steele Sizer, MD   1 year ago B12 deficiency    [facility] Steele Sizer, MD   1 year ago Benign essential HTN   [facility] Steele Sizer, MD      Future Appointments            In 1  month Steele Sizer, MD [place]   In 3 months Steele Sizer, MD [facility]            rosuvastatin (CRESTOR) 20 MG tablet 90 tablet 0    Sig: Take 1 tablet (20 mg total) by mouth daily.     Cardiovascular:  Antilipid - Statins 2 Failed - 06/20/2021  8:34 AM      Failed - Lipid Panel in normal range within the last 12 months    Cholesterol, Total  Date Value Ref Range Status  08/26/2014 170 100 - 199 mg/dL Final   Cholesterol  Date Value Ref Range Status  08/03/2020 186 <200 mg/dL Final   LDL Cholesterol (Calc)  Date Value Ref Range Status  08/03/2020 90 mg/dL (calc) Final    Comment:    Reference range: <100 . Desirable range <100 mg/dL for primary prevention;   <70 mg/dL for patients with CHD or diabetic patients  with > or = 2 CHD risk factors. Marland Kitchen LDL-C is now calculated using the Martin-Hopkins  calculation, which is a validated novel method providing  better accuracy than the Friedewald equation in the  estimation of LDL-C.  Cresenciano Genre et al. Annamaria Helling. 1884;166(06): 2061-2068  (http://education.[facility].com/faq/FAQ164)    HDL  Date Value Ref Range Status  08/03/2020 68 > OR = 50 mg/dL Final  08/26/2014 66 >39 mg/dL Final    Comment:    According to ATP-III Guidelines, HDL-C >59 mg/dL is considered a negative risk factor for CHD.    Triglycerides  Date Value Ref Range Status  08/03/2020 188 (H) <150 mg/dL Final         Passed - Cr in normal range and within 360 days    Creat  Date Value Ref Range Status  08/03/2020 0.82 0.50 - 1.05 mg/dL Final         Passed - Patient is not pregnant      Passed - Valid encounter within last 12 months    Recent Outpatient Visits          2 months ago Angina pectoris  syndrome [facility])   [facility] Steele Sizer, MD   9 months ago Benign essential HTN   [facility] Steele Sizer, MD   11 months ago Well adult exam   [facility] Steele Sizer, MD   1 year ago B12 deficiency   [facility] Steele Sizer, MD   1 year ago Benign essential HTN   [facility] Steele Sizer, MD      Future Appointments            In 1 month Ancil Boozer, Drue Stager, MD [facility], [place]   In 3 months Steele Sizer, MD [facility]

## 2021-06-29 NOTE — Addendum Note (Signed)
Addended by: Matilde Sprang on: 06/29/2021 11:38 AM   Modules accepted: Orders

## 2021-06-29 NOTE — Telephone Encounter (Signed)
Chris from [employer] called to request that the patient wants to have Carvedilol, Lisinopril, and Crestor via [facility] instead of [facility]   [facility] [place] [place] [facility]  [address] [place] [address]  Phone: [phone] Fax: [phone]

## 2021-08-08 NOTE — Progress Notes (Deleted)
Name: Elizabeth Johnson   MRN: [id number]    DOB: [birth year]   Date:08/08/2021       Progress Note  Subjective  Chief Complaint  Annual Exam  HPI  Patient presents for annual CPE.  Diet: *** Exercise: ***   Flowsheet Row Office Visit from 08/03/2020 in [facility]  AUDIT-C Score 4      Depression: Phq 9 is  {Desc; negative/positive:13464}    04/11/2021   11:18 AM 09/08/2020    9:57 AM 08/03/2020   10:47 AM 03/10/2020   10:47 AM 09/04/2019   12:43 PM  Depression screen PHQ 2/9  Decreased Interest 0 0 0 0 0  Down, Depressed, Hopeless 3 0 1 0 0  PHQ - 2 Score 3 0 1 0 0  Altered sleeping 0 0 1  0  Tired, decreased energy 0 0 0  0  Change in appetite 0 0 0  0  Feeling bad or failure about yourself  0 0 0  0  Trouble concentrating 0 0 0  0  Moving slowly or fidgety/restless 0 0 0  0  Suicidal thoughts 0 0 0  0  PHQ-9 Score 3 0 2  0   Hypertension: BP Readings from Last 3 Encounters:  04/11/21 126/84  09/08/20 126/70  08/03/20 112/78   Obesity: Wt Readings from Last 3 Encounters:  04/11/21 146 lb (66.2 kg)  09/08/20 147 lb (66.7 kg)  08/03/20 148 lb (67.1 kg)   BMI Readings from Last 3 Encounters:  04/11/21 26.70 kg/m  09/08/20 26.89 kg/m  08/03/20 27.07 kg/m     Vaccines:   HPV: N/A Tdap: 2009 Shingrix: N/A Pneumonia: up to date Flu: up to date COVID-19: up to date   Hep C Screening: 09/10/13 STD testing and prevention (HIV/chl/gon/syphilis): N/A Intimate partner violence: negative screen  Sexual History : Menstrual History/LMP/Abnormal Bleeding:  Discussed importance of follow up if any post-menopausal bleeding: {Response; yes/no/na:63}  Incontinence Symptoms: {Desc; negative/positive:13464} for symptoms   Breast cancer:  - Last Mammogram: Ordered 09/02/20 - BRCA gene screening: N/A  Osteoporosis Prevention : Discussed high calcium and vitamin D supplementation, weight bearing exercises Bone density : 01/02/17   Cervical  cancer screening: N/A  Skin cancer: Discussed monitoring for atypical lesions  Colorectal cancer: 11/23/16   Lung cancer:  Low Dose CT Chest recommended if Age 53-80 years, 20 pack-year currently smoking OR have quit w/in 15years. Patient does not qualify for screen   ECG: 10/16/16  Advanced Care Planning: A voluntary discussion about advance care planning including the explanation and discussion of advance directives.  Discussed health care proxy and Living will, and the patient was able to identify a health care proxy as ***.  Patient does not have a living will and power of attorney of health care   Lipids: Lab Results  Component Value Date   CHOL 186 08/03/2020   CHOL 188 05/05/2019   CHOL 255 (H) 09/10/2017   Lab Results  Component Value Date   HDL 68 08/03/2020   HDL 66 05/05/2019   HDL 51 09/10/2017   Lab Results  Component Value Date   LDLCALC 90 08/03/2020   [place] 95 05/05/2019   LDLCALC 164 (H) 09/10/2017   Lab Results  Component Value Date   TRIG 188 (H) 08/03/2020   TRIG 173 (H) 05/05/2019   TRIG 238 (H) 09/10/2017   Lab Results  Component Value Date   CHOLHDL 2.7 08/03/2020   CHOLHDL 2.8 05/05/2019   CHOLHDL  5.0 (H) 09/10/2017   No results found for: "LDLDIRECT"  Glucose: Glucose, Bld  Date Value Ref Range Status  08/03/2020 119 (H) 65 - 99 mg/dL Final    Comment:    .            Fasting reference interval . For someone without known diabetes, a glucose value between 100 and 125 mg/dL is consistent with prediabetes and should be confirmed with a follow-up test. .   05/05/2019 130 (H) 65 - 99 mg/dL Final    Comment:    .            Fasting reference interval . For someone without known diabetes, a glucose value >125 mg/dL indicates that they may have diabetes and this should be confirmed with a follow-up test. .   09/10/2017 106 (H) 65 - 99 mg/dL Final    Comment:    .            Fasting reference interval . For someone without known  diabetes, a glucose value between 100 and 125 mg/dL is consistent with prediabetes and should be confirmed with a follow-up test. .    Glucose-Capillary  Date Value Ref Range Status  04/15/2017 104 (H) 65 - 99 mg/dL Final    Patient Active Problem List   Diagnosis Date Noted   Senile purpura ([place]) 09/08/2020   Angina pectoris syndrome ([place]) 04/29/2019   Trigger thumb of right hand 12/12/2017   Post-traumatic osteoarthritis of right wrist 12/12/2017   Biceps tendon rupture 09/21/2015   H/O hysterectomy for benign disease 09/02/2014   Acquired bilateral hammer toes 09/02/2014   External hemorrhoid 09/02/2014   Allergic rhinitis 07/19/2014   Carpal tunnel syndrome on right 07/19/2014   Insomnia, persistent 07/19/2014   Chronic LBP 07/19/2014   Mild major depression ([place]) 07/19/2014   Dyslipidemia 07/19/2014   Hemorrhoid 98/92/1194   Dysmetabolic syndrome 17/40/8144   Coronary artery disease involving left main coronary artery 07/19/2014   Osteopenia 07/19/2014   Benign essential HTN 02/04/2009   H/O acute myocardial infarction of lateral wall 07/18/2004    Past Surgical History:  Procedure Laterality Date   ABDOMINAL HYSTERECTOMY     BUNIONECTOMY Bilateral 1973   CARPAL TUNNEL RELEASE Right 04/15/2017   Procedure: CARPAL TUNNEL RELEASE;  Surgeon: Earnestine Leys, MD;  Location: [facility] ORS;  Service: Orthopedics;  Laterality: Right;   CATARACT EXTRACTION, BILATERAL  05/21 & 06/21   COLONOSCOPY WITH PROPOFOL N/A 11/23/2016   Procedure: COLONOSCOPY WITH PROPOFOL;  Surgeon: [name], MD;  Location: [facility] ENDOSCOPY;  Service: Gastroenterology;  Laterality: N/A;   CORONARY STENT PLACEMENT  2009   ELBOW SURGERY Left 1977   HAMMER TOE SURGERY Right 1992   RIB FRACTURE SURGERY     WRIST FRACTURE SURGERY Right 75    Family History  Problem Relation Age of Onset   Alzheimer's disease Mother    Breast cancer Mother 86   CAD Father    Heart attack Father    Heart failure  Father    COPD Father    Drug abuse Sister    Suicidality Sister    Heart attack Brother     Social History   Socioeconomic History   Marital status: Significant Other    Spouse name: Not on file   Number of children: 1   Years of education: Not on file   Highest education level: Not on file  Occupational History   Occupation: retired   Tobacco Use   Smoking status:  Former    Packs/day: 0.25    Years: 8.00    Total pack years: 2.00    Types: Cigarettes    Start date: 01/23/1999    Quit date: 05/23/2007    Years since quitting: 14.2   Smokeless tobacco: Never  Vaping Use   Vaping Use: Never used  Substance and Sexual Activity   Alcohol use: Yes    Alcohol/week: 0.0 standard drinks of alcohol    Comment: occasional   Drug use: No   Sexual activity: Yes    Partners: Male  Other Topics Concern   Not on file  Social History Narrative   She worked for the same company for 20 years and they closed [place] branch and she was forced to retire Fall 2017. When her position was moved to [facility] with fiance of 25 years.    Social Determinants of Health   Financial Resource Strain: Low Risk  (08/03/2020)   Overall Financial Resource Strain (CARDIA)    Difficulty of Paying Living Expenses: Not hard at all  Food Insecurity: No Food Insecurity (08/03/2020)   Hunger Vital Sign    Worried About Running Out of Food in the Last Year: Never true    Ran Out of Food in the Last Year: Never true  Transportation Needs: No Transportation Needs (08/03/2020)   PRAPARE - Hydrologist (Medical): No    Lack of Transportation (Non-Medical): No  Physical Activity: Sufficiently Active (08/03/2020)   Exercise Vital Sign    Days of Exercise per Week: 3 days    Minutes of Exercise per Session: 60 min  Stress: No Stress Concern Present (08/03/2020)   [place]    Feeling of Stress : Only a little  Social  Connections: Moderately Integrated (08/03/2020)   Social Connection and Isolation Panel [NHANES]    Frequency of Communication with Friends and Family: Twice a week    Frequency of Social Gatherings with Friends and Family: Three times a week    Attends Religious Services: Never    Active Member of Clubs or Organizations: Yes    Attends Archivist Meetings: 1 to 4 times per year    Marital Status: Living with partner  Intimate Partner Violence: Not At Risk (08/03/2020)   Humiliation, Afraid, Rape, and Kick questionnaire    Fear of Current or Ex-Partner: No    Emotionally Abused: No    Physically Abused: No    Sexually Abused: No     Current Outpatient Medications:    ALPRAZolam (XANAX) 0.5 MG tablet, TAKE 1 TABLET BY MOUTH  DAILY AS NEEDED FOR ANXIETY, Disp: 30 tablet, Rfl: 0   aspirin 81 MG chewable tablet, Chew 81 mg by mouth daily. , Disp: , Rfl:    buPROPion (WELLBUTRIN XL) 300 MG 24 hr tablet, TAKE 1 TABLET DAILY, Disp: 90 tablet, Rfl: 1   carvedilol (COREG) 3.125 MG tablet, Take 1 tablet (3.125 mg total) by mouth 2 (two) times daily with a meal., Disp: 180 tablet, Rfl: 0   cholecalciferol (VITAMIN D3) 25 MCG (1000 UNIT) tablet, Take 1,000 Units by mouth daily., Disp: , Rfl:    cyclobenzaprine (FLEXERIL) 5 MG tablet, Take 1 tablet (5 mg total) by mouth at bedtime., Disp: 90 tablet, Rfl: 0   ezetimibe (ZETIA) 10 MG tablet, Take 1 tablet (10 mg total) by mouth daily., Disp: 90 tablet, Rfl: 1   hydrOXYzine (ATARAX) 10 MG tablet,  TAKE 1 TO 2 TABLETS BY  MOUTH 3 TIMES DAILY AS  NEEDED, Disp: 90 tablet, Rfl: 0   lisinopril (ZESTRIL) 20 MG tablet, Take 1 tablet (20 mg total) by mouth daily., Disp: 90 tablet, Rfl: 0   nitroGLYCERIN (NITROSTAT) 0.4 MG SL tablet, DISSOLVE 1 TABLET UNDER THE TONGUE EVERY 5 MINUTES AS  NEEDED FOR CHEST PAIN. MAX  OF 3 TABLETS IN 15 MINUTES. CALL 911 IF PAIN PERSISTS., Disp: 100 tablet, Rfl: 3   rosuvastatin (CRESTOR) 20 MG tablet, Take 1 tablet (20 mg  total) by mouth daily., Disp: 90 tablet, Rfl: 0  Allergies  Allergen Reactions   Codeine Other (See Comments)    Sensitivity. Nauseated      ROS  ***  Objective  There were no vitals filed for this visit.  There is no height or weight on file to calculate BMI.  Physical Exam ***  No results found for this or any previous visit (from the past 2160 hour(s)).   Fall Risk:    04/11/2021   11:18 AM 09/08/2020    9:49 AM 08/03/2020   10:47 AM 03/10/2020   10:42 AM 09/04/2019   12:43 PM  Fall Risk   Falls in the past year? 0 0 0 1 0  Number falls in past yr: 0 0 0 1 0  Injury with Fall? 0 0 0 0 0  Risk for fall due to : No Fall Risks No Fall Risks     Follow up Falls prevention discussed Falls prevention discussed        Functional Status Survey:     Assessment & Plan  1. Well adult exam ***   -USPSTF grade A and B recommendations reviewed with patient; age-appropriate recommendations, preventive care, screening tests, etc discussed and encouraged; healthy living encouraged; see AVS for patient education given to patient -Discussed importance of 150 minutes of physical activity weekly, eat two servings of fish weekly, eat one serving of tree nuts ( cashews, pistachios, pecans, almonds.Marland Kitchen) every other day, eat 6 servings of fruit/vegetables daily and drink plenty of water and avoid sweet beverages.   -Reviewed Health Maintenance: Yes.

## 2021-08-09 ENCOUNTER — Encounter: Payer: No Typology Code available for payment source | Admitting: Family Medicine

## 2021-08-09 DIAGNOSIS — Z0000 Encounter for general adult medical examination without abnormal findings: Secondary | ICD-10-CM

## 2021-09-11 DIAGNOSIS — Z0100 Encounter for examination of eyes and vision without abnormal findings: Secondary | ICD-10-CM | POA: Diagnosis not present

## 2021-09-11 DIAGNOSIS — H5203 Hypermetropia, bilateral: Secondary | ICD-10-CM | POA: Diagnosis not present

## 2021-09-12 ENCOUNTER — Telehealth: Payer: Self-pay | Admitting: Family Medicine

## 2021-09-12 ENCOUNTER — Other Ambulatory Visit: Payer: Self-pay

## 2021-09-12 DIAGNOSIS — Z1211 Encounter for screening for malignant neoplasm of colon: Secondary | ICD-10-CM

## 2021-09-12 NOTE — Telephone Encounter (Signed)
Copied from [place] [id number]. Topic: General - Other >> Sep 12, 2021 11:55 AM Tiffany B wrote: Reason for CRM:  Patient received alert in Mychart that she was due for her colonoscopy, patient would prefer to do a cologuard. Patient would like a follow up call regarding the process.colonoscopy

## 2021-09-12 NOTE — Telephone Encounter (Signed)
Called patient and let her know Cologaurd order has been placed. Answered all questions. Patient gave verbal understanding.

## 2021-09-26 DIAGNOSIS — Z1211 Encounter for screening for malignant neoplasm of colon: Secondary | ICD-10-CM | POA: Diagnosis not present

## 2021-10-02 ENCOUNTER — Other Ambulatory Visit: Payer: Self-pay

## 2021-10-02 DIAGNOSIS — R195 Other fecal abnormalities: Secondary | ICD-10-CM

## 2021-10-02 LAB — COLOGUARD: COLOGUARD: POSITIVE — AB

## 2021-10-09 ENCOUNTER — Telehealth: Payer: Self-pay

## 2021-10-09 ENCOUNTER — Other Ambulatory Visit: Payer: Self-pay

## 2021-10-09 DIAGNOSIS — Z86010 Personal history of colonic polyps: Secondary | ICD-10-CM

## 2021-10-09 DIAGNOSIS — R195 Other fecal abnormalities: Secondary | ICD-10-CM

## 2021-10-09 MED ORDER — NA SULFATE-K SULFATE-MG SULF 17.5-3.13-1.6 GM/177ML PO SOLN: 1.0000 | Freq: Once | ORAL | 0 refills | Status: AC

## 2021-10-09 NOTE — Telephone Encounter (Signed)
Gastroenterology Pre-Procedure Review  Request Date: 11/03/21 Requesting Physician: Dr. Vicente Males  PATIENT REVIEW QUESTIONS: The patient responded to the following health history questions as indicated:    1. Are you having any GI issues? No GI issues noticed by patient, referral received for positive colorectal screening using cologuard 2. Do you have a personal history of Polyps? yes (last colonoscopy performed by Dr. Vicente Males 12/12/16 noted polyps) 3. Do you have a family history of Colon Cancer or Polyps? no 4. Diabetes Mellitus? no 5. Joint replacements in the past 12 months?no 6. Major health problems in the past 3 months?no 7. Any artificial heart valves, MVP, or defibrillator?no    MEDICATIONS & ALLERGIES:    Patient reports the following regarding taking any anticoagulation/antiplatelet therapy:   Plavix, Coumadin, Eliquis, Xarelto, Lovenox, Pradaxa, Brilinta, or Effient? no Aspirin? yes ('81mg'$  aspirin daily)  Patient confirms/reports the following medications:  Current Outpatient Medications  Medication Sig Dispense Refill   ALPRAZolam (XANAX) 0.5 MG tablet TAKE 1 TABLET BY MOUTH  DAILY AS NEEDED FOR ANXIETY 30 tablet 0   aspirin 81 MG chewable tablet Chew 81 mg by mouth daily.      buPROPion (WELLBUTRIN XL) 300 MG 24 hr tablet TAKE 1 TABLET DAILY 90 tablet 1   carvedilol (COREG) 3.125 MG tablet Take 1 tablet (3.125 mg total) by mouth 2 (two) times daily with a meal. 180 tablet 0   cholecalciferol (VITAMIN D3) 25 MCG (1000 UNIT) tablet Take 1,000 Units by mouth daily.     cyclobenzaprine (FLEXERIL) 5 MG tablet Take 1 tablet (5 mg total) by mouth at bedtime. 90 tablet 0   ezetimibe (ZETIA) 10 MG tablet Take 1 tablet (10 mg total) by mouth daily. 90 tablet 1   hydrOXYzine (ATARAX) 10 MG tablet TAKE 1 TO 2 TABLETS BY  MOUTH 3 TIMES DAILY AS  NEEDED 90 tablet 0   lisinopril (ZESTRIL) 20 MG tablet Take 1 tablet (20 mg total) by mouth daily. 90 tablet 0   nitroGLYCERIN (NITROSTAT) 0.4 MG SL  tablet DISSOLVE 1 TABLET UNDER THE TONGUE EVERY 5 MINUTES AS  NEEDED FOR CHEST PAIN. MAX  OF 3 TABLETS IN 15 MINUTES. CALL 911 IF PAIN PERSISTS. 100 tablet 3   rosuvastatin (CRESTOR) 20 MG tablet Take 1 tablet (20 mg total) by mouth daily. 90 tablet 0   No current facility-administered medications for this visit.    Patient confirms/reports the following allergies:  Allergies  Allergen Reactions   Codeine Other (See Comments)    Sensitivity. Nauseated     No orders of the defined types were placed in this encounter.   AUTHORIZATION INFORMATION Primary Insurance: 1D#: Group #:  Secondary Insurance: 1D#: Group #:  SCHEDULE INFORMATION: Date: 11/03/21 Time: Location: [place]

## 2021-10-13 ENCOUNTER — Ambulatory Visit: Payer: No Typology Code available for payment source | Admitting: Family Medicine

## 2021-11-03 ENCOUNTER — Ambulatory Visit: Payer: No Typology Code available for payment source | Admitting: Certified Registered"

## 2021-11-03 ENCOUNTER — Ambulatory Visit
Admission: RE | Admit: 2021-11-03 | Discharge: 2021-11-03 | Disposition: A | Discharge status: Home / Self Care | Payer: No Typology Code available for payment source | Attending: Gastroenterology | Admitting: Gastroenterology

## 2021-11-03 ENCOUNTER — Encounter
Admission: RE | Disposition: A | Discharge status: Home / Self Care | Payer: Self-pay | Source: Home / Self Care | Attending: Gastroenterology

## 2021-11-03 ENCOUNTER — Other Ambulatory Visit: Payer: Self-pay

## 2021-11-03 DIAGNOSIS — I2510 Atherosclerotic heart disease of native coronary artery without angina pectoris: Secondary | ICD-10-CM | POA: Insufficient documentation

## 2021-11-03 DIAGNOSIS — D122 Benign neoplasm of ascending colon: Secondary | ICD-10-CM | POA: Diagnosis not present

## 2021-11-03 DIAGNOSIS — K640 First degree hemorrhoids: Secondary | ICD-10-CM | POA: Insufficient documentation

## 2021-11-03 DIAGNOSIS — K644 Residual hemorrhoidal skin tags: Secondary | ICD-10-CM | POA: Insufficient documentation

## 2021-11-03 DIAGNOSIS — K5790 Diverticulosis of intestine, part unspecified, without perforation or abscess without bleeding: Secondary | ICD-10-CM | POA: Diagnosis not present

## 2021-11-03 DIAGNOSIS — R195 Other fecal abnormalities: Secondary | ICD-10-CM | POA: Diagnosis not present

## 2021-11-03 DIAGNOSIS — I10 Essential (primary) hypertension: Secondary | ICD-10-CM | POA: Insufficient documentation

## 2021-11-03 DIAGNOSIS — I252 Old myocardial infarction: Secondary | ICD-10-CM | POA: Diagnosis not present

## 2021-11-03 DIAGNOSIS — Z86010 Personal history of colonic polyps: Secondary | ICD-10-CM

## 2021-11-03 DIAGNOSIS — D126 Benign neoplasm of colon, unspecified: Secondary | ICD-10-CM | POA: Diagnosis not present

## 2021-11-03 DIAGNOSIS — Z1211 Encounter for screening for malignant neoplasm of colon: Secondary | ICD-10-CM | POA: Insufficient documentation

## 2021-11-03 DIAGNOSIS — E785 Hyperlipidemia, unspecified: Secondary | ICD-10-CM | POA: Diagnosis not present

## 2021-11-03 DIAGNOSIS — Z87891 Personal history of nicotine dependence: Secondary | ICD-10-CM | POA: Diagnosis not present

## 2021-11-03 DIAGNOSIS — F419 Anxiety disorder, unspecified: Secondary | ICD-10-CM | POA: Diagnosis not present

## 2021-11-03 DIAGNOSIS — Z955 Presence of coronary angioplasty implant and graft: Secondary | ICD-10-CM | POA: Insufficient documentation

## 2021-11-03 DIAGNOSIS — K635 Polyp of colon: Secondary | ICD-10-CM | POA: Diagnosis not present

## 2021-11-03 HISTORY — PX: COLONOSCOPY WITH PROPOFOL: SHX5780

## 2021-11-03 SURGERY — COLONOSCOPY WITH PROPOFOL
Anesthesia: General

## 2021-11-03 MED ORDER — PROPOFOL 10 MG/ML IV BOLUS
INTRAVENOUS | Status: DC | PRN
  Administered 2021-11-03 (×2): 40 mg via INTRAVENOUS
  Administered 2021-11-03: 100 mg via INTRAVENOUS
  Administered 2021-11-03 (×3): 40 mg via INTRAVENOUS

## 2021-11-03 MED ORDER — SODIUM CHLORIDE 0.9 % IV SOLN: INTRAVENOUS | Status: DC

## 2021-11-03 NOTE — Transfer of Care (Signed)
Immediate Anesthesia Transfer of Care Note  Patient: Elizabeth Johnson  Procedure(s) Performed: COLONOSCOPY WITH PROPOFOL  Patient Location: Endoscopy Unit  Anesthesia Type:General  Level of Consciousness: drowsy  Airway & Oxygen Therapy: Patient Spontanous Breathing and Patient connected to nasal cannula oxygen  Post-op Assessment: Report given to RN, Post -op Vital signs reviewed and stable and Patient moving all extremities  Post vital signs: Reviewed and stable  Last Vitals:  Vitals Value Taken Time  BP 130/85 11/03/21 1050  Temp 35.7 C 11/03/21 1050  Pulse 110 11/03/21 1051  Resp 23 11/03/21 1051  SpO2 94 % 11/03/21 1051  Vitals shown include unvalidated device data.  Last Pain:  Vitals:   11/03/21 1050  TempSrc: Tympanic  PainSc: Asleep      Patients Stated Pain Goal: 0 (52/08/02 2336)  Complications: No notable events documented.

## 2021-11-03 NOTE — Anesthesia Preprocedure Evaluation (Signed)
Anesthesia Evaluation  Patient identified by MRN, date of birth, ID band Patient awake    Reviewed: Allergy & Precautions, NPO status , Patient's Chart, lab work & pertinent test results  History of Anesthesia Complications Negative for: history of anesthetic complications  Airway Mallampati: III  TM Distance: >3 FB Neck ROM: full    Dental  (+) Dental Advidsory Given, Poor Dentition   Pulmonary neg pulmonary ROS, neg shortness of breath, neg COPD, former smoker,    Pulmonary exam normal        Cardiovascular hypertension, (-) angina+ CAD, + Past MI and + Cardiac Stents  Normal cardiovascular exam     Neuro/Psych PSYCHIATRIC DISORDERS Anxiety negative neurological ROS     GI/Hepatic negative GI ROS, Neg liver ROS,   Endo/Other  negative endocrine ROS  Renal/GU negative Renal ROS  negative genitourinary   Musculoskeletal   Abdominal   Peds  Hematology negative hematology ROS (+)   Anesthesia Other Findings Past Medical History: No date: Anxiety No date: Arthritis     Comment:  hands No date: Coronary artery disease No date: Depression No date: Hyperlipidemia No date: Hypertension 2009: Myocardial infarction [facility])     Comment:  has not seen cardiology in 9 years.  coronary stent               placed at this time  Past Surgical History: No date: ABDOMINAL HYSTERECTOMY 1973: BUNIONECTOMY; Bilateral 04/15/2017: CARPAL TUNNEL RELEASE; Right     Comment:  Procedure: CARPAL TUNNEL RELEASE;  Surgeon: Earnestine Leys, MD;  Location: [facility] ORS;  Service: Orthopedics;                Laterality: Right; 05/21 & 06/21: CATARACT EXTRACTION, BILATERAL 11/23/2016: COLONOSCOPY WITH PROPOFOL; N/A     Comment:  Procedure: COLONOSCOPY WITH PROPOFOL;  Surgeon: [name], MD;  Location: [facility] ENDOSCOPY;  Service:               Gastroenterology;  Laterality: N/A; 2009: CORONARY STENT PLACEMENT 1977:  ELBOW SURGERY; Left 1992: HAMMER TOE SURGERY; Right No date: RIB FRACTURE SURGERY 1977: WRIST FRACTURE SURGERY; Right  BMI    Body Mass Index: 23.63 kg/m      Reproductive/Obstetrics negative OB ROS                             Anesthesia Physical Anesthesia Plan  ASA: 2  Anesthesia Plan: General   Post-op Pain Management: Minimal or no pain anticipated   Induction: Intravenous  PONV Risk Score and Plan: 3 and Propofol infusion, TIVA and Ondansetron  Airway Management Planned: Nasal Cannula  Additional Equipment: None  Intra-op Plan:   Post-operative Plan:   Informed Consent: I have reviewed the patients History and Physical, chart, labs and discussed the procedure including the risks, benefits and alternatives for the proposed anesthesia with the patient or authorized representative who has indicated his/her understanding and acceptance.     Dental advisory given  Plan Discussed with: CRNA and Surgeon  Anesthesia Plan Comments: (Discussed risks of anesthesia with patient, including possibility of difficulty with spontaneous ventilation under anesthesia necessitating airway intervention, PONV, and rare risks such as cardiac or respiratory or neurological events, and allergic reactions. Discussed the role of CRNA in patient's perioperative care. Patient understands.)  Anesthesia Quick Evaluation  

## 2021-11-03 NOTE — Op Note (Signed)
[facility] Gastroenterology Patient Name: Elizabeth Johnson Procedure Date: 11/03/2021 10:19 AM MRN: [id number] Account #: [id number] Date of Birth: 29-Dec-1950 Admit Type: Outpatient Age: 71 Room: [facility] ENDO ROOM 3 Gender: Female Note Status: Finalized Instrument Name: Jasper Riling [id number] Procedure:             Colonoscopy Indications:           Positive Cologuard test Providers:             [name] MD, MD Referring MD:          Bethena Roys. Sowles, MD (Referring MD) Medicines:             Monitored Anesthesia Care Complications:         No immediate complications. Procedure:             Pre-Anesthesia Assessment:                        - Prior to the procedure, a History and Physical was                         performed, and patient medications, allergies and                         sensitivities were reviewed. The patient's tolerance                         of previous anesthesia was reviewed.                        - The risks and benefits of the procedure and the                         sedation options and risks were discussed with the                         patient. All questions were answered and informed                         consent was obtained.                        - ASA Grade Assessment: II - A patient with mild                         systemic disease.                        After obtaining informed consent, the colonoscope was                         passed under direct vision. Throughout the procedure,                         the patient's blood pressure, pulse, and oxygen                         saturations were monitored continuously. The                         Colonoscope was introduced through  the anus and                         advanced to the the cecum, identified by the                         appendiceal orifice. The colonoscopy was performed                         with ease. The patient tolerated the procedure well.                          The quality of the bowel preparation was good. Findings:      Skin tags were found on perianal exam.      Non-bleeding internal hemorrhoids were found during retroflexion. The       hemorrhoids were large and Grade I (internal hemorrhoids that do not       prolapse).      Two sessile polyps were found in the ascending colon. The polyps were 3       to 4 mm in size. These polyps were removed with a cold biopsy forceps.       Resection and retrieval were complete.      A 4 mm polyp was found in the ascending colon. The polyp was sessile.       The polyp was removed with a cold snare. Resection and retrieval were       complete.      Multiple small-mouthed diverticula were found in the sigmoid colon.      The exam was otherwise without abnormality on direct and retroflexion       views. Impression:            - Perianal skin tags found on perianal exam.                        - Non-bleeding internal hemorrhoids.                        - Two 3 to 4 mm polyps in the ascending colon, removed                         with a cold biopsy forceps. Resected and retrieved.                        - One 4 mm polyp in the ascending colon, removed with                         a cold snare. Resected and retrieved.                        - Diverticulosis in the sigmoid colon.                        - The examination was otherwise normal on direct and                         retroflexion views. Recommendation:        - Discharge patient to home (with escort).                        -  Resume previous diet.                        - Continue present medications.                        - Await pathology results.                        - Repeat colonoscopy for surveillance. Procedure Code(s):     --- Professional ---                        [phone], Colonoscopy, flexible; with removal of                         tumor(s), polyp(s), or other lesion(s) by snare                         technique                         [id number], 37, Colonoscopy, flexible; with biopsy, single                         or multiple Diagnosis Code(s):     --- Professional ---                        K63.5, Polyp of colon                        K64.4, Residual hemorrhoidal skin tags                        K64.0, First degree hemorrhoids                        R19.5, Other fecal abnormalities                        K57.30, Diverticulosis of large intestine without                         perforation or abscess without bleeding CPT copyright 2019 American Medical Association. All rights reserved. The codes documented in this report are preliminary and upon coder review may  be revised to meet current compliance requirements. [name], MD [name] MD, MD 11/03/2021 10:52:15 AM This report has been signed electronically. Number of Addenda: 0 Note Initiated On: 11/03/2021 10:19 AM Scope Withdrawal Time: 0 hours 8 minutes 44 seconds  Total Procedure Duration: 0 hours 14 minutes 2 seconds  Estimated Blood Loss:  Estimated blood loss: none.      [facility]

## 2021-11-03 NOTE — H&P (Signed)
[name], MD [address] [place], [place], [place], [address] [address], [place], [place], [place], [address] Phone: [phone]  Fax: [phone]  Primary Care Physician:  Steele Sizer, MD   Pre-Procedure History & Physical: HPI:  Elizabeth Johnson is a 71 y.o. female is here for an colonoscopy.   Past Medical History:  Diagnosis Date   Anxiety    Arthritis    hands   Coronary artery disease    Depression    Hyperlipidemia    Hypertension    Myocardial infarction [facility]) 2009   has not seen cardiology in 9 years.  coronary stent placed at this time    Past Surgical History:  Procedure Laterality Date   ABDOMINAL HYSTERECTOMY     BUNIONECTOMY Bilateral 1973   CARPAL TUNNEL RELEASE Right 04/15/2017   Procedure: CARPAL TUNNEL RELEASE;  Surgeon: Earnestine Leys, MD;  Location: [facility] ORS;  Service: Orthopedics;  Laterality: Right;   CATARACT EXTRACTION, BILATERAL  05/21 & 06/21   COLONOSCOPY WITH PROPOFOL N/A 11/23/2016   Procedure: COLONOSCOPY WITH PROPOFOL;  Surgeon: [name], MD;  Location: [facility] ENDOSCOPY;  Service: Gastroenterology;  Laterality: N/A;   CORONARY STENT PLACEMENT  2009   ELBOW SURGERY Left 1977   HAMMER TOE SURGERY Right 1992   RIB FRACTURE SURGERY     WRIST FRACTURE SURGERY Right 1977    Prior to Admission medications   Medication Sig Start Date End Date Taking? Authorizing Provider  ALPRAZolam Duanne Moron) 0.5 MG tablet TAKE 1 TABLET BY MOUTH  DAILY AS NEEDED FOR ANXIETY 11/22/20  Yes Sowles, Drue Stager, MD  ezetimibe (ZETIA) 10 MG tablet Take 1 tablet (10 mg total) by mouth daily. 04/11/21  Yes Sowles, Drue Stager, MD  lisinopril (ZESTRIL) 20 MG tablet Take 1 tablet (20 mg total) by mouth daily. 06/29/21  Yes Teodora Medici, DO  rosuvastatin (CRESTOR) 20 MG tablet Take 1 tablet (20 mg total) by mouth daily. 06/29/21  Yes Teodora Medici, DO  aspirin 81 MG chewable tablet Chew 81 mg by mouth daily.  10/03/07   [provider]  buPROPion  (WELLBUTRIN XL) 300 MG 24 hr tablet TAKE 1 TABLET DAILY 06/28/21   Myles Gip, DO  carvedilol (COREG) 3.125 MG tablet Take 1 tablet (3.125 mg total) by mouth 2 (two) times daily with a meal. 06/29/21   Teodora Medici, DO  cholecalciferol (VITAMIN D3) 25 MCG (1000 UNIT) tablet Take 1,000 Units by mouth daily. Patient not taking: Reported on 11/03/2021    [provider]  cyclobenzaprine (FLEXERIL) 5 MG tablet Take 1 tablet (5 mg total) by mouth at bedtime. 04/03/21   Steele Sizer, MD  hydrOXYzine (ATARAX) 10 MG tablet TAKE 1 TO 2 TABLETS BY  MOUTH 3 TIMES DAILY AS  NEEDED 04/03/21   Ancil Boozer, Drue Stager, MD  nitroGLYCERIN (NITROSTAT) 0.4 MG SL tablet DISSOLVE 1 TABLET UNDER THE TONGUE EVERY 5 MINUTES AS  NEEDED FOR CHEST PAIN. MAX  OF 3 TABLETS IN 15 MINUTES. CALL 911 IF PAIN PERSISTS. 07/12/20   Steele Sizer, MD    Allergies as of 10/09/2021 - Review Complete 04/11/2021  Allergen Reaction Noted   Codeine Other (See Comments) 07/19/2014    Family History  Problem Relation Age of Onset   Alzheimer's disease Mother    Breast cancer Mother 31   CAD Father    Heart attack Father    Heart failure Father    COPD Father    Drug abuse Sister    Suicidality Sister  Heart attack Brother     Social History   Socioeconomic History   Marital status: Significant Other    Spouse name: Not on file   Number of children: 1   Years of education: Not on file   Highest education level: Not on file  Occupational History   Occupation: retired   Tobacco Use   Smoking status: Former    Packs/day: 0.25    Years: 8.00    Total pack years: 2.00    Types: Cigarettes    Start date: 01/23/1999    Quit date: 05/23/2007    Years since quitting: 14.4   Smokeless tobacco: Never  Vaping Use   Vaping Use: Never used  Substance and Sexual Activity   Alcohol use: Yes    Alcohol/week: 0.0 standard drinks of alcohol    Comment: occasional   Drug use: No   Sexual activity: Yes    Partners:  Male  Other Topics Concern   Not on file  Social History Narrative   She worked for the same company for 20 years and they closed [employer] branch and she was forced to retire Fall 2017. When her position was moved to [employer] with fiance of 25 years.    Social Determinants of Health   Financial Resource Strain: Low Risk  (08/03/2020)   Overall Financial Resource Strain (CARDIA)    Difficulty of Paying Living Expenses: Not hard at all  Food Insecurity: No Food Insecurity (08/03/2020)   Hunger Vital Sign    Worried About Running Out of Food in the Last Year: Never true    Ran Out of Food in the Last Year: Never true  Transportation Needs: No Transportation Needs (08/03/2020)   PRAPARE - Hydrologist (Medical): No    Lack of Transportation (Non-Medical): No  Physical Activity: Sufficiently Active (08/03/2020)   Exercise Vital Sign    Days of Exercise per Week: 3 days    Minutes of Exercise per Session: 60 min  Stress: No Stress Concern Present (08/03/2020)   [place]    Feeling of Stress : Only a little  Social Connections: Moderately Integrated (08/03/2020)   Social Connection and Isolation Panel [NHANES]    Frequency of Communication with Friends and Family: Twice a week    Frequency of Social Gatherings with Friends and Family: Three times a week    Attends Religious Services: Never    Active Member of Clubs or Organizations: Yes    Attends Archivist Meetings: 1 to 4 times per year    Marital Status: Living with partner  Intimate Partner Violence: Not At Risk (08/03/2020)   Humiliation, Afraid, Rape, and Kick questionnaire    Fear of Current or Ex-Partner: No    Emotionally Abused: No    Physically Abused: No    Sexually Abused: No    Review of Systems: See HPI, otherwise negative ROS  Physical Exam: BP (!) 160/98   Pulse (!) 121   Temp (!) 96.8 F (36 C) (Temporal)    Resp 18   Ht '5\' 5"'$  (1.651 m)   Wt 64.4 kg   SpO2 97%   BMI 23.63 kg/m  General:   Alert,  pleasant and cooperative in NAD Head:  Normocephalic and atraumatic. Neck:  Supple; no masses or thyromegaly. Lungs:  Clear throughout to auscultation, normal respiratory effort.    Heart:  +S1, +S2, Regular rate and rhythm, No  edema. Abdomen:  Soft, nontender and nondistended. Normal bowel sounds, without guarding, and without rebound.   Neurologic:  Alert and  oriented x4;  grossly normal neurologically.  Impression/Plan: Elizabeth Johnson is here for an colonoscopy to be performed for positive cologuard.Risks, benefits, limitations, and alternatives regarding  colonoscopy have been reviewed with the patient.  Questions have been answered.  All parties agreeable.   [name], MD  11/03/2021, 10:27 AM

## 2021-11-03 NOTE — Anesthesia Postprocedure Evaluation (Signed)
Anesthesia Post Note  Patient: Elizabeth Johnson  Procedure(s) Performed: COLONOSCOPY WITH PROPOFOL  Patient location during evaluation: Endoscopy Anesthesia Type: General Level of consciousness: awake and alert Pain management: pain level controlled Vital Signs Assessment: post-procedure vital signs reviewed and stable Respiratory status: spontaneous breathing, nonlabored ventilation, respiratory function stable and patient connected to nasal cannula oxygen Cardiovascular status: blood pressure returned to baseline and stable Postop Assessment: no apparent nausea or vomiting Anesthetic complications: no   No notable events documented.   Last Vitals:  Vitals:   11/03/21 1050 11/03/21 1100  BP: 130/85 (!) 119/99  Pulse: (!) 111 (!) 105  Resp: 20 (!) 22  Temp: (!) 35.7 C   SpO2: 95% 99%    Last Pain:  Vitals:   11/03/21 1100  TempSrc:   PainSc: 0-No pain                 Dimas Millin

## 2021-11-06 ENCOUNTER — Encounter: Payer: Self-pay | Admitting: Gastroenterology

## 2021-11-06 LAB — SURGICAL PATHOLOGY

## 2021-11-20 ENCOUNTER — Ambulatory Visit (INDEPENDENT_AMBULATORY_CARE_PROVIDER_SITE_OTHER): Payer: No Typology Code available for payment source | Admitting: Family Medicine

## 2021-11-20 ENCOUNTER — Encounter: Payer: Self-pay | Admitting: Family Medicine

## 2021-11-20 ENCOUNTER — Ambulatory Visit: Payer: No Typology Code available for payment source | Admitting: Family Medicine

## 2021-11-20 VITALS — BP 122/82 | HR 96 | Temp 98.2°F | Resp 16 | Ht 62.5 in | Wt 147.3 lb

## 2021-11-20 DIAGNOSIS — Z13820 Encounter for screening for osteoporosis: Secondary | ICD-10-CM

## 2021-11-20 DIAGNOSIS — Z79899 Other long term (current) drug therapy: Secondary | ICD-10-CM

## 2021-11-20 DIAGNOSIS — E785 Hyperlipidemia, unspecified: Secondary | ICD-10-CM

## 2021-11-20 DIAGNOSIS — Z01419 Encounter for gynecological examination (general) (routine) without abnormal findings: Secondary | ICD-10-CM

## 2021-11-20 DIAGNOSIS — R739 Hyperglycemia, unspecified: Secondary | ICD-10-CM | POA: Diagnosis not present

## 2021-11-20 DIAGNOSIS — M8588 Other specified disorders of bone density and structure, other site: Secondary | ICD-10-CM

## 2021-11-20 DIAGNOSIS — E538 Deficiency of other specified B group vitamins: Secondary | ICD-10-CM | POA: Diagnosis not present

## 2021-11-20 DIAGNOSIS — Z1231 Encounter for screening mammogram for malignant neoplasm of breast: Secondary | ICD-10-CM

## 2021-11-20 NOTE — Progress Notes (Signed)
Name: Elizabeth Johnson   MRN: [id number]    DOB: 1950-06-05   Date:11/20/2021       Progress Note  Subjective  Chief Complaint  Annual Exam   HPI  Patient presents for annual CPE.  Diet: balanced  Exercise: discussed importance of regular activity   Last Eye Exam: up to date  Last Dental Exam: needs to go back   [employer] Visit from [facility]/03/[facility] in [facility]  AUDIT-C Score 4      Depression: Phq 9 is  positive - she will return to adjust medication     11/20/2021    3:08 PM 04/11/2021   11:18 AM 09/08/2020    9:57 AM 08/03/2020   10:47 AM 03/10/2020   10:47 AM  Depression screen PHQ 2/9  Decreased Interest 2 0 0 0 0  Down, Depressed, Hopeless 2 3 0 1 0  PHQ - 2 Score 4 3 0 1 0  Altered sleeping 2 0 0 1   Tired, decreased energy 2 0 0 0   Change in appetite 0 0 0 0   Feeling bad or failure about yourself  0 0 0 0   Trouble concentrating 0 0 0 0   Moving slowly or fidgety/restless 2 0 0 0   Suicidal thoughts 0 0 0 0   PHQ-9 Score 10 3 0 2   Difficult doing work/chores Somewhat difficult       Hypertension: BP Readings from Last 3 Encounters:  11/20/21 122/82  11/03/21 109/76  04/11/21 126/84   Obesity: Wt Readings from Last 3 Encounters:  11/20/21 147 lb 4.8 oz (66.8 kg)  11/03/21 142 lb (64.4 kg)  04/11/21 146 lb (66.2 kg)   BMI Readings from Last 3 Encounters:  11/20/21 26.51 kg/m  11/03/21 23.63 kg/m  04/11/21 26.70 kg/m     Vaccines:   RSV: discussed it  Tdap: discussed it to get it at local pharmacy  Shingrix: up to date we will get records from [place]  Pneumonia:  up to date  Flu: done at local pharmacy  COVID-19:not interested    Hep C Screening:  STD testing and prevention (HIV/chl/gon/syphilis): not interested  Intimate partner violence: negative screen  Sexual History : no problems  Menstrual History/LMP/Abnormal Bleeding: hysterectomy  Discussed importance of follow up if any  post-menopausal bleeding: not applicable  Incontinence Symptoms: negative for symptoms   Breast cancer:  - Last Mammogram: she is due for mammogram  - BRCA gene screening: N/A  Osteoporosis Prevention : Discussed high calcium and vitamin D supplementation, weight bearing exercises Bone density :no   Cervical cancer screening:   Skin cancer: Discussed monitoring for atypical lesions  Colorectal cancer: up to date    Lung cancer:  Low Dose CT Chest recommended if Age 92-80 years, 20 pack-year currently smoking OR have quit w/in 15years. Patient does not qualify for screen   ECG: 2018   Advanced Care Planning: A voluntary discussion about advance care planning including the explanation and discussion of advance directives.  Discussed health care proxy and Living will, and the patient was able to identify a health care proxy as [name] .  Patient does not have a living will and power of attorney of health care   Lipids: Lab Results  Component Value Date   CHOL 186 08/03/2020   CHOL 188 05/05/2019   CHOL 255 (H) 09/10/2017   Lab Results  Component Value Date   HDL 68 08/03/2020  HDL 66 05/05/2019   HDL 51 09/10/2017   Lab Results  Component Value Date   LDLCALC 90 08/03/2020   LDLCALC 95 05/05/2019   LDLCALC 164 (H) 09/10/2017   Lab Results  Component Value Date   TRIG 188 (H) 08/03/2020   TRIG 173 (H) 05/05/2019   TRIG 238 (H) 09/10/2017   Lab Results  Component Value Date   CHOLHDL 2.7 08/03/2020   CHOLHDL 2.8 05/05/2019   CHOLHDL 5.0 (H) 09/10/2017   No results found for: "LDLDIRECT"  Glucose: Glucose, Bld  Date Value Ref Range Status  08/03/2020 119 (H) 65 - 99 mg/dL Final    Comment:    .            Fasting reference interval . For someone without known diabetes, a glucose value between 100 and 125 mg/dL is consistent with prediabetes and should be confirmed with a follow-up test. .   05/05/2019 130 (H) 65 - 99 mg/dL Final    Comment:    .             Fasting reference interval . For someone without known diabetes, a glucose value >125 mg/dL indicates that they may have diabetes and this should be confirmed with a follow-up test. .   09/10/2017 106 (H) 65 - 99 mg/dL Final    Comment:    .            Fasting reference interval . For someone without known diabetes, a glucose value between 100 and 125 mg/dL is consistent with prediabetes and should be confirmed with a follow-up test. .    Glucose-Capillary  Date Value Ref Range Status  04/15/2017 104 (H) 65 - 99 mg/dL Final    Patient Active Problem List   Diagnosis Date Noted   Positive colorectal cancer screening using Cologuard test    Adenomatous polyp of colon    Senile purpura ([place]) 09/08/2020   Angina pectoris syndrome ([place]) 04/29/2019   Trigger thumb of right hand 12/12/2017   Post-traumatic osteoarthritis of right wrist 12/12/2017   Biceps tendon rupture 09/21/2015   H/O hysterectomy for benign disease 09/02/2014   Acquired bilateral hammer toes 09/02/2014   External hemorrhoid 09/02/2014   Allergic rhinitis 07/19/2014   Carpal tunnel syndrome on right 07/19/2014   Insomnia, persistent 07/19/2014   Chronic LBP 07/19/2014   Mild major depression ([place]) 07/19/2014   Dyslipidemia 07/19/2014   Hemorrhoid 81/85/6314   Dysmetabolic syndrome 97/02/6376   Coronary artery disease involving left main coronary artery 07/19/2014   Osteopenia 07/19/2014   Benign essential HTN 02/04/2009   H/O acute myocardial infarction of lateral wall 07/18/2004    Past Surgical History:  Procedure Laterality Date   ABDOMINAL HYSTERECTOMY     BUNIONECTOMY Bilateral 1973   CARPAL TUNNEL RELEASE Right 04/15/2017   Procedure: CARPAL TUNNEL RELEASE;  Surgeon: Earnestine Leys, MD;  Location: [facility] ORS;  Service: Orthopedics;  Laterality: Right;   CATARACT EXTRACTION, BILATERAL  05/21 & 06/21   COLONOSCOPY WITH PROPOFOL N/A 11/23/2016   Procedure: COLONOSCOPY WITH PROPOFOL;   Surgeon: [name], MD;  Location: [facility] ENDOSCOPY;  Service: Gastroenterology;  Laterality: N/A;   COLONOSCOPY WITH PROPOFOL N/A 11/03/2021   Procedure: COLONOSCOPY WITH PROPOFOL;  Surgeon: [name], MD;  Location: [facility] ENDOSCOPY;  Service: Gastroenterology;  Laterality: N/A;   CORONARY STENT PLACEMENT  2009   ELBOW SURGERY Left 1977   HAMMER TOE SURGERY Right 1992   RIB FRACTURE SURGERY     WRIST FRACTURE SURGERY  Right 1977    Family History  Problem Relation Age of Onset   Alzheimer's disease Mother    Breast cancer Mother 74   CAD Father    Heart attack Father    Heart failure Father    COPD Father    Drug abuse Sister    Suicidality Sister    Heart attack Brother     Social History   Socioeconomic History   Marital status: Significant Other    Spouse name: Not on file   Number of children: 1   Years of education: Not on file   Highest education level: Not on file  Occupational History   Occupation: retired   Tobacco Use   Smoking status: Former    Packs/day: 0.25    Years: 8.00    Total pack years: 2.00    Types: Cigarettes    Start date: 01/23/1999    Quit date: 05/23/2007    Years since quitting: 14.5   Smokeless tobacco: Never  Vaping Use   Vaping Use: Never used  Substance and Sexual Activity   Alcohol use: Yes    Alcohol/week: 0.0 standard drinks of alcohol    Comment: occasional   Drug use: No   Sexual activity: Yes    Partners: Male  Other Topics Concern   Not on file  Social History Narrative   She worked for the same company for 20 years and they closed [place] branch and she was forced to retire Fall 2017. When her position was moved to [facility] with fiance for almost 30 years    She lost two of her dogs within 8 months, last dog died 2021/05/29 and she is still grieving.    Social Determinants of Health   Financial Resource Strain: Low Risk  (11/20/2021)   Overall Financial Resource Strain (CARDIA)    Difficulty of Paying Living  Expenses: Not hard at all  Food Insecurity: No Food Insecurity (11/20/2021)   Hunger Vital Sign    Worried About Running Out of Food in the Last Year: Never true    Ran Out of Food in the Last Year: Never true  Transportation Needs: No Transportation Needs (11/20/2021)   PRAPARE - Hydrologist (Medical): No    Lack of Transportation (Non-Medical): No  Physical Activity: Insufficiently Active (11/20/2021)   Exercise Vital Sign    Days of Exercise per Week: 2 days    Minutes of Exercise per Session: 20 min  Stress: Stress Concern Present (11/20/2021)   [place]    Feeling of Stress : To some extent  Social Connections: Moderately Isolated (11/20/2021)   Social Connection and Isolation Panel [NHANES]    Frequency of Communication with Friends and Family: Twice a week    Frequency of Social Gatherings with Friends and Family: Once a week    Attends Religious Services: Never    Marine scientist or Organizations: No    Attends Archivist Meetings: Never    Marital Status: Living with partner  Intimate Partner Violence: Not At Risk (11/20/2021)   Humiliation, Afraid, Rape, and Kick questionnaire    Fear of Current or Ex-Partner: No    Emotionally Abused: No    Physically Abused: No    Sexually Abused: No     Current Outpatient Medications:    ALPRAZolam (XANAX) 0.5 MG tablet, TAKE 1 TABLET BY MOUTH  DAILY AS NEEDED FOR  ANXIETY, Disp: 30 tablet, Rfl: 0   aspirin 81 MG chewable tablet, Chew 81 mg by mouth daily. , Disp: , Rfl:    buPROPion (WELLBUTRIN XL) 300 MG 24 hr tablet, TAKE 1 TABLET DAILY, Disp: 90 tablet, Rfl: 1   carvedilol (COREG) 3.125 MG tablet, Take 1 tablet (3.125 mg total) by mouth 2 (two) times daily with a meal., Disp: 180 tablet, Rfl: 0   cyclobenzaprine (FLEXERIL) 5 MG tablet, Take 1 tablet (5 mg total) by mouth at bedtime., Disp: 90 tablet, Rfl: 0    ezetimibe (ZETIA) 10 MG tablet, Take 1 tablet (10 mg total) by mouth daily., Disp: 90 tablet, Rfl: 1   lisinopril (ZESTRIL) 20 MG tablet, Take 1 tablet (20 mg total) by mouth daily., Disp: 90 tablet, Rfl: 0   Na Sulfate-K Sulfate-Mg Sulf 17.5-3.13-1.6 GM/177ML SOLN, Take by mouth., Disp: , Rfl:    nitroGLYCERIN (NITROSTAT) 0.4 MG SL tablet, DISSOLVE 1 TABLET UNDER THE TONGUE EVERY 5 MINUTES AS  NEEDED FOR CHEST PAIN. MAX  OF 3 TABLETS IN 15 MINUTES. CALL 911 IF PAIN PERSISTS., Disp: 100 tablet, Rfl: 3   rosuvastatin (CRESTOR) 20 MG tablet, Take 1 tablet (20 mg total) by mouth daily., Disp: 90 tablet, Rfl: 0   temazepam (RESTORIL) 15 MG capsule, Take 15 mg by mouth at bedtime as needed for sleep., Disp: , Rfl:    cholecalciferol (VITAMIN D3) 25 MCG (1000 UNIT) tablet, Take 1,000 Units by mouth daily. (Patient not taking: Reported on 11/03/2021), Disp: , Rfl:    hydrOXYzine (ATARAX) 10 MG tablet, TAKE 1 TO 2 TABLETS BY  MOUTH 3 TIMES DAILY AS  NEEDED (Patient not taking: Reported on 11/20/2021), Disp: 90 tablet, Rfl: 0  Allergies  Allergen Reactions   Codeine Other (See Comments)    Sensitivity. Nauseated      ROS  Constitutional: Negative for fever or weight change.  Respiratory: Negative for cough and shortness of breath.  Tinnitus bilaterally but lately can only hear it on the right side, seen by ENT in the past but getting louder lately  Cardiovascular: Negative for chest pain or palpitations.  Gastrointestinal: Negative for abdominal pain, no bowel changes.  Musculoskeletal: Negative for gait problem or joint swelling.  Skin: Negative for rash.  Neurological: Negative for dizziness or headache.  No other specific complaints in a complete review of systems (except as listed in HPI above).   Objective  Vitals:   11/20/21 1504  BP: 122/82  Pulse: 96  Resp: 16  Temp: 98.2 F (36.8 C)  TempSrc: Oral  SpO2: 94%  Weight: 147 lb 4.8 oz (66.8 kg)  Height: 5' 2.5" (1.588 m)    Body  mass index is 26.51 kg/m.  Physical Exam  Constitutional: Patient appears well-developed and well-nourished. No distress.  HENT: Head: Normocephalic and atraumatic. Ears: B TMs ok, no erythema or effusion; Nose: Nose normal. Mouth/Throat: Oropharynx is clear and moist. No oropharyngeal exudate.  Eyes: Conjunctivae and EOM are normal. Pupils are equal, round, and reactive to light. No scleral icterus.  Neck: Normal range of motion. Neck supple. No JVD present. No thyromegaly present.  Cardiovascular: Normal rate, regular rhythm and normal heart sounds.  No murmur heard. No BLE edema. Pulmonary/Chest: Effort normal and breath sounds normal. No respiratory distress. Abdominal: Soft. Bowel sounds are normal, no distension. There is no tenderness. no masses Breast: no lumps or masses, no nipple discharge or rashes FEMALE GENITALIA:  Not done  RECTAL: not done  Musculoskeletal: Normal range of motion,  no joint effusions. No gross deformities Neurological: he is alert and oriented to person, place, and time. No cranial nerve deficit. Coordination, balance, strength, speech and gait are normal.  Skin: Skin is warm and dry. No rash noted. No erythema.  Psychiatric: Patient has a normal mood and affect. behavior is normal. Judgment and thought content normal.   Recent Results (from the past 2160 hour(s))  Cologuard     Status: Abnormal   Collection Time: 09/26/21  9:15 AM  Result Value Ref Range   COLOGUARD Positive (A) Negative    Comment:  POSITIVE TEST RESULT. A positive Cologuard result should be followed with a colonoscopy or visual examination of the colon. The normal value (reference range) for this assay is negative.  TEST DESCRIPTION: Composite algorithmic analysis of stool DNA-biomarkers with hemoglobin immunoassay.   Quantitative values of individual biomarkers are not reportable and are not associated with individual biomarker result reference ranges. Cologuard is intended for  colorectal cancer screening of adults of either sex, 59 years or older, who are at average-risk for colorectal cancer (CRC). Cologuard has been approved for use by the U.S. FDA. The performance of Cologuard was established in a cross sectional study of average-risk adults aged 69-84. Cologuard performance in patients ages 29 to 83 years was estimated by sub-group analysis of near-age groups. Colonoscopies performed for a positive result may find as the most clinically significant lesion: colorectal cancer [4.0%], advanced adenoma  (including sessile serrated polyps greater than or equal to 1cm diameter) [20%] or non- advanced adenoma [31%]; or no colorectal neoplasia [45%]. These estimates are derived from a prospective cross-sectional screening study of 10,000 individuals at average risk for colorectal cancer who were screened with both Cologuard and colonoscopy. (Imperiale T. et al, Alison Stalling J Med 2014;370(14):1286-1297.) Cologuard may produce a false negative or false positive result (no colorectal cancer or precancerous polyp present at colonoscopy follow up). A negative Cologuard test result does not guarantee the absence of CRC or advanced adenoma (pre-cancer). The current Cologuard screening interval is every 3 years. Paramedic and [employer] developer). Cologuard performance data in a 10,000 patient pivotal study using colonoscopy as the reference method can be accessed at the following location: www.exactlabs.com/results. Additional description of the Cologuard test process,  warnings and precautions can be found at www.cologuard.com.   Surgical pathology     Status: None   Collection Time: 11/03/21 10:40 AM  Result Value Ref Range   SURGICAL PATHOLOGY      SURGICAL PATHOLOGY CASE: [id number] PATIENT: Elna Breslow Surgical Pathology Report     Specimen Submitted: A. Colon polyps x3, asc, cbx(2); cold snare(1)  Clinical History: Hx of colon polyps, positive  colorectal using Cologuard. Polyps and diverticulosis.      DIAGNOSIS: A.  COLON POLYPS X 3, ASCENDING; COLD BIOPSY (2) AND COLD SNARE (1): - TUBULAR ADENOMA (3). - NEGATIVE FOR HIGH-GRADE DYSPLASIA AND MALIGNANCY.  GROSS DESCRIPTION: A. Labeled: cbx x 2/cold snare x 1 ascending colon polyps Received: Formalin Collection time: 10:40 AM on 11/03/2021 Placed into formalin time: 10:40 AM on 11/03/2021 Tissue fragment(s): Multiple Size: Aggregate, 1.1 x 0.5 x 0.2 cm Description: Received are fragments of tan-pink soft tissue admixed with a fragment of intestinal debris.  The ratio of soft tissue to intestinal debris is 95: 5. Entirely submitted in 1 cassette.  RB 11/03/2021  Final Diagnosis performed by Quay Burow, MD.   Electronically signed 10/1 06/2021 12:40:54PM The electronic signature indicates that the named Attending Pathologist  has evaluated the specimen Technical component performed at [facility], [address] [place], [place] [address] Lab: [phone] Dir: Rush Farmer, MD, MMM  Professional component performed at [facility], [facility], [place], [place], [place] [address] Lab: [phone] Dir: Kathi Simpers, MD      Fall Risk:    11/20/2021    3:09 PM 04/11/2021   11:18 AM 09/08/2020    9:49 AM 08/03/2020   10:47 AM 03/10/2020   10:42 AM  Fall Risk   Falls in the past year? 0 0 0 0 1  Number falls in past yr:  0 0 0 1  Injury with Fall?  0 0 0 0  Risk for fall due to : No Fall Risks No Fall Risks No Fall Risks    Follow up Falls prevention discussed;Education provided;Falls evaluation completed Falls prevention discussed Falls prevention discussed       Functional Status Survey: Is the patient deaf or have difficulty hearing?: No Does the patient have difficulty seeing, even when wearing glasses/contacts?: No Does the patient have difficulty concentrating, remembering, or making decisions?: No Does the patient have  difficulty walking or climbing stairs?: No Does the patient have difficulty dressing or bathing?: No Does the patient have difficulty doing errands alone such as visiting a doctor's office or shopping?: No   Assessment & Plan  1. Well woman exam  She will return next week to discuss tinnitus and depression/grieving, she did not want to do a split visit due to cost  2. Breast cancer screening by mammogram  - MM Digital Screening; Future  3. Osteoporosis screening  - DG Bone Density; Future  4. Osteopenia of lumbar spine  - DG Bone Density; Future - VITAMIN D 25 Hydroxy (Vit-D Deficiency, Fractures) - Parathyroid hormone, intact (no Ca)  5. B12 deficiency  - Vitamin B12  6. Dyslipidemia  - Lipid panel  7. Hyperglycemia  - Hemoglobin A1c  8. Long-term use of high-risk medication  - COMPLETE METABOLIC PANEL WITH GFR - CBC with Differential/Platelet  There are no diagnoses linked to this encounter.  -USPSTF grade A and B recommendations reviewed with patient; age-appropriate recommendations, preventive care, screening tests, etc discussed and encouraged; healthy living encouraged; see AVS for patient education given to patient -Discussed importance of 150 minutes of physical activity weekly, eat two servings of fish weekly, eat one serving of tree nuts ( cashews, pistachios, pecans, almonds.Marland Kitchen) every other day, eat 6 servings of fruit/vegetables daily and drink plenty of water and avoid sweet beverages.   -Reviewed Health Maintenance: Yes.

## 2021-11-20 NOTE — Patient Instructions (Signed)
Check RSV and Tdap at local pharmacy

## 2021-11-21 LAB — HEMOGLOBIN A1C
Hgb A1c MFr Bld: 6.5 % of total Hgb — ABNORMAL HIGH (ref <5.7)
Mean Plasma Glucose: 140 mg/dL
eAG (mmol/L): 7.7 mmol/L

## 2021-11-21 LAB — CBC WITH DIFFERENTIAL/PLATELET
Absolute Monocytes: 918 cells/uL (ref 200–950)
Basophils Absolute: 67 cells/uL (ref 0–200)
Basophils Relative: 0.6 %
Eosinophils Absolute: 179 cells/uL (ref 15–500)
Eosinophils Relative: 1.6 %
HCT: 44.2 % (ref 35.0–45.0)
Hemoglobin: 15.1 g/dL (ref 11.7–15.5)
Lymphs Abs: 2341 cells/uL (ref 850–3900)
MCH: 30.4 pg (ref 27.0–33.0)
MCHC: 34.2 g/dL (ref 32.0–36.0)
MCV: 88.9 fL (ref 80.0–100.0)
MPV: 9.2 fL (ref 7.5–12.5)
Monocytes Relative: 8.2 %
Neutro Abs: 7694 cells/uL (ref 1500–7800)
Neutrophils Relative %: 68.7 %
Platelets: 308 10*3/uL (ref 140–400)
RBC: 4.97 10*6/uL (ref 3.80–5.10)
RDW: 12.8 % (ref 11.0–15.0)
Total Lymphocyte: 20.9 %
WBC: 11.2 10*3/uL — ABNORMAL HIGH (ref 3.8–10.8)

## 2021-11-21 LAB — LIPID PANEL
Cholesterol: 161 mg/dL (ref <200)
HDL: 65 mg/dL (ref >=50)
LDL Cholesterol (Calc): 71 mg/dL (calc)
Non-HDL Cholesterol (Calc): 96 mg/dL (calc) (ref <130)
Total CHOL/HDL Ratio: 2.5 (calc) (ref <5.0)
Triglycerides: 170 mg/dL — ABNORMAL HIGH (ref <150)

## 2021-11-21 LAB — COMPLETE METABOLIC PANEL WITH GFR
AG Ratio: 1.7 (calc) (ref 1.0–2.5)
ALT: 25 U/L (ref 6–29)
AST: 35 U/L (ref 10–35)
Albumin: 4.9 g/dL (ref 3.6–5.1)
Alkaline phosphatase (APISO): 72 U/L (ref 37–153)
BUN: 14 mg/dL (ref 7–25)
CO2: 24 mmol/L (ref 20–32)
Calcium: 10 mg/dL (ref 8.6–10.4)
Chloride: 104 mmol/L (ref 98–110)
Creat: 0.85 mg/dL (ref 0.60–1.00)
Globulin: 2.9 g/dL (calc) (ref 1.9–3.7)
Glucose, Bld: 113 mg/dL — ABNORMAL HIGH (ref 65–99)
Potassium: 4.4 mmol/L (ref 3.5–5.3)
Sodium: 141 mmol/L (ref 135–146)
Total Bilirubin: 0.7 mg/dL (ref 0.2–1.2)
Total Protein: 7.8 g/dL (ref 6.1–8.1)
eGFR: 74 mL/min/{1.73_m2} (ref >=60)

## 2021-11-21 LAB — VITAMIN B12: Vitamin B-12: 494 pg/mL (ref 200–1100)

## 2021-11-21 LAB — PARATHYROID HORMONE, INTACT (NO CA): PTH: 80 pg/mL — ABNORMAL HIGH (ref 16–77)

## 2021-11-21 LAB — VITAMIN D 25 HYDROXY (VIT D DEFICIENCY, FRACTURES): Vit D, 25-Hydroxy: 37 ng/mL (ref 30–100)

## 2021-11-28 ENCOUNTER — Other Ambulatory Visit: Payer: Self-pay | Admitting: Family Medicine

## 2021-11-28 DIAGNOSIS — Z1231 Encounter for screening mammogram for malignant neoplasm of breast: Secondary | ICD-10-CM

## 2021-11-28 NOTE — Progress Notes (Unsigned)
Name: Elizabeth Johnson   MRN: [id number]    DOB: April 06, 1950   Date:11/29/2021       Progress Note  Subjective  Chief Complaint  Consult  I connected with  [name]  on 11/29/21 at 11:00 AM EST by a video enabled telemedicine application and verified that I am speaking with the correct person using two identifiers.  I discussed the limitations of evaluation and management by telemedicine and the availability of in person appointments. The patient expressed understanding and agreed to proceed with the virtual visit  Staff also discussed with the patient that there may be a patient responsible charge related to this service. Patient Location: at home  Provider Location: [facility] Additional Individuals present: alone   HPI  Hyperlipidemia: she is currently on Crestor and Zetia, tolerating it well, getting Zetia through good RX, last LDL was at goal    HTN: she is compliant with  medication and bp is at goal .  Currently no chest pain , palpitation or SOB. Continue current medications    Major Depression:  She is taking Wellbutrin XL 300 mg daily, Temazpam prn for sleep and very seldom she takes alprazolam for anxiety. She is still grieving the death of both of her dogs - one died 12-Oct-2020, Chloe died in 04-11-21 and she is still grieving but gradually feeling better    Back pain: she has mild low back pain at this time but sometimes has a flare and the pain can be intense but never has radiculitis. Described as spasms, no radiculitis. She takes Flexeril prn when she has spasms    Angina pectoris: history of MI, denies recent chest pain,on beta-blocker, statin therapy recently also on Zetia ( too costly and we gave her GoodRx voucher)  and ACE, plus aspirin . Used to see Dr Clayborn Bigness but she states released from his care for now.  On medical management , does not needs NTG today.    Hyperglycemia: denies polyphagia, polydipsia or polyuria, A1C went up to 6.5 %   Senile Purpura: she states doing  well lately.   Osteopenia and elevated Pth: discussed referral to Endo  or repeat labs and wait for bone density and she chose the later  Elevated white count: she will return for labs   Patient Active Problem List   Diagnosis Date Noted   Positive colorectal cancer screening using Cologuard test    Adenomatous polyp of colon    Senile purpura ([place]) 09/08/2020   Angina pectoris syndrome ([place]) 04/29/2019   Trigger thumb of right hand 12/12/2017   Post-traumatic osteoarthritis of right wrist 12/12/2017   Biceps tendon rupture 09/21/2015   H/O hysterectomy for benign disease 09/02/2014   Acquired bilateral hammer toes 09/02/2014   External hemorrhoid 09/02/2014   Allergic rhinitis 07/19/2014   Carpal tunnel syndrome on right 07/19/2014   Insomnia, persistent 07/19/2014   Chronic LBP 07/19/2014   Mild major depression ([place]) 07/19/2014   Dyslipidemia 07/19/2014   Hemorrhoid 76/19/5093   Dysmetabolic syndrome 26/71/2458   Coronary artery disease involving left main coronary artery 07/19/2014   Osteopenia 07/19/2014   Benign essential HTN 02/04/2009   H/O acute myocardial infarction of lateral wall 07/18/2004    Past Surgical History:  Procedure Laterality Date   ABDOMINAL HYSTERECTOMY     BUNIONECTOMY Bilateral Apr 12, 1971   CARPAL TUNNEL RELEASE Right 04/15/2017   Procedure: CARPAL TUNNEL RELEASE;  Surgeon: Earnestine Leys, MD;  Location: [facility] ORS;  Service: Orthopedics;  Laterality: Right;  CATARACT EXTRACTION, BILATERAL  05/21 & 06/21   COLONOSCOPY WITH PROPOFOL N/A 11/23/2016   Procedure: COLONOSCOPY WITH PROPOFOL;  Surgeon: [name], MD;  Location: [facility] ENDOSCOPY;  Service: Gastroenterology;  Laterality: N/A;   COLONOSCOPY WITH PROPOFOL N/A 11/03/2021   Procedure: COLONOSCOPY WITH PROPOFOL;  Surgeon: [name], MD;  Location: [facility] ENDOSCOPY;  Service: Gastroenterology;  Laterality: N/A;   CORONARY STENT PLACEMENT  2009   ELBOW SURGERY Left 1977   HAMMER TOE SURGERY Right 1992    RIB FRACTURE SURGERY     WRIST FRACTURE SURGERY Right 72    Family History  Problem Relation Age of Onset   Alzheimer's disease Mother    Breast cancer Mother 66   CAD Father    Heart attack Father    Heart failure Father    COPD Father    Drug abuse Sister    Suicidality Sister    Heart attack Brother     Social History   Socioeconomic History   Marital status: Significant Other    Spouse name: Not on file   Number of children: 1   Years of education: Not on file   Highest education level: Not on file  Occupational History   Occupation: retired   Tobacco Use   Smoking status: Former    Packs/day: 0.25    Years: 8.00    Total pack years: 2.00    Types: Cigarettes    Start date: 01/23/1999    Quit date: 05/23/2007    Years since quitting: 14.5   Smokeless tobacco: Never  Vaping Use   Vaping Use: Never used  Substance and Sexual Activity   Alcohol use: Yes    Alcohol/week: 0.0 standard drinks of alcohol    Comment: occasional   Drug use: No   Sexual activity: Yes    Partners: Male  Other Topics Concern   Not on file  Social History Narrative   She worked for the same company for 20 years and they closed [place] branch and she was forced to retire Fall 2017. When her position was moved to [facility] with fiance for almost 30 years    She lost two of her dogs within 8 months, last dog died 21-Jun-2021 and she is still grieving.    Social Determinants of Health   Financial Resource Strain: Low Risk  (11/20/2021)   Overall Financial Resource Strain (CARDIA)    Difficulty of Paying Living Expenses: Not hard at all  Food Insecurity: No Food Insecurity (11/20/2021)   Hunger Vital Sign    Worried About Running Out of Food in the Last Year: Never true    Ran Out of Food in the Last Year: Never true  Transportation Needs: No Transportation Needs (11/20/2021)   PRAPARE - Hydrologist (Medical): No    Lack of Transportation (Non-Medical):  No  Physical Activity: Insufficiently Active (11/20/2021)   Exercise Vital Sign    Days of Exercise per Week: 2 days    Minutes of Exercise per Session: 20 min  Stress: Stress Concern Present (11/20/2021)   [place]    Feeling of Stress : To some extent  Social Connections: Moderately Isolated (11/20/2021)   Social Connection and Isolation Panel [NHANES]    Frequency of Communication with Friends and Family: Twice a week    Frequency of Social Gatherings with Friends and Family: Once a week    Attends Religious Services:  Never    Active Member of Clubs or Organizations: No    Attends Archivist Meetings: Never    Marital Status: Living with partner  Intimate Partner Violence: Not At Risk (11/20/2021)   Humiliation, Afraid, Rape, and Kick questionnaire    Fear of Current or Ex-Partner: No    Emotionally Abused: No    Physically Abused: No    Sexually Abused: No     Current Outpatient Medications:    ALPRAZolam (XANAX) 0.5 MG tablet, TAKE 1 TABLET BY MOUTH  DAILY AS NEEDED FOR ANXIETY, Disp: 30 tablet, Rfl: 0   aspirin 81 MG chewable tablet, Chew 81 mg by mouth daily. , Disp: , Rfl:    buPROPion (WELLBUTRIN XL) 300 MG 24 hr tablet, TAKE 1 TABLET DAILY, Disp: 90 tablet, Rfl: 1   carvedilol (COREG) 3.125 MG tablet, Take 1 tablet (3.125 mg total) by mouth 2 (two) times daily with a meal., Disp: 180 tablet, Rfl: 0   cholecalciferol (VITAMIN D3) 25 MCG (1000 UNIT) tablet, Take 1,000 Units by mouth daily., Disp: , Rfl:    cyclobenzaprine (FLEXERIL) 5 MG tablet, Take 1 tablet (5 mg total) by mouth at bedtime., Disp: 90 tablet, Rfl: 0   ezetimibe (ZETIA) 10 MG tablet, Take 1 tablet (10 mg total) by mouth daily., Disp: 90 tablet, Rfl: 1   lisinopril (ZESTRIL) 20 MG tablet, Take 1 tablet (20 mg total) by mouth daily., Disp: 90 tablet, Rfl: 0   Na Sulfate-K Sulfate-Mg Sulf 17.5-3.13-1.6 GM/177ML SOLN, Take by mouth.,  Disp: , Rfl:    nitroGLYCERIN (NITROSTAT) 0.4 MG SL tablet, DISSOLVE 1 TABLET UNDER THE TONGUE EVERY 5 MINUTES AS  NEEDED FOR CHEST PAIN. MAX  OF 3 TABLETS IN 15 MINUTES. CALL 911 IF PAIN PERSISTS., Disp: 100 tablet, Rfl: 3   rosuvastatin (CRESTOR) 20 MG tablet, Take 1 tablet (20 mg total) by mouth daily., Disp: 90 tablet, Rfl: 0   temazepam (RESTORIL) 15 MG capsule, Take 15 mg by mouth at bedtime as needed for sleep., Disp: , Rfl:    hydrOXYzine (ATARAX) 10 MG tablet, TAKE 1 TO 2 TABLETS BY  MOUTH 3 TIMES DAILY AS  NEEDED (Patient not taking: Reported on 11/20/2021), Disp: 90 tablet, Rfl: 0  Allergies  Allergen Reactions   Codeine Other (See Comments)    Sensitivity. Nauseated     I personally reviewed active problem list, medication list, allergies, family history, social history, health maintenance with the patient/caregiver today.   ROS  Ten systems reviewed and is negative except as mentioned in HPI   Objective  Virtual encounter, vitals not obtained.  There is no height or weight on file to calculate BMI.  Physical Exam  Awake alert and oriented   PHQ2/9:    11/29/2021   10:42 AM 11/20/2021    3:08 PM 04/11/2021   11:18 AM 09/08/2020    9:57 AM 08/03/2020   10:47 AM  Depression screen PHQ 2/9  Decreased Interest 1 2 0 0 0  Down, Depressed, Hopeless '2 2 3 '$ 0 1  PHQ - 2 Score '3 4 3 '$ 0 1  Altered sleeping 0 2 0 0 1  Tired, decreased energy 0 2 0 0 0  Change in appetite 0 0 0 0 0  Feeling bad or failure about yourself  0 0 0 0 0  Trouble concentrating 0 0 0 0 0  Moving slowly or fidgety/restless 0 2 0 0 0  Suicidal thoughts 0 0 0 0 0  PHQ-9 Score 3  10 3 0 2  Difficult doing work/chores  Somewhat difficult      PHQ-2/9 Result is positive.    Fall Risk:    11/29/2021   10:41 AM 11/20/2021    3:09 PM 04/11/2021   11:18 AM 09/08/2020    9:49 AM 08/03/2020   10:47 AM  Fall Risk   Falls in the past year? 0 0 0 0 0  Number falls in past yr: 0  0 0 0  Injury with Fall?  0  0 0 0  Risk for fall due to : No Fall Risks No Fall Risks No Fall Risks No Fall Risks   Follow up Falls prevention discussed Falls prevention discussed;Education provided;Falls evaluation completed Falls prevention discussed Falls prevention discussed      Assessment & Plan  1. Senile purpura (HCC)  Stable   2. Angina pectoris syndrome ([place])  On medical management   3. Mild major depression (HCC)  - buPROPion (WELLBUTRIN XL) 300 MG 24 hr tablet; Take 1 tablet (300 mg total) by mouth daily.  Dispense: 90 tablet; Refill: 1  4. Osteopenia of lumbar spine  She scheduled bone density  5. Benign essential HTN  - lisinopril (ZESTRIL) 20 MG tablet; Take 1 tablet (20 mg total) by mouth daily.  Dispense: 90 tablet; Refill: 1 - carvedilol (COREG) 3.125 MG tablet; Take 1 tablet (3.125 mg total) by mouth 2 (two) times daily with a meal.  Dispense: 180 tablet; Refill: 1  6. Coronary artery disease involving left main coronary artery  - ezetimibe (ZETIA) 10 MG tablet; Take 1 tablet (10 mg total) by mouth daily.  Dispense: 90 tablet; Refill: 1 - lisinopril (ZESTRIL) 20 MG tablet; Take 1 tablet (20 mg total) by mouth daily.  Dispense: 90 tablet; Refill: 1 - rosuvastatin (CRESTOR) 20 MG tablet; Take 1 tablet (20 mg total) by mouth daily.  Dispense: 90 tablet; Refill: 1 - carvedilol (COREG) 3.125 MG tablet; Take 1 tablet (3.125 mg total) by mouth 2 (two) times daily with a meal.  Dispense: 180 tablet; Refill: 1  7. Hyperglycemia   8. Dyslipidemia  - ezetimibe (ZETIA) 10 MG tablet; Take 1 tablet (10 mg total) by mouth daily.  Dispense: 90 tablet; Refill: 1 - rosuvastatin (CRESTOR) 20 MG tablet; Take 1 tablet (20 mg total) by mouth daily.  Dispense: 90 tablet; Refill: 1  9. Other insomnia  - ALPRAZolam (XANAX) 0.5 MG tablet; Take 1 tablet (0.5 mg total) by mouth daily as needed. for anxiety  Dispense: 30 tablet; Refill: 0  10. Intermittent low back pain  - cyclobenzaprine (FLEXERIL) 5  MG tablet; Take 1 tablet (5 mg total) by mouth at bedtime.  Dispense: 90 tablet; Refill: 0  11. Elevated parathyroid hormone  - PTH, intact and calcium She will return for repeat labs and we will refer her to endo if needed  12. Leukocytosis, unspecified type  - CBC with Differential/Platelet   I discussed the assessment and treatment plan with the patient. The patient was provided an opportunity to ask questions and all were answered. The patient agreed with the plan and demonstrated an understanding of the instructions.  The patient was advised to call back or seek an in-person evaluation if the symptoms worsen or if the condition fails to improve as anticipated.  I provided 25  minutes of non-face-to-face time during this encounter.

## 2021-11-29 ENCOUNTER — Telehealth (INDEPENDENT_AMBULATORY_CARE_PROVIDER_SITE_OTHER): Payer: No Typology Code available for payment source | Admitting: Family Medicine

## 2021-11-29 ENCOUNTER — Encounter: Payer: Self-pay | Admitting: Family Medicine

## 2021-11-29 DIAGNOSIS — G4709 Other insomnia: Secondary | ICD-10-CM | POA: Diagnosis not present

## 2021-11-29 DIAGNOSIS — E785 Hyperlipidemia, unspecified: Secondary | ICD-10-CM

## 2021-11-29 DIAGNOSIS — M8588 Other specified disorders of bone density and structure, other site: Secondary | ICD-10-CM | POA: Diagnosis not present

## 2021-11-29 DIAGNOSIS — F320 Major depressive disorder, single episode, mild: Secondary | ICD-10-CM | POA: Diagnosis not present

## 2021-11-29 DIAGNOSIS — I10 Essential (primary) hypertension: Secondary | ICD-10-CM | POA: Diagnosis not present

## 2021-11-29 DIAGNOSIS — I2510 Atherosclerotic heart disease of native coronary artery without angina pectoris: Secondary | ICD-10-CM

## 2021-11-29 DIAGNOSIS — I209 Angina pectoris, unspecified: Secondary | ICD-10-CM

## 2021-11-29 DIAGNOSIS — R7989 Other specified abnormal findings of blood chemistry: Secondary | ICD-10-CM | POA: Diagnosis not present

## 2021-11-29 DIAGNOSIS — M545 Low back pain, unspecified: Secondary | ICD-10-CM

## 2021-11-29 DIAGNOSIS — R739 Hyperglycemia, unspecified: Secondary | ICD-10-CM | POA: Diagnosis not present

## 2021-11-29 DIAGNOSIS — D692 Other nonthrombocytopenic purpura: Secondary | ICD-10-CM | POA: Diagnosis not present

## 2021-11-29 DIAGNOSIS — D72829 Elevated white blood cell count, unspecified: Secondary | ICD-10-CM | POA: Diagnosis not present

## 2021-11-29 DIAGNOSIS — E538 Deficiency of other specified B group vitamins: Secondary | ICD-10-CM | POA: Insufficient documentation

## 2021-11-29 MED ORDER — ALPRAZOLAM 0.5 MG PO TABS: 0.5000 mg | ORAL_TABLET | Freq: Every day | ORAL | 0 refills | Status: DC | PRN

## 2021-11-29 MED ORDER — ROSUVASTATIN CALCIUM 20 MG PO TABS: 20.0000 mg | ORAL_TABLET | Freq: Every day | ORAL | 1 refills | Status: DC

## 2021-11-29 MED ORDER — EZETIMIBE 10 MG PO TABS: 10.0000 mg | ORAL_TABLET | Freq: Every day | ORAL | 1 refills | Status: DC

## 2021-11-29 MED ORDER — BUPROPION HCL ER (XL) 300 MG PO TB24: 300.0000 mg | ORAL_TABLET | Freq: Every day | ORAL | 1 refills | Status: DC

## 2021-11-29 MED ORDER — CYCLOBENZAPRINE HCL 5 MG PO TABS: 5.0000 mg | ORAL_TABLET | Freq: Every day | ORAL | 0 refills | Status: DC

## 2021-11-29 MED ORDER — CARVEDILOL 3.125 MG PO TABS: 3.1250 mg | ORAL_TABLET | Freq: Two times a day (BID) | ORAL | 1 refills | Status: DC

## 2021-11-29 MED ORDER — LISINOPRIL 20 MG PO TABS: 20.0000 mg | ORAL_TABLET | Freq: Every day | ORAL | 1 refills | Status: DC

## 2021-11-29 MED ORDER — TEMAZEPAM 15 MG PO CAPS: 15.0000 mg | ORAL_CAPSULE | Freq: Every evening | ORAL | 0 refills | Status: DC | PRN

## 2021-12-25 DIAGNOSIS — G4700 Insomnia, unspecified: Secondary | ICD-10-CM | POA: Diagnosis not present

## 2021-12-25 DIAGNOSIS — Z79899 Other long term (current) drug therapy: Secondary | ICD-10-CM | POA: Diagnosis not present

## 2021-12-25 DIAGNOSIS — F419 Anxiety disorder, unspecified: Secondary | ICD-10-CM | POA: Diagnosis not present

## 2021-12-25 DIAGNOSIS — F320 Major depressive disorder, single episode, mild: Secondary | ICD-10-CM | POA: Diagnosis not present

## 2021-12-25 DIAGNOSIS — M1990 Unspecified osteoarthritis, unspecified site: Secondary | ICD-10-CM | POA: Diagnosis not present

## 2021-12-25 DIAGNOSIS — E663 Overweight: Secondary | ICD-10-CM | POA: Diagnosis not present

## 2021-12-25 DIAGNOSIS — I2510 Atherosclerotic heart disease of native coronary artery without angina pectoris: Secondary | ICD-10-CM | POA: Diagnosis not present

## 2021-12-25 DIAGNOSIS — Z008 Encounter for other general examination: Secondary | ICD-10-CM | POA: Diagnosis not present

## 2021-12-25 DIAGNOSIS — F17211 Nicotine dependence, cigarettes, in remission: Secondary | ICD-10-CM | POA: Diagnosis not present

## 2021-12-25 DIAGNOSIS — Z86010 Personal history of colonic polyps: Secondary | ICD-10-CM | POA: Diagnosis not present

## 2021-12-25 DIAGNOSIS — I129 Hypertensive chronic kidney disease with stage 1 through stage 4 chronic kidney disease, or unspecified chronic kidney disease: Secondary | ICD-10-CM | POA: Diagnosis not present

## 2021-12-25 DIAGNOSIS — E785 Hyperlipidemia, unspecified: Secondary | ICD-10-CM | POA: Diagnosis not present

## 2021-12-25 DIAGNOSIS — E1169 Type 2 diabetes mellitus with other specified complication: Secondary | ICD-10-CM | POA: Diagnosis not present

## 2021-12-25 DIAGNOSIS — Z6825 Body mass index (BMI) 25.0-25.9, adult: Secondary | ICD-10-CM | POA: Diagnosis not present

## 2021-12-25 DIAGNOSIS — N182 Chronic kidney disease, stage 2 (mild): Secondary | ICD-10-CM | POA: Diagnosis not present

## 2022-01-08 ENCOUNTER — Other Ambulatory Visit: Payer: Self-pay | Admitting: Family Medicine

## 2022-01-08 DIAGNOSIS — I2510 Atherosclerotic heart disease of native coronary artery without angina pectoris: Secondary | ICD-10-CM

## 2022-01-08 DIAGNOSIS — E785 Hyperlipidemia, unspecified: Secondary | ICD-10-CM

## 2022-01-08 DIAGNOSIS — F320 Major depressive disorder, single episode, mild: Secondary | ICD-10-CM

## 2022-01-08 DIAGNOSIS — M545 Low back pain, unspecified: Secondary | ICD-10-CM

## 2022-01-08 DIAGNOSIS — I10 Essential (primary) hypertension: Secondary | ICD-10-CM

## 2022-01-08 DIAGNOSIS — G4709 Other insomnia: Secondary | ICD-10-CM

## 2022-01-08 NOTE — Telephone Encounter (Signed)
Medication Refill - Medication: ALPRAZolam (XANAX) 0.5 MG tablet [[id number]]  Pt is out of this medication  buPROPion (WELLBUTRIN XL) 300 MG 24 hr tablet [[id number]]   cyclobenzaprine (FLEXERIL) 5 MG tablet [[id number]]   lisinopril (ZESTRIL) 20 MG tablet [[id number]]   rosuvastatin (CRESTOR) 20 MG tablet [[id number]]   temazepam (RESTORIL) 15 MG capsule [[id number]]  Pt is out of this medication  Meds were sent to the wrong pharmacy. Can these be sent to the pharmacy below?  Has the patient contacted their pharmacy? Yes.   (Agent: If no, request that the patient contact the pharmacy for the refill. If patient does not wish to contact the pharmacy document the reason why and proceed with request.) (Agent: If yes, when and what did the pharmacy advise?)  Preferred Pharmacy (with phone number or street name):  [facility] [place], [place] [facility] [address] [place] [address]  Phone: [phone] Fax: [phone]  Hours: Not open 24 hours     Has the patient been seen for an appointment in the last year OR does the patient have an upcoming appointment? Yes.    Agent: Please be advised that RX refills may take up to 3 business days. We ask that you follow-up with your pharmacy.

## 2022-01-08 NOTE — Telephone Encounter (Signed)
Medication Refill - Medication: ezetimibe (ZETIA) 10 MG tablet   Patient wants to transfer rx to  [facility], [place] Phone: [phone]  Fax: [phone]

## 2022-01-09 ENCOUNTER — Other Ambulatory Visit: Payer: Self-pay

## 2022-01-09 MED ORDER — EZETIMIBE 10 MG PO TABS: 10.0000 mg | ORAL_TABLET | Freq: Every day | ORAL | 1 refills | Status: DC

## 2022-01-09 NOTE — Telephone Encounter (Signed)
Pt requested this rx be sent to a different pharmacy.     Requested Prescriptions  Pending Prescriptions Disp Refills   ezetimibe (ZETIA) 10 MG tablet 90 tablet 1    Sig: Take 1 tablet (10 mg total) by mouth daily.     Cardiovascular:  Antilipid - Sterol Transport Inhibitors Failed - 01/08/2022 11:33 AM      Failed - Lipid Panel in normal range within the last 12 months    Cholesterol, Total  Date Value Ref Range Status  08/26/2014 170 100 - 199 mg/dL Final   Cholesterol  Date Value Ref Range Status  11/20/2021 161 <200 mg/dL Final   LDL Cholesterol (Calc)  Date Value Ref Range Status  11/20/2021 71 mg/dL (calc) Final    Comment:    Reference range: <100 . Desirable range <100 mg/dL for primary prevention;   <70 mg/dL for patients with CHD or diabetic patients  with > or = 2 CHD risk factors. Marland Kitchen LDL-C is now calculated using the Martin-Hopkins  calculation, which is a validated novel method providing  better accuracy than the Friedewald equation in the  estimation of LDL-C.  Cresenciano Genre et al. Annamaria Helling. 8592;924(46): 2061-2068  (http://education.[employer].com/faq/FAQ164)    HDL  Date Value Ref Range Status  11/20/2021 65 > OR = 50 mg/dL Final  08/26/2014 66 >39 mg/dL Final    Comment:    According to ATP-III Guidelines, HDL-C >59 mg/dL is considered a negative risk factor for CHD.    Triglycerides  Date Value Ref Range Status  11/20/2021 170 (H) <150 mg/dL Final         Passed - AST in normal range and within 360 days    AST  Date Value Ref Range Status  11/20/2021 35 10 - 35 U/L Final         Passed - ALT in normal range and within 360 days    ALT  Date Value Ref Range Status  11/20/2021 25 6 - 29 U/L Final         Passed - Patient is not pregnant      Passed - Valid encounter within last 12 months    Recent Outpatient Visits           1 month ago Senile purpura [facility])   [facility] Steele Sizer, MD   1 month ago Well woman  exam   [facility] Steele Sizer, MD   9 months ago Angina pectoris syndrome [facility])   [facility] Steele Sizer, MD   1 year ago Benign essential HTN   [facility] Steele Sizer, MD   1 year ago Well adult exam   [facility] Steele Sizer, MD       Future Appointments             In 48 months Steele Sizer, MD [facility], [facility]

## 2022-01-09 NOTE — Telephone Encounter (Signed)
Requested medication (s) are due for refill today: routing for review  Requested medication (s) are on the active medication list: yes  Last refill:  11/29/21  Future visit scheduled: yes  Notes to clinic:  Unable to refill per protocol, cannot delegate. Patient request medication be [facility]. Last refill 11/29/21 for 90 and 1 was sent to a different pharmacy. Routing for review.      Requested Prescriptions  Pending Prescriptions Disp Refills   ALPRAZolam (XANAX) 0.5 MG tablet 30 tablet 0    Sig: Take 1 tablet (0.5 mg total) by mouth daily as needed. for anxiety     Not Delegated - Psychiatry: Anxiolytics/Hypnotics 2 Failed - 01/08/2022 11:25 AM      Failed - This refill cannot be delegated      Failed - Urine Drug Screen completed in last 360 days      Passed - Patient is not pregnant      Passed - Valid encounter within last 6 months    Recent Outpatient Visits           1 month ago Senile purpura [facility])   [facility] Steele Sizer, MD   1 month ago Well woman exam   [facility] Sizer, MD   9 months ago Angina pectoris syndrome [facility])   [facility] Sizer, MD   1 year ago Benign essential HTN   [facility] Sizer, MD   1 year ago Well adult exam   [facility] Sizer, MD       Future Appointments             In 10 months Steele Sizer, MD [facility]             buPROPion (WELLBUTRIN XL) 300 MG 24 hr tablet 90 tablet 1    Sig: Take 1 tablet (300 mg total) by mouth daily.     Psychiatry: Antidepressants - bupropion Passed - 01/08/2022 11:25 AM      Passed - Cr in normal range and within 360 days    Creat  Date Value Ref Range Status  11/20/2021 0.85 0.60 - 1.00 mg/dL Final         Passed - AST in normal range and within 360 days    AST  Date Value Ref Range Status   11/20/2021 35 10 - 35 U/L Final         Passed - ALT in normal range and within 360 days    ALT  Date Value Ref Range Status  11/20/2021 25 6 - 29 U/L Final         Passed - Completed PHQ-2 or PHQ-9 in the last 360 days      Passed - Last BP in normal range    BP Readings from Last 1 Encounters:  11/20/21 122/82         Passed - Valid encounter within last 6 months    Recent Outpatient Visits           1 month ago Senile purpura [facility])   [facility]izer, MD   1 month ago Well woman exam   [facility]   9 months ago Angina pectoris syndrome [facility])   [facility], MD   1 year ago Benign essential HTN   [facility]  Steele [facility], MD   1 year ago Well adult exam   [facility] Steele Sizer, MD       Future Appointments             In 10 months Steele Sizer, MD [facility]             cyclobenzaprine (FLEXERIL) 5 MG tablet 90 tablet 0    Sig: Take 1 tablet (5 mg total) by mouth at bedtime.     Not Delegated - Analgesics:  Muscle Relaxants Failed - 01/08/2022 11:25 AM      Failed - This refill cannot be delegated      Passed - Valid encounter within last 6 months    Recent Outpatient Visits           1 month ago Senile purpura [facility])   [facility], MD   1 month ago Well woman exam   [facility]izer, MD   9 months ago Angina pectoris syndrome [facility])   [facility], [facility]   1 year ago Benign essential HTN   [facility]   1 year ago Well adult exam   [facility]       Future Appointments             In 10 months Steele Sizer, MD [facility]             lisinopril  (ZESTRIL) 20 MG tablet 90 tablet 1    Sig: Take 1 tablet (20 mg total) by mouth daily.     Cardiovascular:  ACE Inhibitors Passed - 01/08/2022 11:25 AM      Passed - Cr in normal range and within 180 days    Creat  Date Value Ref Range Status  11/20/2021 0.85 0.60 - 1.00 mg/dL Final         Passed - K in normal range and within 180 days    Potassium  Date Value Ref Range Status  11/20/2021 4.4 3.5 - 5.3 mmol/L Final         Passed - Patient is not pregnant      Passed - Last BP in normal range    BP Readings from Last 1 Encounters:  11/20/21 122/82         Passed - Valid encounter within last 6 months    Recent Outpatient Visits           1 month ago Senile purpura [facility])   [facility] Sizer, MD   1 month ago Well woman exam   [facility]izer, MD   9 months ago Angina pectoris syndrome [facility])   [facility], MD   1 year ago Benign essential HTN   [facility], [facility]   1 year ago Well adult exam   [facility], MD       Future Appointments             In 10 months Steele Sizer, MD [facility]             rosuvastatin (CRESTOR) 20 MG tablet 90 tablet 1    Sig: Take 1 tablet (20 mg total) by mouth daily.     Cardiovascular:  Antilipid - Statins  2 Failed - 01/08/2022 11:25 AM      Failed - Lipid Panel in normal range within the last 12 months    Cholesterol, Total  Date Value Ref Range Status  08/26/2014 170 100 - 199 mg/dL Final   Cholesterol  Date Value Ref Range Status  11/20/2021 161 <200 mg/dL Final   LDL Cholesterol (Calc)  Date Value Ref Range Status  11/20/2021 71 mg/dL (calc) Final    Comment:    Reference range: <100 . Desirable range <100 mg/dL for primary prevention;   <70 mg/dL for patients with CHD or diabetic patients  with > or = 2 CHD risk  factors. Marland Kitchen LDL-C is now calculated using the Martin-Hopkins  calculation, which is a validated novel method providing  better accuracy than the Friedewald equation in the  estimation of LDL-C.  Cresenciano Genre et al. Annamaria Helling. 2505;397(67): 2061-2068  (http://education.[facility].com/faq/FAQ164)    HDL  Date Value Ref Range Status  11/20/2021 65 > OR = 50 mg/dL Final  08/26/2014 66 >39 mg/dL Final    Comment:    According to ATP-III Guidelines, HDL-C >59 mg/dL is considered a negative risk factor for CHD.    Triglycerides  Date Value Ref Range Status  11/20/2021 170 (H) <150 mg/dL Final         Passed - Cr in normal range and within 360 days    Creat  Date Value Ref Range Status  11/20/2021 0.85 0.60 - 1.00 mg/dL Final         Passed - Patient is not pregnant      Passed - Valid encounter within last 12 months    Recent Outpatient Visits           1 month ago Senile purpura [facility])   [facility]izer, MD   1 month ago Well woman exam   [facility], [facility]   9 months ago [facility]ina pectoris syndrome [facility])   [facility]   1 year ago Benign essential HTN   [facility], [facility]   1 year ago Well adult exam   [facility]izer, MD       Future Appointments             In 10 months Steele Sizer, MD [facility]             temazepam (RESTORIL) 15 MG capsule 90 capsule 0    Sig: Take 1 capsule (15 mg total) by mouth at bedtime as needed for sleep.     Not Delegated - Psychiatry: Anxiolytics/Hypnotics 2 Failed - 01/08/2022 11:25 AM      Failed - This refill cannot be delegated      Failed - Urine Drug Screen completed in last 360 days      Passed - Patient is not pregnant      Passed - Valid encounter within last 6 months    Recent Outpatient Visits           1 month ago  Senile purpura [facility])   [facility]   1 month ago Well woman exam   [facility]   9 months ago Angina pectoris syndrome [facility])   [facility], MD   1 year ago Benign essential HTN   [facility], MD  1 year ago Well adult exam   [facility] Steele Sizer, MD       Future Appointments             In 10 months Steele Sizer, MD [facility], [facility]

## 2022-01-10 MED ORDER — LISINOPRIL 20 MG PO TABS: 20.0000 mg | ORAL_TABLET | Freq: Every day | ORAL | 1 refills | Status: DC

## 2022-01-10 MED ORDER — ROSUVASTATIN CALCIUM 20 MG PO TABS: 20.0000 mg | ORAL_TABLET | Freq: Every day | ORAL | 1 refills | Status: DC

## 2022-01-10 MED ORDER — TEMAZEPAM 15 MG PO CAPS: 15.0000 mg | ORAL_CAPSULE | Freq: Every evening | ORAL | 0 refills | Status: DC | PRN

## 2022-01-10 MED ORDER — BUPROPION HCL ER (XL) 300 MG PO TB24: 300.0000 mg | ORAL_TABLET | Freq: Every day | ORAL | 1 refills | Status: DC

## 2022-01-10 MED ORDER — CYCLOBENZAPRINE HCL 5 MG PO TABS: 5.0000 mg | ORAL_TABLET | Freq: Every day | ORAL | 1 refills | Status: DC

## 2022-02-07 ENCOUNTER — Other Ambulatory Visit: Payer: No Typology Code available for payment source

## 2022-03-22 ENCOUNTER — Ambulatory Visit
Admission: RE | Admit: 2022-03-22 | Discharge: 2022-03-22 | Disposition: A | Discharge status: Home / Self Care | Payer: No Typology Code available for payment source | Source: Ambulatory Visit | Attending: Family Medicine | Admitting: Family Medicine

## 2022-03-22 DIAGNOSIS — M8588 Other specified disorders of bone density and structure, other site: Secondary | ICD-10-CM | POA: Diagnosis not present

## 2022-03-22 DIAGNOSIS — Z13820 Encounter for screening for osteoporosis: Secondary | ICD-10-CM | POA: Insufficient documentation

## 2022-03-22 DIAGNOSIS — Z780 Asymptomatic menopausal state: Secondary | ICD-10-CM | POA: Diagnosis not present

## 2022-03-22 DIAGNOSIS — Z1231 Encounter for screening mammogram for malignant neoplasm of breast: Secondary | ICD-10-CM | POA: Diagnosis not present

## 2022-03-22 DIAGNOSIS — M8589 Other specified disorders of bone density and structure, multiple sites: Secondary | ICD-10-CM | POA: Diagnosis not present

## 2022-04-24 ENCOUNTER — Other Ambulatory Visit: Payer: Self-pay | Admitting: Internal Medicine

## 2022-04-24 DIAGNOSIS — I10 Essential (primary) hypertension: Secondary | ICD-10-CM

## 2022-04-24 DIAGNOSIS — I2510 Atherosclerotic heart disease of native coronary artery without angina pectoris: Secondary | ICD-10-CM

## 2022-04-24 NOTE — Telephone Encounter (Signed)
Rx  11/29/21 #180 1RF-too soon Requested Prescriptions  Pending Prescriptions Disp Refills   carvedilol (COREG) 3.125 MG tablet [Pharmacy Med Name: CARVEDILOL TAB 3.125MG ] 180 tablet 3    Sig: TAKE 1 TABLET TWICE A DAY  WITH MEALS     Cardiovascular: Beta Blockers 3 Passed - 04/24/2022 11:48 AM      Passed - Cr in normal range and within 360 days    Creat  Date Value Ref Range Status  11/20/2021 0.85 0.60 - 1.00 mg/dL Final         Passed - AST in normal range and within 360 days    AST  Date Value Ref Range Status  11/20/2021 35 10 - 35 U/L Final         Passed - ALT in normal range and within 360 days    ALT  Date Value Ref Range Status  11/20/2021 25 6 - 29 U/L Final         Passed - Last BP in normal range    BP Readings from Last 1 Encounters:  11/20/21 122/82         Passed - Last Heart Rate in normal range    Pulse Readings from Last 1 Encounters:  11/20/21 96         Passed - Valid encounter within last 6 months    Recent Outpatient Visits           4 months ago Senile purpura [facility])   [facility]   5 months ago Well woman exam   [facility], MD   1 year ago Angina pectoris syndrome [facility])   [facility], [facility]   1 year ago Benign essential HTN   [facility], [facility]   1 year ago Well adult exam   [facility], MD       Future Appointments             In 7 months Ancil Boozer, Drue Stager, MD [facility], [facility]

## 2022-05-07 ENCOUNTER — Other Ambulatory Visit: Payer: Self-pay | Admitting: Family Medicine

## 2022-05-07 DIAGNOSIS — I2510 Atherosclerotic heart disease of native coronary artery without angina pectoris: Secondary | ICD-10-CM

## 2022-05-07 DIAGNOSIS — E785 Hyperlipidemia, unspecified: Secondary | ICD-10-CM

## 2022-05-07 NOTE — Telephone Encounter (Signed)
Medication Refill - Medication: ezetimibe (ZETIA) 10 MG tablet Pt states that she has 6 days left.  Has the patient contacted their pharmacy? Yes.   Pharmacy states that sent over a refill request to PCP and have not gotten a response.  Order number: [id number]  Preferred Pharmacy (with phone number or street name): [facility] - [place], [place] - [address]  Phone: [phone] Fax: [phone]  Has the patient been seen for an appointment in the last year OR does the patient have an upcoming appointment? Yes.    Agent: Please be advised that RX refills may take up to 3 business days. We ask that you follow-up with your pharmacy.

## 2022-05-08 MED ORDER — EZETIMIBE 10 MG PO TABS: 10.0000 mg | ORAL_TABLET | Freq: Every day | ORAL | 1 refills | Status: DC

## 2022-05-08 NOTE — Telephone Encounter (Signed)
Requested Prescriptions  Pending Prescriptions Disp Refills   ezetimibe (ZETIA) 10 MG tablet 90 tablet 1    Sig: Take 1 tablet (10 mg total) by mouth daily.     Cardiovascular:  Antilipid - Sterol Transport Inhibitors Failed - 05/07/2022  2:50 PM      Failed - Lipid Panel in normal range within the last 12 months    Cholesterol, Total  Date Value Ref Range Status  08/26/2014 170 100 - 199 mg/dL Final   Cholesterol  Date Value Ref Range Status  11/20/2021 161 <200 mg/dL Final   LDL Cholesterol (Calc)  Date Value Ref Range Status  11/20/2021 71 mg/dL (calc) Final    Comment:    Reference range: <100 . Desirable range <100 mg/dL for primary prevention;   <70 mg/dL for patients with CHD or diabetic patients  with > or = 2 CHD risk factors. Marland Kitchen LDL-C is now calculated using the Martin-Hopkins  calculation, which is a validated novel method providing  better accuracy than the Friedewald equation in the  estimation of LDL-C.  Horald Pollen et al. Lenox Ahr. 6962;952(84): 2061-2068  (http://education.[facility].com/faq/FAQ164)    HDL  Date Value Ref Range Status  11/20/2021 65 > OR = 50 mg/dL Final  13/24/4010 66 >27 mg/dL Final    Comment:    According to ATP-III Guidelines, HDL-C >59 mg/dL is considered a negative risk factor for CHD.    Triglycerides  Date Value Ref Range Status  11/20/2021 170 (H) <150 mg/dL Final         Passed - AST in normal range and within 360 days    AST  Date Value Ref Range Status  11/20/2021 35 10 - 35 U/L Final         Passed - ALT in normal range and within 360 days    ALT  Date Value Ref Range Status  11/20/2021 25 6 - 29 U/L Final         Passed - Patient is not pregnant      Passed - Valid encounter within last 12 months    Recent Outpatient Visits           5 months ago Senile purpura [facility])   [facility] Cory, MD   5 months ago Well woman exam   [facility]  Alba [facility], MD   1 year ago Angina pectoris syndrome [facility])   [facility] Alba Cory, MD   1 year ago Benign essential HTN   [facility] Al[facility], MD   1 year ago Well adult exam   [facility] Alba Cory, MD       Future Appointments             In 6 months Carlynn Purl, Danna Hefty, MD [facility], [facility]

## 2022-06-13 ENCOUNTER — Other Ambulatory Visit: Payer: Self-pay | Admitting: Family Medicine

## 2022-06-13 DIAGNOSIS — M545 Low back pain, unspecified: Secondary | ICD-10-CM

## 2022-06-13 MED ORDER — CYCLOBENZAPRINE HCL 5 MG PO TABS: 5.0000 mg | ORAL_TABLET | Freq: Every day | ORAL | 1 refills | Status: DC

## 2022-06-13 NOTE — Telephone Encounter (Signed)
Medication Refill - Medication:  cyclobenzaprine (FLEXERIL) 5 MG tablet  Pt is out of medication.    Has the patient contacted their pharmacy? Yes.   Per pharmacy no refills.   Preferred Pharmacy (with phone number or street name): [facility] - [place], [place] - [address] [facility]  Phone: [phone] Fax: [phone]  Has the patient been seen for an appointment in the last year OR does the patient have an upcoming appointment? Yes.    Agent: Please be advised that RX refills may take up to 3 business days. We ask that you follow-up with your pharmacy.

## 2022-06-13 NOTE — Telephone Encounter (Signed)
Requested medication (s) are due for refill today:   Provider to review  Requested medication (s) are on the active medication list:   Yes  Future visit scheduled:   Yes   Last ordered: 01/10/2022 #90, 1 refill  Non delegated refill    Requested Prescriptions  Pending Prescriptions Disp Refills   cyclobenzaprine (FLEXERIL) 5 MG tablet 90 tablet 1    Sig: Take 1 tablet (5 mg total) by mouth at bedtime.     Not Delegated - Analgesics:  Muscle Relaxants Failed - 06/13/2022 11:52 AM      Failed - This refill cannot be delegated      Failed - Valid encounter within last 6 months    Recent Outpatient Visits           6 months ago Senile purpura [facility])   [facility] Al[facility] Cory, MD   6 months ago Well woman exam   [facility], MD   1 year ago Angina pectoris syndrome [facility])   [facility], MD   1 year ago Benign essential HTN   [facility] Al[facility] Cory, MD   1 year ago Well adult exam   [facility], MD       Future Appointments             In 5 months Carlynn Purl, Danna Hefty, MD [facility], [facility]

## 2022-10-24 DIAGNOSIS — E1169 Type 2 diabetes mellitus with other specified complication: Secondary | ICD-10-CM | POA: Diagnosis not present

## 2022-10-24 DIAGNOSIS — Z6826 Body mass index (BMI) 26.0-26.9, adult: Secondary | ICD-10-CM | POA: Diagnosis not present

## 2022-10-24 DIAGNOSIS — Z008 Encounter for other general examination: Secondary | ICD-10-CM | POA: Diagnosis not present

## 2022-10-24 DIAGNOSIS — I25119 Atherosclerotic heart disease of native coronary artery with unspecified angina pectoris: Secondary | ICD-10-CM | POA: Diagnosis not present

## 2022-10-24 DIAGNOSIS — E1159 Type 2 diabetes mellitus with other circulatory complications: Secondary | ICD-10-CM | POA: Diagnosis not present

## 2022-10-24 DIAGNOSIS — I129 Hypertensive chronic kidney disease with stage 1 through stage 4 chronic kidney disease, or unspecified chronic kidney disease: Secondary | ICD-10-CM | POA: Diagnosis not present

## 2022-10-24 DIAGNOSIS — E663 Overweight: Secondary | ICD-10-CM | POA: Diagnosis not present

## 2022-10-24 DIAGNOSIS — E785 Hyperlipidemia, unspecified: Secondary | ICD-10-CM | POA: Diagnosis not present

## 2022-10-24 DIAGNOSIS — N182 Chronic kidney disease, stage 2 (mild): Secondary | ICD-10-CM | POA: Diagnosis not present

## 2022-10-24 DIAGNOSIS — F324 Major depressive disorder, single episode, in partial remission: Secondary | ICD-10-CM | POA: Diagnosis not present

## 2022-11-23 NOTE — Progress Notes (Unsigned)
Name: Elizabeth Johnson   MRN: [id number]    DOB: [birth year]   Date:11/26/2022       Progress Note  Subjective  Chief Complaint  Annual Exam  HPI  Patient presents for annual CPE.  Diet: balanced diet  Exercise: she needs to increase physical activity  Last Eye Exam: scheduled for this month Last Dental Exam: up to date   [employer] Visit from 11/26/2022 in [facility]  AUDIT-C Score 3      Drinks one glass of wine to relax and sleep, discussed the risk of addiction and also disruptive sleep cycle, she will cut down on intake  Depression: Phq 9 is  negative    11/26/2022   10:44 AM 11/29/2021   10:42 AM 11/20/2021    3:08 PM 04/11/2021   11:18 AM 09/08/2020    9:57 AM  Depression screen PHQ 2/9  Decreased Interest 0 1 2 0 0  Down, Depressed, Hopeless 0 2 2 3  0  PHQ - 2 Score 0 3 4 3  0  Altered sleeping 0 0 2 0 0  Tired, decreased energy 0 0 2 0 0  Change in appetite 0 0 0 0 0  Feeling bad or failure about yourself  0 0 0 0 0  Trouble concentrating 0 0 0 0 0  Moving slowly or fidgety/restless 0 0 2 0 0  Suicidal thoughts 0 0 0 0 0  PHQ-9 Score 0 3 10 3  0  Difficult doing work/chores   Somewhat difficult     Hypertension: BP Readings from Last 3 Encounters:  11/26/22 118/68  11/20/21 122/82  11/03/21 109/76   Obesity: Wt Readings from Last 3 Encounters:  11/26/22 148 lb (67.1 kg)  11/20/21 147 lb 4.8 oz (66.8 kg)  11/03/21 142 lb (64.4 kg)   BMI Readings from Last 3 Encounters:  11/26/22 27.96 kg/m  11/20/21 26.51 kg/m  11/03/21 23.63 kg/m     Vaccines:   RSV: discussed with patient  Tdap: 2009, due and she will go to local pharmacy  Shingrix: she will go back for second dose  Pneumonia: up to date Flu: today  COVID-19: up to date   Hep C Screening: 09/10/13 STD testing and prevention (HIV/chl/gon/syphilis): N/A Intimate partner violence: negative screen  Sexual History : one partner , no pain Menstrual  History/LMP/Abnormal Bleeding: s/p hysterectomy  Discussed importance of follow up if any post-menopausal bleeding: N/A  Incontinence Symptoms: negative for symptoms   Breast cancer:  - Last Mammogram: 03/22/22 - BRCA gene screening: N/A  Osteoporosis Prevention : Discussed high calcium and vitamin D supplementation, weight bearing exercises Bone density: 03/22/22   Cervical cancer screening: N/A  Skin cancer: Discussed monitoring for atypical lesions  Colorectal cancer: 11/03/21   Lung cancer:  Low Dose CT Chest recommended if Age 42-80 years, 20 pack-year currently smoking OR have quit w/in 15years. Patient does not qualify for screen  - quit in 2009  ECG: 10/16/16  Advanced Care Planning: A voluntary discussion about advance care planning including the explanation and discussion of advance directives.  Discussed health care proxy and Living will, and the patient was able to identify a health care proxy as husband .  Patient does not have a living will and power of attorney of health care   Lipids: Lab Results  Component Value Date   CHOL 161 11/20/2021   CHOL 186 08/03/2020   CHOL 188 05/05/2019   Lab Results  Component Value Date  HDL 65 11/20/2021   HDL 68 08/03/2020   HDL 66 05/05/2019   Lab Results  Component Value Date   LDLCALC 71 11/20/2021   LDLCALC 90 08/03/2020   LDLCALC 95 05/05/2019   Lab Results  Component Value Date   TRIG 170 (H) 11/20/2021   TRIG 188 (H) 08/03/2020   TRIG 173 (H) 05/05/2019   Lab Results  Component Value Date   CHOLHDL 2.5 11/20/2021   CHOLHDL 2.7 08/03/2020   CHOLHDL 2.8 05/05/2019   No results found for: "LDLDIRECT"  Glucose: Glucose, Bld  Date Value Ref Range Status  11/20/2021 113 (H) 65 - 99 mg/dL Final    Comment:    .            Fasting reference interval . For someone without known diabetes, a glucose value between 100 and 125 mg/dL is consistent with prediabetes and should be confirmed with a follow-up  test. .   08/03/2020 119 (H) 65 - 99 mg/dL Final    Comment:    .            Fasting reference interval . For someone without known diabetes, a glucose value between 100 and 125 mg/dL is consistent with prediabetes and should be confirmed with a follow-up test. .   05/05/2019 130 (H) 65 - 99 mg/dL Final    Comment:    .            Fasting reference interval . For someone without known diabetes, a glucose value >125 mg/dL indicates that they may have diabetes and this should be confirmed with a follow-up test. .    Glucose-Capillary  Date Value Ref Range Status  04/15/2017 104 (H) 65 - 99 mg/dL Final    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   Biceps tendon rupture 09/21/2015   H/O hysterectomy for benign disease 09/02/2014   Acquired bilateral hammer toes 09/02/2014   External hemorrhoid 09/02/2014   Allergic rhinitis 07/19/2014   Carpal tunnel syndrome on right 07/19/2014   Other insomnia 07/19/2014   Intermittent low back pain 07/19/2014   Mild major depression (HCC) 07/19/2014   Dyslipidemia 07/19/2014   Hemorrhoid 07/19/2014   Dysmetabolic syndrome 07/19/2014   Coronary artery disease involving left main coronary artery 07/19/2014   Osteopenia 07/19/2014   Benign essential HTN 02/04/2009   H/O acute myocardial infarction of lateral wall 07/18/2004    Past Surgical History:  Procedure Laterality Date   ABDOMINAL HYSTERECTOMY     BUNIONECTOMY Bilateral 1973   CARPAL TUNNEL RELEASE Right 04/15/2017   Procedure: CARPAL TUNNEL RELEASE;  Surgeon: Deeann Saint, MD;  Location: [facility] ORS;  Service: Orthopedics;  Laterality: Right;   CATARACT EXTRACTION, BILATERAL  05/21 & 06/21    COLONOSCOPY WITH PROPOFOL N/A 11/23/2016   Procedure: COLONOSCOPY WITH PROPOFOL;  Surgeon: Wyline Mood, MD;  Location: [facility] ENDOSCOPY;  Service: Gastroenterology;  Laterality: N/A;   COLONOSCOPY WITH PROPOFOL N/A 11/03/2021   Procedure: COLONOSCOPY WITH PROPOFOL;  Surgeon: Wyline Mood, MD;  Location: [facility] ENDOSCOPY;  Service: Gastroenterology;  Laterality: N/A;   CORONARY STENT PLACEMENT  2009   ELBOW SURGERY Left 1977   HAMMER TOE SURGERY Right 1992   RIB FRACTURE SURGERY  WRIST FRACTURE SURGERY Right 1977    Family History  Problem Relation Age of Onset   Alzheimer's disease Mother    Breast cancer Mother 71   CAD Father    Heart attack Father    Heart failure Father    COPD Father    Drug abuse Sister    Suicidality Sister    Heart attack Brother     Social History   Socioeconomic History   Marital status: Significant Other    Spouse name: Not on file   Number of children: 1   Years of education: Not on file   Highest education level: Not on file  Occupational History   Occupation: retired   Tobacco Use   Smoking status: Former    Current packs/day: 0.00    Average packs/day: 0.3 packs/day for 8.3 years (2.1 ttl pk-yrs)    Types: Cigarettes    Start date: 01/23/1999    Quit date: 05/23/2007    Years since quitting: 15.5   Smokeless tobacco: Never  Vaping Use   Vaping status: Never Used  Substance and Sexual Activity   Alcohol use: Yes    Alcohol/week: 0.0 standard drinks of alcohol    Comment: occasional   Drug use: No   Sexual activity: Yes    Partners: Male  Other Topics Concern   Not on file  Social History Narrative   She worked for the same company for 20 years and they closed [place] branch and she was forced to retire Fall 2017. When her position was moved to [facility] with fiance for almost 30 years    She lost two of her dogs within 8 months, last dog died 06/10/23and she is still grieving.    Social Determinants of Health   Financial Resource  Strain: Low Risk  (11/26/2022)   Overall Financial Resource Strain (CARDIA)    Difficulty of Paying Living Expenses: Not hard at all  Food Insecurity: No Food Insecurity (11/26/2022)   Hunger Vital Sign    Worried About Running Out of Food in the Last Year: Never true    Ran Out of Food in the Last Year: Never true  Transportation Needs: No Transportation Needs (11/26/2022)   PRAPARE - Administrator, Civil Service (Medical): No    Lack of Transportation (Non-Medical): No  Physical Activity: Insufficiently Active (11/26/2022)   Exercise Vital Sign    Days of Exercise per Week: 2 days    Minutes of Exercise per Session: 20 min  Stress: No Stress Concern Present (11/26/2022)   [employer] of Occupational Health - Occupational Stress Questionnaire    Feeling of Stress : Only a little  Social Connections: Moderately Isolated (11/26/2022)   Social Connection and Isolation Panel [NHANES]    Frequency of Communication with Friends and Family: Twice a week    Frequency of Social Gatherings with Friends and Family: Once a week    Attends Religious Services: Never    Database administrator or Organizations: No    Attends Banker Meetings: Never    Marital Status: Living with partner  Intimate Partner Violence: Not At Risk (11/26/2022)   Humiliation, Afraid, Rape, and Kick questionnaire    Fear of Current or Ex-Partner: No    Emotionally Abused: No    Physically Abused: No    Sexually Abused: No     Current Outpatient Medications:    ALPRAZolam (XANAX) 0.5 MG tablet, Take 1 tablet (0.5  mg total) by mouth daily as needed. for anxiety, Disp: 30 tablet, Rfl: 0   aspirin 81 MG chewable tablet, Chew 81 mg by mouth daily. , Disp: , Rfl:    buPROPion (WELLBUTRIN XL) 300 MG 24 hr tablet, Take 1 tablet (300 mg total) by mouth daily., Disp: 90 tablet, Rfl: 1   carvedilol (COREG) 3.125 MG tablet, Take 1 tablet (3.125 mg total) by mouth 2 (two) times daily with a meal., Disp:  180 tablet, Rfl: 1   cholecalciferol (VITAMIN D3) 25 MCG (1000 UNIT) tablet, Take 1,000 Units by mouth daily., Disp: , Rfl:    cyclobenzaprine (FLEXERIL) 5 MG tablet, Take 1 tablet (5 mg total) by mouth at bedtime., Disp: 90 tablet, Rfl: 1   ezetimibe (ZETIA) 10 MG tablet, Take 1 tablet (10 mg total) by mouth daily., Disp: 90 tablet, Rfl: 1   lisinopril (ZESTRIL) 20 MG tablet, Take 1 tablet (20 mg total) by mouth daily., Disp: 90 tablet, Rfl: 1   nitroGLYCERIN (NITROSTAT) 0.4 MG SL tablet, DISSOLVE 1 TABLET UNDER THE TONGUE EVERY 5 MINUTES AS  NEEDED FOR CHEST PAIN. MAX  OF 3 TABLETS IN 15 MINUTES. CALL 911 IF PAIN PERSISTS., Disp: 100 tablet, Rfl: 3   rosuvastatin (CRESTOR) 20 MG tablet, Take 1 tablet (20 mg total) by mouth daily., Disp: 90 tablet, Rfl: 1   temazepam (RESTORIL) 15 MG capsule, Take 1 capsule (15 mg total) by mouth at bedtime as needed for sleep., Disp: 90 capsule, Rfl: 0  Allergies  Allergen Reactions   Codeine Other (See Comments)    Sensitivity. Nauseated      ROS  Constitutional: Negative for fever or weight change.  Respiratory: Negative for cough and shortness of breath.   Cardiovascular: Negative for chest pain or palpitations.  Gastrointestinal: Negative for abdominal pain, no bowel changes.  Musculoskeletal: Negative for gait problem or joint swelling.  Skin: Negative for rash.  Neurological: Negative for dizziness or headache.  No other specific complaints in a complete review of systems (except as listed in HPI above).   Objective  Vitals:   11/26/22 1044  BP: 118/68  Pulse: 91  Resp: 16  SpO2: 96%  Weight: 148 lb (67.1 kg)  Height: 5\' 1"  (1.549 m)    Body mass index is 27.96 kg/m.  Physical Exam  Constitutional: Patient appears well-developed and well-nourished. No distress.  HENT: Head: Normocephalic and atraumatic. Ears: B TMs ok, no erythema or effusion; Nose: Nose normal. Mouth/Throat: Oropharynx is clear and moist. No oropharyngeal  exudate.  Eyes: Conjunctivae and EOM are normal. Pupils are equal, round, and reactive to light. No scleral icterus.  Neck: Normal range of motion. Neck supple. No JVD present. No thyromegaly present. She has fullness on left side of neck, [place] in the past negative, she may need a vascular US or CT - we will discuss it further on her follow up Cardiovascular: Normal rate, regular rhythm and normal heart sounds.  No murmur heard. No BLE edema. Pulmonary/Chest: Effort normal and breath sounds normal. No respiratory distress. Abdominal: Soft. Bowel sounds are normal, no distension. There is no tenderness. no masses Breast: no lumps or masses, no nipple discharge or rashes FEMALE GENITALIA:  Not done  RECTAL: not done Musculoskeletal: Normal range of motion, no joint effusions. No gross deformities Neurological: he is alert and oriented to person, place, and time. No cranial nerve deficit. Coordination, balance, strength, speech and gait are normal.  Skin: Skin is warm and dry. No rash noted. No erythema.  Psychiatric: Patient has a normal mood and affect. behavior is normal. Judgment and thought content normal.    Fall Risk:    11/26/2022   10:44 AM 11/29/2021   10:41 AM 11/20/2021    3:09 PM 04/11/2021   11:18 AM 09/08/2020    9:49 AM  Fall Risk   Falls in the past year? 0 0 0 0 0  Number falls in past yr: 0 0  0 0  Injury with Fall? 0 0  0 0  Risk for fall due to : No Fall Risks No Fall Risks No Fall Risks No Fall Risks No Fall Risks  Follow up Falls prevention discussed Falls prevention discussed Falls prevention discussed;Education provided;Falls evaluation completed Falls prevention discussed Falls prevention discussed     Functional Status Survey: Is the patient deaf or have difficulty hearing?: No Does the patient have difficulty seeing, even when wearing glasses/contacts?: No Does the patient have difficulty concentrating, remembering, or making decisions?: No Does the patient  have difficulty walking or climbing stairs?: No Does the patient have difficulty dressing or bathing?: No Does the patient have difficulty doing errands alone such as visiting a doctor's office or shopping?: No   Assessment & Plan  1. Well adult exam  - CBC with Differential/Platelet - COMPLETE METABOLIC PANEL WITH GFR - Lipid panel - B12 and Folate Panel - VITAMIN D 25 Hydroxy (Vit-D Deficiency, Fractures) - Hemoglobin A1c - Parathyroid hormone, intact (no Ca)  2. Dyslipidemia  - Lipid panel  3. Elevated parathyroid hormone  - VITAMIN D 25 Hydroxy (Vit-D Deficiency, Fractures) - Parathyroid hormone, intact (no Ca)  4. B12 deficiency  - CBC with Differential/Platelet - B12 and Folate Panel  5. Hyperglycemia  - Hemoglobin A1c  6. Breast cancer screening by mammogram  - MM 3D SCREENING MAMMOGRAM BILATERAL BREAST; Future  7. Long-term use of high-risk medication  - COMPLETE METABOLIC PANEL WITH GFR  8. Needs flu shot  - Flu Vaccine Trivalent High Dose (Fluad)    -USPSTF grade A and B recommendations reviewed with patient; age-appropriate recommendations, preventive care, screening tests, etc discussed and encouraged; healthy living encouraged; see AVS for patient education given to patient -Discussed importance of 150 minutes of physical activity weekly, eat two servings of fish weekly, eat one serving of tree nuts ( cashews, pistachios, pecans, almonds.Marland Kitchen) every other day, eat 6 servings of fruit/vegetables daily and drink plenty of water and avoid sweet beverages.   -Reviewed Health Maintenance: Yes.

## 2022-11-26 ENCOUNTER — Other Ambulatory Visit: Payer: Self-pay

## 2022-11-26 ENCOUNTER — Encounter: Payer: Self-pay | Admitting: Family Medicine

## 2022-11-26 ENCOUNTER — Ambulatory Visit (INDEPENDENT_AMBULATORY_CARE_PROVIDER_SITE_OTHER): Payer: No Typology Code available for payment source | Admitting: Family Medicine

## 2022-11-26 VITALS — BP 118/68 | HR 91 | Resp 16 | Ht 61.0 in | Wt 148.0 lb

## 2022-11-26 DIAGNOSIS — Z0000 Encounter for general adult medical examination without abnormal findings: Secondary | ICD-10-CM | POA: Diagnosis not present

## 2022-11-26 DIAGNOSIS — E538 Deficiency of other specified B group vitamins: Secondary | ICD-10-CM | POA: Diagnosis not present

## 2022-11-26 DIAGNOSIS — Z1231 Encounter for screening mammogram for malignant neoplasm of breast: Secondary | ICD-10-CM

## 2022-11-26 DIAGNOSIS — Z23 Encounter for immunization: Secondary | ICD-10-CM | POA: Diagnosis not present

## 2022-11-26 DIAGNOSIS — R739 Hyperglycemia, unspecified: Secondary | ICD-10-CM | POA: Diagnosis not present

## 2022-11-26 DIAGNOSIS — Z79899 Other long term (current) drug therapy: Secondary | ICD-10-CM

## 2022-11-26 DIAGNOSIS — Z0001 Encounter for general adult medical examination with abnormal findings: Secondary | ICD-10-CM

## 2022-11-26 DIAGNOSIS — E785 Hyperlipidemia, unspecified: Secondary | ICD-10-CM

## 2022-11-26 DIAGNOSIS — M545 Low back pain, unspecified: Secondary | ICD-10-CM

## 2022-11-26 DIAGNOSIS — R7989 Other specified abnormal findings of blood chemistry: Secondary | ICD-10-CM

## 2022-11-26 MED ORDER — CYCLOBENZAPRINE HCL 5 MG PO TABS: 5.0000 mg | ORAL_TABLET | Freq: Every day | ORAL | 0 refills | Status: DC

## 2022-11-26 NOTE — Patient Instructions (Addendum)
Need tdap, RSV and shingrix

## 2022-11-27 LAB — COMPLETE METABOLIC PANEL WITH GFR
AG Ratio: 1.7 (calc) (ref 1.0–2.5)
ALT: 20 U/L (ref 6–29)
AST: 22 U/L (ref 10–35)
Albumin: 4.7 g/dL (ref 3.6–5.1)
Alkaline phosphatase (APISO): 69 U/L (ref 37–153)
BUN: 11 mg/dL (ref 7–25)
CO2: 26 mmol/L (ref 20–32)
Calcium: 9.8 mg/dL (ref 8.6–10.4)
Chloride: 103 mmol/L (ref 98–110)
Creat: 0.72 mg/dL (ref 0.60–1.00)
Globulin: 2.8 g/dL (ref 1.9–3.7)
Glucose, Bld: 121 mg/dL — ABNORMAL HIGH (ref 65–99)
Potassium: 4.3 mmol/L (ref 3.5–5.3)
Sodium: 140 mmol/L (ref 135–146)
Total Bilirubin: 0.5 mg/dL (ref 0.2–1.2)
Total Protein: 7.5 g/dL (ref 6.1–8.1)
eGFR: 89 mL/min/{1.73_m2} (ref >=60)

## 2022-11-27 LAB — CBC WITH DIFFERENTIAL/PLATELET
Absolute Lymphocytes: 2278 {cells}/uL (ref 850–3900)
Absolute Monocytes: 668 {cells}/uL (ref 200–950)
Basophils Absolute: 45 {cells}/uL (ref 0–200)
Basophils Relative: 0.5 %
Eosinophils Absolute: 134 {cells}/uL (ref 15–500)
Eosinophils Relative: 1.5 %
HCT: 45.1 % — ABNORMAL HIGH (ref 35.0–45.0)
Hemoglobin: 14.8 g/dL (ref 11.7–15.5)
MCH: 29 pg (ref 27.0–33.0)
MCHC: 32.8 g/dL (ref 32.0–36.0)
MCV: 88.3 fL (ref 80.0–100.0)
MPV: 9.2 fL (ref 7.5–12.5)
Monocytes Relative: 7.5 %
Neutro Abs: 5776 {cells}/uL (ref 1500–7800)
Neutrophils Relative %: 64.9 %
Platelets: 348 10*3/uL (ref 140–400)
RBC: 5.11 10*6/uL — ABNORMAL HIGH (ref 3.80–5.10)
RDW: 12.4 % (ref 11.0–15.0)
Total Lymphocyte: 25.6 %
WBC: 8.9 10*3/uL (ref 3.8–10.8)

## 2022-11-27 LAB — B12 AND FOLATE PANEL
Folate: 8.7 ng/mL
Vitamin B-12: 457 pg/mL (ref 200–1100)

## 2022-11-27 LAB — LIPID PANEL
Cholesterol: 172 mg/dL (ref <200)
HDL: 63 mg/dL (ref >=50)
LDL Cholesterol (Calc): 80 mg/dL
Non-HDL Cholesterol (Calc): 109 mg/dL (ref <130)
Total CHOL/HDL Ratio: 2.7 (calc) (ref <5.0)
Triglycerides: 194 mg/dL — ABNORMAL HIGH (ref <150)

## 2022-11-27 LAB — PARATHYROID HORMONE, INTACT (NO CA): PTH: 84 pg/mL — ABNORMAL HIGH (ref 16–77)

## 2022-11-27 LAB — HEMOGLOBIN A1C
Hgb A1c MFr Bld: 6.6 %{Hb} — ABNORMAL HIGH (ref <5.7)
Mean Plasma Glucose: 143 mg/dL
eAG (mmol/L): 7.9 mmol/L

## 2022-11-27 LAB — VITAMIN D 25 HYDROXY (VIT D DEFICIENCY, FRACTURES): Vit D, 25-Hydroxy: 55 ng/mL (ref 30–100)

## 2022-12-05 DIAGNOSIS — H5203 Hypermetropia, bilateral: Secondary | ICD-10-CM | POA: Diagnosis not present

## 2022-12-05 DIAGNOSIS — Z961 Presence of intraocular lens: Secondary | ICD-10-CM | POA: Diagnosis not present

## 2022-12-05 DIAGNOSIS — H52223 Regular astigmatism, bilateral: Secondary | ICD-10-CM | POA: Diagnosis not present

## 2022-12-05 DIAGNOSIS — Z135 Encounter for screening for eye and ear disorders: Secondary | ICD-10-CM | POA: Diagnosis not present

## 2022-12-05 DIAGNOSIS — H04123 Dry eye syndrome of bilateral lacrimal glands: Secondary | ICD-10-CM | POA: Diagnosis not present

## 2022-12-05 DIAGNOSIS — H524 Presbyopia: Secondary | ICD-10-CM | POA: Diagnosis not present

## 2022-12-14 NOTE — Progress Notes (Unsigned)
Name: Elizabeth Johnson   MRN: [id number]    DOB: 1950-03-23   Date:12/17/2022       Progress Note  Subjective  Chief Complaint  Medication Refill  HPI  History of Present Illness The patient, with a history of heart attack, angina, high cholesterol, and major depression, presents for a regular follow-up. She reports a stable blood pressure at home, typically around 118-119, which is on the lower end of normal. She is currently on Lisinopril and Carvedilol for blood pressure management.  The patient denies any chest pain or shortness of breath and is able to maintain an active lifestyle. She is compliant with her medication regimen, which includes a statin, ezetimibe, Lisinopril, and aspirin. She has not seen her cardiologist in a while, as she was advised to only follow-up if she starts feeling different or has issues.  The patient also has a history of high cholesterol, which is managed with medications. However, her recent blood work showed an increase in her bad cholesterol from 71 to 80. She denies any changes in her diet or medication adherence.  In addition to her physical health, the patient has a long history of major depression, which worsened after the death of her dogs. She is currently on Wellbutrin XL 300 mg for her depression, which she feels helps somewhat, but she believes it could do better. She is not currently taking any sleep medication, but she does have Alprazolam for anxiety, which she takes very seldom.  The patient also reports intermittent back spasms, which are not shooting down her legs. She takes cyclobenzaprine for her back pain, which she reports helps and also aids in sleep.  Senile purpura, chronic , reassurance given   Recently, the patient was diagnosed with diabetes after her A1C levels were found to be high two times in a row. She plans to manage her diabetes through diet changes and physical activity. She has been advised to monitor her feet daily for any  changes and to inform her eye doctor about her diabetes.  Lastly, the patient has a history of osteopenia, which is being monitored. Her recent bone density test showed some osteopenia, but not quite osteoporosis. Her parathyroid level has increased slightly from the last time. She has decided to wait before seeing an endocrinologist. Physical Exam    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   Biceps tendon rupture 09/21/2015   H/O hysterectomy for benign disease 09/02/2014   Acquired bilateral hammer toes 09/02/2014   External hemorrhoid 09/02/2014   Allergic rhinitis 07/19/2014   Carpal tunnel syndrome on right 07/19/2014   Other insomnia 07/19/2014   Intermittent low back pain 07/19/2014   Mild major depression (HCC) 07/19/2014   Dyslipidemia 07/19/2014   Hemorrhoid 07/19/2014   Dysmetabolic syndrome 07/19/2014   Coronary artery disease involving left main coronary artery 07/19/2014   Osteopenia 07/19/2014   Benign essential HTN 02/04/2009   H/O acute myocardial infarction of lateral wall 07/18/2004    Past Surgical History:  Procedure Laterality Date   ABDOMINAL HYSTERECTOMY     BUNIONECTOMY Bilateral 1973   CARPAL TUNNEL RELEASE Right 04/15/2017   Procedure: CARPAL TUNNEL RELEASE;  Surgeon: Deeann Saint, MD;  Location: [facility] ORS;  Service: Orthopedics;  Laterality: Right;   CATARACT EXTRACTION, BILATERAL  05/21 & 06/21   COLONOSCOPY WITH PROPOFOL N/A 11/23/2016   Procedure: COLONOSCOPY WITH PROPOFOL;  Surgeon: Wyline Mood, MD;  Location: [facility] ENDOSCOPY;  Service: Gastroenterology;  Laterality: N/A;   COLONOSCOPY WITH PROPOFOL N/A 11/03/2021   Procedure: COLONOSCOPY WITH PROPOFOL;  Surgeon: Wyline Mood,  MD;  Location: [facility] ENDOSCOPY;  Service: Gastroenterology;  Laterality: N/A;   CORONARY STENT PLACEMENT  2009   ELBOW SURGERY Left 1977   HAMMER TOE SURGERY Right 1992   RIB FRACTURE SURGERY     WRIST FRACTURE SURGERY Right 1977    Family History  Problem Relation Age of Onset   Alzheimer's disease Mother    Breast cancer Mother 82   CAD Father    Heart attack Father    Heart failure Father    COPD Father    Drug abuse Sister    Suicidality Sister    Heart attack Brother     Social History   Tobacco Use   Smoking status: Former    Current packs/day: 0.00    Average packs/day: 0.3 packs/day for 8.3 years (2.1 ttl pk-yrs)    Types: Cigarettes    Start date: 01/23/1999    Quit date: 05/23/2007    Years since quitting: 15.5   Smokeless tobacco: Never  Substance Use Topics   Alcohol use: Yes    Alcohol/week: 0.0 standard drinks of alcohol    Comment: occasional     Current Outpatient Medications:    ALPRAZolam (XANAX) 0.5 MG tablet, Take 1 tablet (0.5 mg total) by mouth daily as needed. for anxiety, Disp: 30 tablet, Rfl: 0   aspirin 81 MG chewable tablet, Chew 81 mg by mouth daily. , Disp: , Rfl:    buPROPion (WELLBUTRIN XL) 300 MG 24 hr tablet, Take 1 tablet (300 mg total) by mouth daily., Disp: 90 tablet, Rfl: 1   carvedilol (COREG) 3.125 MG tablet, Take 1 tablet (3.125 mg total) by mouth 2 (two) times daily with a meal., Disp: 180 tablet, Rfl: 1   cholecalciferol (VITAMIN D3) 25 MCG (1000 UNIT) tablet, Take 1,000 Units by mouth daily., Disp: , Rfl:    cyclobenzaprine (FLEXERIL) 5 MG tablet, Take 1 tablet (5 mg total) by mouth at bedtime., Disp: 90 tablet, Rfl: 0   ezetimibe (ZETIA) 10 MG tablet, Take 1 tablet (10 mg total) by mouth daily., Disp: 90 tablet, Rfl: 1   lisinopril (ZESTRIL) 20 MG tablet, Take 1 tablet (20 mg total) by mouth daily., Disp: 90 tablet, Rfl: 1   nitroGLYCERIN (NITROSTAT) 0.4 MG SL tablet, DISSOLVE 1 TABLET UNDER THE TONGUE EVERY 5 MINUTES AS  NEEDED  FOR CHEST PAIN. MAX  OF 3 TABLETS IN 15 MINUTES. CALL 911 IF PAIN PERSISTS., Disp: 100 tablet, Rfl: 3   rosuvastatin (CRESTOR) 20 MG tablet, Take 1 tablet (20 mg total) by mouth daily., Disp: 90 tablet, Rfl: 1  Allergies  Allergen Reactions   Codeine Other (See Comments)    Sensitivity. Nauseated     I personally reviewed active problem list, medication list, allergies, family history, social history, health maintenance with the patient/caregiver today.   ROS  Ten systems reviewed and is negative except as mentioned in HPI    Objective  Vitals:   12/17/22 1131  BP: 114/66  Pulse: 84  Resp: 14  Temp: 97.9 F (36.6 C)  TempSrc: Oral  SpO2: 97%  Weight: 147 lb 9.6 oz (67 kg)  Height: 5' 1.5" (1.562 m)  Body mass index is 27.44 kg/m.  Physical Exam  Constitutional: Patient appears well-developed and well-nourished.  No distress.  HEENT: head atraumatic, normocephalic, pupils equal and reactive to light, neck supple, throat within normal limits Cardiovascular: Normal rate, regular rhythm and normal heart sounds.  No murmur heard. No BLE edema. Pulmonary/Chest: Effort normal and breath sounds normal. No respiratory distress. Abdominal: Soft.  There is no tenderness. Psychiatric: Patient has a normal mood and affect. behavior is normal. Judgment and thought content normal.    PHQ2/9:    12/17/2022   11:35 AM 11/26/2022   10:44 AM 11/29/2021   10:42 AM 11/20/2021    3:08 PM 04/11/2021   11:18 AM  Depression screen PHQ 2/9  Decreased Interest 0 0 1 2 0  Down, Depressed, Hopeless 0 0 2 2 3   PHQ - 2 Score 0 0 3 4 3   Altered sleeping 1 0 0 2 0  Tired, decreased energy 1 0 0 2 0  Change in appetite 0 0 0 0 0  Feeling bad or failure about yourself  0 0 0 0 0  Trouble concentrating 0 0 0 0 0  Moving slowly or fidgety/restless 0 0 0 2 0  Suicidal thoughts 0 0 0 0 0  PHQ-9 Score 2 0 3 10 3   Difficult doing work/chores Not difficult at all   Somewhat difficult     phq 9  is negative   Fall Risk:    12/17/2022   11:32 AM 11/26/2022   10:44 AM 11/29/2021   10:41 AM 11/20/2021    3:09 PM 04/11/2021   11:18 AM  Fall Risk   Falls in the past year? 0 0 0 0 0  Number falls in past yr:  0 0  0  Injury with Fall?  0 0  0  Risk for fall due to : No Fall Risks No Fall Risks No Fall Risks No Fall Risks No Fall Risks  Follow up Falls prevention discussed Falls prevention discussed Falls prevention discussed Falls prevention discussed;Education provided;Falls evaluation completed Falls prevention discussed      Functional Status Survey: Is the patient deaf or have difficulty hearing?: No Does the patient have difficulty seeing, even when wearing glasses/contacts?: No Does the patient have difficulty concentrating, remembering, or making decisions?: No Does the patient have difficulty walking or climbing stairs?: No Does the patient have difficulty dressing or bathing?: No Does the patient have difficulty doing errands alone such as visiting a doctor's office or shopping?: No    Assessment & Plan  Hypertension Blood pressure trending towards lower end of normal. Discussed risks of hypotension in older age and the need to maintain blood pressure between 120s and 130s. -Reduce Lisinopril from 20mg  to 10mg  daily. -Monitor blood pressure at home and return for nurse visit for blood pressure check.  Hyperlipidemia LDL increased from 71 to 80 despite adherence to statin and ezetimibe therapy. -Increase Rosuvastatin from 20mg  to 40mg  daily. -Continue Ezetimibe.  Major Depression Chronic condition, currently managed with Wellbutrin XL 300mg  daily. Patient reports some improvement but feels it could be better. -Add Lexapro to current regimen, starting in the morning and adjusting timing based on patient's response.  Anxiety Intermittent episodes, managed with Alprazolam as needed. -Continue Alprazolam as needed.  Back Pain Intermittent spasms, no radicular  symptoms. Managed with Cyclobenzaprine as needed. -Continue Cyclobenzaprine as needed.  New Onset Diabetes A1C increased from 6.5 to 6.6, indicating new onset diabetes. Currently diet-controlled. -Encourage patient to maintain diet and  physical activity. -Perform annual foot exam and check for proteinuria.  Osteopenia Primary hyperparathyroidism  Stable, with slightly elevated parathyroid hormone (PTH) level. -Consider referral to endocrinology if PTH continues to rise or if bone density worsens.  Medication Management Patient wishes to change pharmacy for certain medications. -Send prescriptions for Wellbutrin and Ezetimibe to preferred pharmacy.

## 2022-12-17 ENCOUNTER — Encounter: Payer: Self-pay | Admitting: Family Medicine

## 2022-12-17 ENCOUNTER — Ambulatory Visit (INDEPENDENT_AMBULATORY_CARE_PROVIDER_SITE_OTHER): Payer: No Typology Code available for payment source | Admitting: Family Medicine

## 2022-12-17 VITALS — BP 114/66 | HR 84 | Temp 97.9°F | Resp 14 | Ht 61.5 in | Wt 147.6 lb

## 2022-12-17 DIAGNOSIS — I2510 Atherosclerotic heart disease of native coronary artery without angina pectoris: Secondary | ICD-10-CM | POA: Diagnosis not present

## 2022-12-17 DIAGNOSIS — E1159 Type 2 diabetes mellitus with other circulatory complications: Secondary | ICD-10-CM | POA: Diagnosis not present

## 2022-12-17 DIAGNOSIS — E785 Hyperlipidemia, unspecified: Secondary | ICD-10-CM | POA: Diagnosis not present

## 2022-12-17 DIAGNOSIS — F320 Major depressive disorder, single episode, mild: Secondary | ICD-10-CM

## 2022-12-17 DIAGNOSIS — E210 Primary hyperparathyroidism: Secondary | ICD-10-CM | POA: Diagnosis not present

## 2022-12-17 DIAGNOSIS — E119 Type 2 diabetes mellitus without complications: Secondary | ICD-10-CM | POA: Diagnosis not present

## 2022-12-17 DIAGNOSIS — G4709 Other insomnia: Secondary | ICD-10-CM

## 2022-12-17 DIAGNOSIS — M545 Low back pain, unspecified: Secondary | ICD-10-CM

## 2022-12-17 DIAGNOSIS — D692 Other nonthrombocytopenic purpura: Secondary | ICD-10-CM

## 2022-12-17 DIAGNOSIS — I10 Essential (primary) hypertension: Secondary | ICD-10-CM | POA: Diagnosis not present

## 2022-12-17 MED ORDER — BUPROPION HCL ER (XL) 300 MG PO TB24: 300.0000 mg | ORAL_TABLET | Freq: Every day | ORAL | 1 refills | Status: DC

## 2022-12-17 MED ORDER — ALPRAZOLAM 0.5 MG PO TABS: 0.5000 mg | ORAL_TABLET | Freq: Every day | ORAL | 0 refills | Status: DC | PRN

## 2022-12-17 MED ORDER — EZETIMIBE 10 MG PO TABS: 10.0000 mg | ORAL_TABLET | Freq: Every day | ORAL | 1 refills | Status: DC

## 2022-12-17 MED ORDER — ROSUVASTATIN CALCIUM 40 MG PO TABS: 40.0000 mg | ORAL_TABLET | Freq: Every day | ORAL | 1 refills | Status: DC

## 2022-12-17 MED ORDER — CYCLOBENZAPRINE HCL 5 MG PO TABS: 5.0000 mg | ORAL_TABLET | Freq: Every day | ORAL | 1 refills | Status: DC

## 2022-12-17 MED ORDER — ESCITALOPRAM OXALATE 10 MG PO TABS: 10.0000 mg | ORAL_TABLET | Freq: Every day | ORAL | 1 refills | Status: DC

## 2022-12-17 MED ORDER — LISINOPRIL 10 MG PO TABS: 10.0000 mg | ORAL_TABLET | Freq: Every day | ORAL | 1 refills | Status: DC

## 2022-12-17 MED ORDER — CARVEDILOL 3.125 MG PO TABS: 3.1250 mg | ORAL_TABLET | Freq: Two times a day (BID) | ORAL | 1 refills | Status: DC

## 2022-12-18 LAB — MICROALBUMIN / CREATININE URINE RATIO
Creatinine, Urine: 195 mg/dL (ref 20–275)
Microalb Creat Ratio: 9 mg/g{creat} (ref <30)
Microalb, Ur: 1.7 mg/dL

## 2023-01-21 DIAGNOSIS — H52223 Regular astigmatism, bilateral: Secondary | ICD-10-CM | POA: Diagnosis not present

## 2023-01-21 DIAGNOSIS — H524 Presbyopia: Secondary | ICD-10-CM | POA: Diagnosis not present

## 2023-03-26 ENCOUNTER — Telehealth: Payer: Self-pay | Admitting: Family Medicine

## 2023-03-26 NOTE — Telephone Encounter (Signed)
 Copied from CRM [id number]. Topic: Medical Record Request - Provider/Facility Request >> Mar 25, 2023 11:17 AM Hamdi H wrote: Reason for CRM: [facility] called looking for Dr. Carlynn Purl credentials, please give Provider Services a call back at [phone].

## 2023-03-26 NOTE — Telephone Encounter (Signed)
 Called back no succes

## 2023-04-15 ENCOUNTER — Ambulatory Visit
Admission: RE | Admit: 2023-04-15 | Discharge: 2023-04-15 | Disposition: A | Discharge status: Home / Self Care | Payer: No Typology Code available for payment source | Source: Ambulatory Visit | Attending: Family Medicine | Admitting: Family Medicine

## 2023-04-15 DIAGNOSIS — Z1231 Encounter for screening mammogram for malignant neoplasm of breast: Secondary | ICD-10-CM | POA: Diagnosis not present

## 2023-04-22 ENCOUNTER — Telehealth: Payer: Self-pay | Admitting: Family Medicine

## 2023-04-22 NOTE — Telephone Encounter (Signed)
 Patient has enough supple till May 2025

## 2023-04-22 NOTE — Telephone Encounter (Signed)
 [facility]   ezetimibe (ZETIA) 10 MG tablet   buPROPion (WELLBUTRIN XL) 300 MG 24 hr tablet

## 2023-04-25 ENCOUNTER — Other Ambulatory Visit: Payer: Self-pay | Admitting: Family Medicine

## 2023-04-25 DIAGNOSIS — I2510 Atherosclerotic heart disease of native coronary artery without angina pectoris: Secondary | ICD-10-CM

## 2023-04-25 DIAGNOSIS — E785 Hyperlipidemia, unspecified: Secondary | ICD-10-CM

## 2023-04-25 DIAGNOSIS — F320 Major depressive disorder, single episode, mild: Secondary | ICD-10-CM

## 2023-04-25 NOTE — Telephone Encounter (Signed)
 Copied from CRM [id number]. Topic: Clinical - Medication Refill >> Apr 25, 2023  1:48 PM Shon Hale wrote: Most Recent Primary Care Visit:  Provider: Alba Cory  Department: [facility]  Visit Type: OFFICE VISIT  Date: 12/17/2022  Medication: buPROPion (WELLBUTRIN XL) 300 MG 24 hr tablet ezetimibe (ZETIA) 10 MG tablet Pt out of the ezetimibe and bupropion, patient has been out of rx  for a few weeks. Pt informed by [facility] that they no longer carry the prescription. Pt requesting prescriptions to be sent to pharmacy below.   Has the patient contacted their pharmacy? Yes  Is this the correct pharmacy for this prescription? Yes This is the patient's preferred pharmacy:  [facility] - [place], [place] - [address] Portal to Registered Caremark Sites [address] [place] [address] Phone: [phone] Fax: [phone]   Has the prescription been filled recently? No  Is the patient out of the medication? Yes  Has the patient been seen for an appointment in the last year OR does the patient have an upcoming appointment? No  Can we respond through MyChart? Yes  Agent: Please be advised that Rx refills may take up to 3 business days. We ask that you follow-up with your pharmacy.

## 2023-04-26 MED ORDER — BUPROPION HCL ER (XL) 300 MG PO TB24: 300.0000 mg | ORAL_TABLET | Freq: Every day | ORAL | 0 refills | Status: DC

## 2023-04-26 MED ORDER — EZETIMIBE 10 MG PO TABS: 10.0000 mg | ORAL_TABLET | Freq: Every day | ORAL | 0 refills | Status: DC

## 2023-04-26 NOTE — Telephone Encounter (Signed)
 Requested Prescriptions  Pending Prescriptions Disp Refills   buPROPion (WELLBUTRIN XL) 300 MG 24 hr tablet 90 tablet 0    Sig: Take 1 tablet (300 mg total) by mouth daily.     Psychiatry: Antidepressants - bupropion Failed - 04/26/2023 12:40 PM      Failed - Valid encounter within last 6 months    Recent Outpatient Visits   None            Passed - Cr in normal range and within 360 days    Creat  Date Value Ref Range Status  11/26/2022 0.72 0.60 - 1.00 mg/dL Final   Creatinine, Urine  Date Value Ref Range Status  12/17/2022 195 20 - 275 mg/dL Final         Passed - AST in normal range and within 360 days    AST  Date Value Ref Range Status  11/26/2022 22 10 - 35 U/L Final         Passed - ALT in normal range and within 360 days    ALT  Date Value Ref Range Status  11/26/2022 20 6 - 29 U/L Final         Passed - Completed PHQ-2 or PHQ-9 in the last 360 days      Passed - Last BP in normal range    BP Readings from Last 1 Encounters:  12/17/22 114/66          ezetimibe (ZETIA) 10 MG tablet 90 tablet 0    Sig: Take 1 tablet (10 mg total) by mouth daily.     Cardiovascular:  Antilipid - Sterol Transport Inhibitors Failed - 04/26/2023 12:40 PM      Failed - Valid encounter within last 12 months    Recent Outpatient Visits   None            Failed - Lipid Panel in normal range within the last 12 months    Cholesterol, Total  Date Value Ref Range Status  08/26/2014 170 100 - 199 mg/dL Final   Cholesterol  Date Value Ref Range Status  11/26/2022 172 <200 mg/dL Final   LDL Cholesterol (Calc)  Date Value Ref Range Status  11/26/2022 80 mg/dL (calc) Final    Comment:    Reference range: <100 . Desirable range <100 mg/dL for primary prevention;   <70 mg/dL for patients with CHD or diabetic patients  with > or = 2 CHD risk factors. Marland Kitchen LDL-C is now calculated using the Martin-Hopkins  calculation, which is a validated novel method providing  better  accuracy than the Friedewald equation in the  estimation of LDL-C.  Horald Pollen et al. Lenox Ahr. 1610;960(45): 2061-2068  (http://education.[facility].com/faq/FAQ164)    HDL  Date Value Ref Range Status  11/26/2022 63 > OR = 50 mg/dL Final  40/98/1191 66 >47 mg/dL Final    Comment:    According to ATP-III Guidelines, HDL-C >59 mg/dL is considered a negative risk factor for CHD.    Triglycerides  Date Value Ref Range Status  11/26/2022 194 (H) <150 mg/dL Final         Passed - AST in normal range and within 360 days    AST  Date Value Ref Range Status  11/26/2022 22 10 - 35 U/L Final         Passed - ALT in normal range and within 360 days    ALT  Date Value Ref Range Status  11/26/2022 20 6 - 29 U/L Final  Passed - Patient is not pregnant

## 2023-06-13 DIAGNOSIS — I10 Essential (primary) hypertension: Secondary | ICD-10-CM | POA: Diagnosis not present

## 2023-06-13 DIAGNOSIS — E785 Hyperlipidemia, unspecified: Secondary | ICD-10-CM | POA: Diagnosis not present

## 2023-06-13 DIAGNOSIS — F3341 Major depressive disorder, recurrent, in partial remission: Secondary | ICD-10-CM | POA: Diagnosis not present

## 2023-06-13 DIAGNOSIS — Z008 Encounter for other general examination: Secondary | ICD-10-CM | POA: Diagnosis not present

## 2023-06-13 DIAGNOSIS — E1159 Type 2 diabetes mellitus with other circulatory complications: Secondary | ICD-10-CM | POA: Diagnosis not present

## 2023-06-13 DIAGNOSIS — E663 Overweight: Secondary | ICD-10-CM | POA: Diagnosis not present

## 2023-06-13 DIAGNOSIS — E1169 Type 2 diabetes mellitus with other specified complication: Secondary | ICD-10-CM | POA: Diagnosis not present

## 2023-06-13 DIAGNOSIS — I25119 Atherosclerotic heart disease of native coronary artery with unspecified angina pectoris: Secondary | ICD-10-CM | POA: Diagnosis not present

## 2023-06-13 DIAGNOSIS — Z6826 Body mass index (BMI) 26.0-26.9, adult: Secondary | ICD-10-CM | POA: Diagnosis not present

## 2023-06-18 ENCOUNTER — Ambulatory Visit: Payer: Self-pay | Admitting: Family Medicine

## 2023-06-18 ENCOUNTER — Telehealth: Admitting: Family Medicine

## 2023-06-18 ENCOUNTER — Encounter: Payer: Self-pay | Admitting: Family Medicine

## 2023-06-18 ENCOUNTER — Ambulatory Visit: Admitting: Family Medicine

## 2023-06-18 DIAGNOSIS — G4709 Other insomnia: Secondary | ICD-10-CM

## 2023-06-18 DIAGNOSIS — I2510 Atherosclerotic heart disease of native coronary artery without angina pectoris: Secondary | ICD-10-CM

## 2023-06-18 DIAGNOSIS — M545 Low back pain, unspecified: Secondary | ICD-10-CM | POA: Diagnosis not present

## 2023-06-18 DIAGNOSIS — F339 Major depressive disorder, recurrent, unspecified: Secondary | ICD-10-CM | POA: Diagnosis not present

## 2023-06-18 DIAGNOSIS — E1169 Type 2 diabetes mellitus with other specified complication: Secondary | ICD-10-CM | POA: Diagnosis not present

## 2023-06-18 DIAGNOSIS — E785 Hyperlipidemia, unspecified: Secondary | ICD-10-CM

## 2023-06-18 DIAGNOSIS — E210 Primary hyperparathyroidism: Secondary | ICD-10-CM | POA: Diagnosis not present

## 2023-06-18 DIAGNOSIS — E538 Deficiency of other specified B group vitamins: Secondary | ICD-10-CM

## 2023-06-18 DIAGNOSIS — F320 Major depressive disorder, single episode, mild: Secondary | ICD-10-CM

## 2023-06-18 DIAGNOSIS — I10 Essential (primary) hypertension: Secondary | ICD-10-CM | POA: Diagnosis not present

## 2023-06-18 DIAGNOSIS — M8588 Other specified disorders of bone density and structure, other site: Secondary | ICD-10-CM

## 2023-06-18 MED ORDER — LISINOPRIL 10 MG PO TABS
10.0000 mg | ORAL_TABLET | Freq: Every day | ORAL | 1 refills | Status: DC
Start: 2023-06-18 — End: 2023-11-27

## 2023-06-18 MED ORDER — ROSUVASTATIN CALCIUM 40 MG PO TABS: 40.0000 mg | ORAL_TABLET | Freq: Every day | ORAL | 1 refills | Status: DC

## 2023-06-18 MED ORDER — SERTRALINE HCL 50 MG PO TABS
50.0000 mg | ORAL_TABLET | Freq: Every day | ORAL | 1 refills | Status: DC
Start: 2023-06-18 — End: 2023-11-27

## 2023-06-18 MED ORDER — CYCLOBENZAPRINE HCL 5 MG PO TABS: 5.0000 mg | ORAL_TABLET | Freq: Every day | ORAL | 1 refills | Status: DC

## 2023-06-18 MED ORDER — BUPROPION HCL ER (XL) 300 MG PO TB24
300.0000 mg | ORAL_TABLET | Freq: Every day | ORAL | 1 refills | Status: DC
Start: 2023-06-18 — End: 2023-11-27

## 2023-06-18 MED ORDER — EZETIMIBE 10 MG PO TABS: 10.0000 mg | ORAL_TABLET | Freq: Every day | ORAL | 1 refills | Status: DC

## 2023-06-18 MED ORDER — CARVEDILOL 3.125 MG PO TABS
3.1250 mg | ORAL_TABLET | Freq: Two times a day (BID) | ORAL | 1 refills | Status: DC
Start: 2023-06-18 — End: 2023-11-27

## 2023-06-18 NOTE — Progress Notes (Signed)
 Name: Elizabeth Johnson   MRN: [id number]    DOB: [birth year]   Date:06/18/2023       Progress Note  Subjective  Chief Complaint  Chief Complaint  Patient presents with   Medical Management of Chronic Issues    I connected with  Elizabeth Johnson  on 06/18/23 at  1:20 PM EDT by a video enabled telemedicine application and verified that I am speaking with the correct person using two identifiers.  I discussed the limitations of evaluation and management by telemedicine and the availability of in person appointments. The patient expressed understanding and agreed to proceed with the virtual visit  Staff also discussed with the patient that there may be a patient responsible charge related to this service. Patient Location: at home  Provider Location: [facility] Additional Individuals present: alone   Discussed the use of AI scribe software for clinical note transcription with the patient, who gave verbal consent to proceed.  History of Present Illness Elizabeth Johnson is a 73 year old female with coronary artery disease and hypertension who presents for a follow-up visit.  She has a history of coronary artery disease involving the left main coronary artery and angina, diagnosed in 2009, for which a stent was placed. She denies  current chest pain or palpitations and has not required nitroglycerin  in many years. Her current medications include rosuvastatin  40 mg, ezetimibe , carvedilol  3.125 mg twice daily, chewable aspirin 81 mg, and lisinopril  10 mg daily.  She has elevated parathyroid  hormone levels, previously recorded at 80 and 84, while calcium  levels remain normal. She has no symptoms such as kidney stones, reflux, or cramps. She has not seen Endo yet   She is currently taking Wellbutrin  300 mg for depression but stopped Lexapro  due to side effects. She feels down and lacks energy 'a lot of the time' and wants to adjust her medication regimen.  She has type 2 diabetes with a previous A1c of 6.6.  She has no symptoms such as increased hunger or urination. She monitors her blood pressure at home and reports it was good during a recent wellness visit.  She experiences intermittent low back pain and uses Flexeril  as needed, for which she requires a refill.  She has received one dose of the Shingrix  vaccine in 2023 and is due for the second dose. She is also due for a Tdap vaccine.    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   Biceps tendon rupture 09/21/2015   H/O hysterectomy for benign disease 09/02/2014   Acquired bilateral hammer toes 09/02/2014   External hemorrhoid 09/02/2014   Allergic rhinitis 07/19/2014   Carpal tunnel syndrome on right 07/19/2014   Other insomnia 07/19/2014   Intermittent low back pain 07/19/2014   Mild major depression (HCC) 07/19/2014   Dyslipidemia 07/19/2014   Hemorrhoid 07/19/2014   Dysmetabolic syndrome 07/19/2014   Coronary artery disease involving left main coronary artery 07/19/2014   Osteopenia 07/19/2014   Benign essential HTN 02/04/2009   H/O acute myocardial infarction of lateral wall 07/18/2004    Past Surgical History:  Procedure Laterality Date   ABDOMINAL HYSTERECTOMY     BUNIONECTOMY Bilateral 1973   CARPAL TUNNEL RELEASE Right 04/15/2017   Procedure:  CARPAL TUNNEL RELEASE;  Surgeon: Marlynn Singer, MD;  Location: [facility] ORS;  Service: Orthopedics;  Laterality: Right;   CATARACT EXTRACTION, BILATERAL  05/21 & 06/21   COLONOSCOPY WITH PROPOFOL  N/A 11/23/2016   Procedure: COLONOSCOPY WITH PROPOFOL ;  Surgeon: Luke Salaam, MD;  Location: [facility] ENDOSCOPY;  Service: Gastroenterology;  Laterality: N/A;   COLONOSCOPY WITH PROPOFOL  N/A 11/03/2021   Procedure: COLONOSCOPY  WITH PROPOFOL ;  Surgeon: Luke Salaam, MD;  Location: [facility] ENDOSCOPY;  Service: Gastroenterology;  Laterality: N/A;   CORONARY STENT PLACEMENT  2009   ELBOW SURGERY Left 1977   HAMMER TOE SURGERY Right 1992   RIB FRACTURE SURGERY     WRIST FRACTURE SURGERY Right 1977    Family History  Problem Relation Age of Onset   Alzheimer's disease Mother    Breast cancer Mother 75   CAD Father    Heart attack Father    Heart failure Father    COPD Father    Drug abuse Sister    Suicidality Sister    Heart attack Brother     Social History   Socioeconomic History   Marital status: Significant Other    Spouse name: Not on file   Number of children: 1   Years of education: Not on file   Highest education level: 12th grade  Occupational History   Occupation: retired   Tobacco Use   Smoking status: Former    Current packs/day: 0.00    Average packs/day: 0.3 packs/day for 8.3 years (2.1 ttl pk-yrs)    Types: Cigarettes    Start date: 01/23/1999    Quit date: 06/10/07    Years since quitting: 16.0   Smokeless tobacco: Never  Vaping Use   Vaping status: Never Used  Substance and Sexual Activity   Alcohol use: Yes    Alcohol/week: 0.0 standard drinks of alcohol    Comment: occasional   Drug use: No   Sexual activity: Yes    Partners: Male  Other Topics Concern   Not on file  Social History Narrative   She worked for the same company for 20 years and they closed [employer] and she was forced to retire Fall 2017. When her position was moved to [facility] with fiance for almost 30 years    She lost two of her dogs within 8 months, last dog died 05/19/23and she is still grieving.    Social Drivers of Corporate investment banker Strain: Low Risk  (06/17/2023)   Overall Financial Resource Strain (CARDIA)    Difficulty of Paying Living Expenses: Not hard at all  Food Insecurity: No Food Insecurity (06/17/2023)   Hunger Vital Sign    Worried About Running Out of Food in the Last  Year: Never true    Ran Out of Food in the Last Year: Never true  Transportation Needs: No Transportation Needs (06/17/2023)   PRAPARE - Administrator, Civil Service (Medical): No    Lack of Transportation (Non-Medical): No  Physical Activity: Insufficiently Active (06/17/2023)   Exercise Vital Sign    Days of Exercise per Week: 3 days    Minutes of Exercise per Session: 30 min  Stress: No Stress Concern Present (06/17/2023)   [employer] of Occupational Health - Occupational Stress Questionnaire    Feeling of Stress : Only a little  Social Connections: Moderately Isolated (06/17/2023)   Social Connection and Isolation Panel [NHANES]    Frequency of Communication with Friends and Family: Once  a week    Frequency of Social Gatherings with Friends and Family: Twice a week    Attends Religious Services: Never    Database administrator or Organizations: No    Attends Banker Meetings: Never    Marital Status: Living with partner  Intimate Partner Violence: Not At Risk (11/26/2022)   Humiliation, Afraid, Rape, and Kick questionnaire    Fear of Current or Ex-Partner: No    Emotionally Abused: No    Physically Abused: No    Sexually Abused: No     Current Outpatient Medications:    ALPRAZolam  (XANAX ) 0.5 MG tablet, Take 1 tablet (0.5 mg total) by mouth daily as needed. for anxiety, Disp: 30 tablet, Rfl: 0   aspirin 81 MG chewable tablet, Chew 81 mg by mouth daily. , Disp: , Rfl:    cholecalciferol (VITAMIN D3) 25 MCG (1000 UNIT) tablet, Take 1,000 Units by mouth daily., Disp: , Rfl:    nitroGLYCERIN  (NITROSTAT ) 0.4 MG SL tablet, DISSOLVE 1 TABLET UNDER THE TONGUE EVERY 5 MINUTES AS  NEEDED FOR CHEST PAIN. MAX  OF 3 TABLETS IN 15 MINUTES. CALL 911 IF PAIN PERSISTS., Disp: 100 tablet, Rfl: 3   sertraline (ZOLOFT) 50 MG tablet, Take 1 tablet (50 mg total) by mouth daily., Disp: 90 tablet, Rfl: 1   buPROPion  (WELLBUTRIN  XL) 300 MG 24 hr tablet, Take 1 tablet (300 mg  total) by mouth daily., Disp: 90 tablet, Rfl: 1   carvedilol  (COREG ) 3.125 MG tablet, Take 1 tablet (3.125 mg total) by mouth 2 (two) times daily with a meal., Disp: 180 tablet, Rfl: 1   cyclobenzaprine  (FLEXERIL ) 5 MG tablet, Take 1 tablet (5 mg total) by mouth at bedtime., Disp: 90 tablet, Rfl: 1   ezetimibe  (ZETIA ) 10 MG tablet, Take 1 tablet (10 mg total) by mouth daily., Disp: 90 tablet, Rfl: 1   lisinopril  (ZESTRIL ) 10 MG tablet, Take 1 tablet (10 mg total) by mouth daily., Disp: 90 tablet, Rfl: 1   rosuvastatin  (CRESTOR ) 40 MG tablet, Take 1 tablet (40 mg total) by mouth daily., Disp: 90 tablet, Rfl: 1  Allergies  Allergen Reactions   Codeine Other (See Comments)    Sensitivity. Nauseated     I personally reviewed active problem list, medication list, allergies with the patient/caregiver today.   ROS  Ten systems reviewed and is negative except as mentioned in HPI    Objective  Virtual encounter, vitals not obtained.  There is no height or weight on file to calculate BMI.  Physical Exam  Awake , alert and oriented   PHQ2/9:    06/18/2023    9:39 AM 12/17/2022   11:35 AM 11/26/2022   10:44 AM 11/29/2021   10:42 AM 11/20/2021    3:08 PM  Depression screen PHQ 2/9  Decreased Interest 0 0 0 1 2  Down, Depressed, Hopeless 2 0 0 2 2  PHQ - 2 Score 2 0 0 3 4  Altered sleeping 0 1 0 0 2  Tired, decreased energy 2 1 0 0 2  Change in appetite 0 0 0 0 0  Feeling bad or failure about yourself  1 0 0 0 0  Trouble concentrating 0 0 0 0 0  Moving slowly or fidgety/restless 0 0 0 0 2  Suicidal thoughts 0 0 0 0 0  PHQ-9 Score 5 2 0 3 10  Difficult doing work/chores Somewhat difficult Not difficult at all   Somewhat difficult   PHQ-2/9 Result is positive.  Fall Risk:    06/18/2023    9:38 AM 12/17/2022   11:32 AM 11/26/2022   10:44 AM 11/29/2021   10:41 AM 11/20/2021    3:09 PM  Fall Risk   Falls in the past year? 0 0 0 0 0  Number falls in past yr: 0  0 0   Injury  with Fall? 0  0 0   Risk for fall due to : No Fall Risks No Fall Risks No Fall Risks No Fall Risks No Fall Risks  Follow up Falls prevention discussed;Education provided;Falls evaluation completed Falls prevention discussed Falls prevention discussed Falls prevention discussed Falls prevention discussed;Education provided;Falls evaluation completed      Assessment & Plan Coronary artery disease Coronary artery disease with left main stent placed in 2009. No current symptoms. LDL cholesterol at 80 mg/dL; target below 70 mg/dL. - Continue rosuvastatin  40 mg daily. - Continue ezetimibe . - Monitor LDL cholesterol; target below 70 mg/dL. - Continue carvedilol  3.125 mg twice daily. - Continue aspirin 81 mg daily. - Continue lisinopril  10 mg daily.  Hypertension Hypertension well controlled with lisinopril  and carvedilol . - Continue lisinopril  10 mg daily. - Continue carvedilol  3.125 mg twice daily. - Monitor blood pressure regularly at home.  Type 2 diabetes mellitus with dyslipidemia  Type 2 diabetes mellitus with A1c of 6.6%. Requires regular monitoring and diabetic retinopathy screening. - Order A1c test within the next month. - Perform foot exam at next in-person visit. - Instruct to have eye exam for diabetic retinopathy. - Monitor A1c every 6 months.  Primary hyperparathyroidism Elevated parathyroid  hormone with normal calcium  levels. Referral to endocrinology recommended. - Refer to endocrinology for evaluation of primary hyperparathyroidism. - Monitor parathyroid  hormone and calcium  levels annually.  Depression Major Recurrent  Depression with Wellbutrin  300 mg. Symptoms include feeling down and lack of energy. Plan to add Zoloft. - Start Zoloft 50 mg daily. - Continue Wellbutrin  300 mg daily. - Reassess in 3 months or request refill if symptoms improve.  Intermittent low back pain Intermittent low back pain managed with Flexeril . - Refill Flexeril  as needed.  General  Health Maintenance Due for Tdap and Shingrix  vaccinations. Previous Shingrix  vaccination incomplete. - Administer Tdap vaccine at local pharmacy. - Administer second dose of Shingrix  vaccine at local pharmacy.     I discussed the assessment and treatment plan with the patient. The patient was provided an opportunity to ask questions and all were answered. The patient agreed with the plan and demonstrated an understanding of the instructions.  The patient was advised to call back or seek an in-person evaluation if the symptoms worsen or if the condition fails to improve as anticipated.  I provided 25  minutes of non-face-to-face time during this encounter.

## 2023-06-20 ENCOUNTER — Telehealth: Payer: Self-pay | Admitting: Pharmacy Technician

## 2023-06-20 ENCOUNTER — Other Ambulatory Visit (HOSPITAL_COMMUNITY): Payer: Self-pay

## 2023-06-20 DIAGNOSIS — M545 Low back pain, unspecified: Secondary | ICD-10-CM

## 2023-06-20 NOTE — Telephone Encounter (Signed)
 Pharmacy Patient Advocate Encounter   Received notification from CoverMyMeds that prior authorization for Cyclobenzaprine  HCl 5MG  tablets is required/requested.   Insurance verification completed.   The patient is insured through [facility] .   Per test claim: The current 30 day co-pay is, $1.36.  No PA needed at this time. This test claim was processed through [facility]- copay amounts may vary at other pharmacies due to pharmacy/plan contracts, or as the patient moves through the different stages of their insurance plan.     THE PRESCRIPTION WAS WRITTEN FOR #90 BUT THE INSURANCE WILL ONLY PAY FOR 30 DAY SUPPLY AT RETAIL LEVEL

## 2023-06-21 ENCOUNTER — Other Ambulatory Visit (HOSPITAL_COMMUNITY): Payer: Self-pay

## 2023-06-24 MED ORDER — CYCLOBENZAPRINE HCL 5 MG PO TABS
5.0000 mg | ORAL_TABLET | Freq: Every day | ORAL | 0 refills | Status: DC
Start: 2023-06-24 — End: 2023-06-24

## 2023-06-24 MED ORDER — CYCLOBENZAPRINE HCL 5 MG PO TABS: 5.0000 mg | ORAL_TABLET | Freq: Every day | ORAL | 1 refills | Status: AC

## 2023-06-24 NOTE — Telephone Encounter (Signed)
 Copied from CRM [id number]. Topic: Clinical - Prescription Issue >> Jun 24, 2023 10:44 AM Zipporah Him wrote: Reason for CRM: cyclobenzaprine  (FLEXERIL ) 5 MG tablet, [facility] states they need a 30 day prescription sent over not a 90 day as is requires a PA that was denied.  Patient has only 2 pills left.

## 2023-06-24 NOTE — Addendum Note (Signed)
 Addended by: Arleen Lacer F on: 06/24/2023 12:17 PM   Modules accepted: Orders

## 2023-06-25 ENCOUNTER — Other Ambulatory Visit (HOSPITAL_COMMUNITY): Payer: Self-pay

## 2023-06-27 ENCOUNTER — Other Ambulatory Visit (HOSPITAL_COMMUNITY): Payer: Self-pay

## 2023-08-15 ENCOUNTER — Other Ambulatory Visit: Payer: Self-pay | Admitting: Family Medicine

## 2023-08-15 ENCOUNTER — Telehealth: Payer: Self-pay

## 2023-08-15 DIAGNOSIS — G4709 Other insomnia: Secondary | ICD-10-CM

## 2023-08-15 NOTE — Telephone Encounter (Signed)
 Copied from CRM [id number]. Topic: Clinical - Request for Lab/Test Order >> Aug 15, 2023  4:18 PM Elizabeth Johnson wrote: Reason for CRM: Patient states she is supposed to have A1C done, would like to have her finger pricked only. Don't see orders in chart, please advise # [phone]

## 2023-08-15 NOTE — Telephone Encounter (Signed)
 Please advise and if due for any other labs?

## 2023-08-15 NOTE — Telephone Encounter (Unsigned)
 Copied from CRM [id number]. Topic: Clinical - Medication Refill >> Aug 15, 2023  1:27 PM Charlet HERO wrote: Medication: ALPRAZolam  (XANAX ) 0.5 MG tablet  Has the patient contacted their pharmacy? No narcotic  This is the patient's preferred pharmacy:  [facility] [place], [place] - [address] [facility] AT Portal to Registered [facility] [address] [place] [address] Phone: [phone] Fax: [phone]    Is this the correct pharmacy for this prescription? Yes If no, delete pharmacy and type the correct one.   Has the prescription been filled recently? Yes  Is the patient out of the medication? Yes  Has the patient been seen for an appointment in the last year OR does the patient have an upcoming appointment? Yes  Can we respond through MyChart? Yes  Agent: Please be advised that Rx refills may take up to 3 business days. We ask that you follow-up with your pharmacy.

## 2023-08-16 ENCOUNTER — Other Ambulatory Visit: Payer: Self-pay | Admitting: Family Medicine

## 2023-08-16 DIAGNOSIS — G4709 Other insomnia: Secondary | ICD-10-CM

## 2023-08-16 MED ORDER — ALPRAZOLAM 0.5 MG PO TABS: 0.5000 mg | ORAL_TABLET | Freq: Every day | ORAL | 0 refills | Status: AC | PRN

## 2023-08-16 NOTE — Telephone Encounter (Signed)
 Requested medications are due for refill today.  yes  Requested medications are on the active medications list.  yes  Last refill. 12/17/2022 #30 0 rf  Future visit scheduled.   yes  Notes to clinic.  Refill not delegated.    Requested Prescriptions  Pending Prescriptions Disp Refills   ALPRAZolam  (XANAX ) 0.5 MG tablet 30 tablet 0    Sig: Take 1 tablet (0.5 mg total) by mouth daily as needed. for anxiety     Not Delegated - Psychiatry: Anxiolytics/Hypnotics 2 Failed - 08/16/2023  3:33 PM      Failed - This refill cannot be delegated      Failed - Urine Drug Screen completed in last 360 days      Passed - Patient is not pregnant      Passed - Valid encounter within last 6 months    Recent Outpatient Visits           1 month ago Primary hyperparathyroidism [facility])   [facility] Sowles, Krichna, MD

## 2023-08-19 NOTE — Telephone Encounter (Signed)
 She thought she was supposed to recheck at last virtual visit?  It was 6.6 in Nov. And has not been rechecked since?

## 2023-10-30 LAB — HEMOGLOBIN A1C: Hemoglobin A1C: 6.6

## 2023-10-30 LAB — MICROALBUMIN / CREATININE URINE RATIO: Microalb Creat Ratio: 18

## 2023-11-18 DIAGNOSIS — E210 Primary hyperparathyroidism: Secondary | ICD-10-CM | POA: Insufficient documentation

## 2023-11-21 NOTE — Progress Notes (Signed)
 Elizabeth Johnson                                          MRN: [id number]   11/21/2023   The [facility] Quality Team Specialist reviewed this patient medical record for the purposes of chart review for care gap closure. The following were reviewed: chart review for care gap closure-kidney health evaluation for diabetes:eGFR  and uACR.    VBCI Quality Team

## 2023-11-27 ENCOUNTER — Encounter: Payer: Self-pay | Admitting: Family Medicine

## 2023-11-27 ENCOUNTER — Ambulatory Visit: Admitting: Family Medicine

## 2023-11-27 VITALS — BP 122/72 | HR 97 | Resp 16 | Ht 60.5 in | Wt 150.7 lb

## 2023-11-27 DIAGNOSIS — Z0001 Encounter for general adult medical examination with abnormal findings: Secondary | ICD-10-CM

## 2023-11-27 DIAGNOSIS — E1159 Type 2 diabetes mellitus with other circulatory complications: Secondary | ICD-10-CM | POA: Diagnosis not present

## 2023-11-27 DIAGNOSIS — M545 Low back pain, unspecified: Secondary | ICD-10-CM

## 2023-11-27 DIAGNOSIS — M8580 Other specified disorders of bone density and structure, unspecified site: Secondary | ICD-10-CM

## 2023-11-27 DIAGNOSIS — I2510 Atherosclerotic heart disease of native coronary artery without angina pectoris: Secondary | ICD-10-CM

## 2023-11-27 DIAGNOSIS — E538 Deficiency of other specified B group vitamins: Secondary | ICD-10-CM

## 2023-11-27 DIAGNOSIS — Z0000 Encounter for general adult medical examination without abnormal findings: Secondary | ICD-10-CM

## 2023-11-27 DIAGNOSIS — Z1231 Encounter for screening mammogram for malignant neoplasm of breast: Secondary | ICD-10-CM

## 2023-11-27 DIAGNOSIS — I152 Hypertension secondary to endocrine disorders: Secondary | ICD-10-CM

## 2023-11-27 DIAGNOSIS — E785 Hyperlipidemia, unspecified: Secondary | ICD-10-CM

## 2023-11-27 DIAGNOSIS — E1169 Type 2 diabetes mellitus with other specified complication: Secondary | ICD-10-CM

## 2023-11-27 DIAGNOSIS — Z23 Encounter for immunization: Secondary | ICD-10-CM

## 2023-11-27 DIAGNOSIS — F330 Major depressive disorder, recurrent, mild: Secondary | ICD-10-CM | POA: Diagnosis not present

## 2023-11-27 DIAGNOSIS — E210 Primary hyperparathyroidism: Secondary | ICD-10-CM

## 2023-11-27 MED ORDER — LISINOPRIL 10 MG PO TABS: 10.0000 mg | ORAL_TABLET | Freq: Every day | ORAL | 1 refills | Status: AC

## 2023-11-27 MED ORDER — CARVEDILOL 3.125 MG PO TABS: 3.1250 mg | ORAL_TABLET | Freq: Two times a day (BID) | ORAL | 1 refills | Status: AC

## 2023-11-27 MED ORDER — ROSUVASTATIN CALCIUM 40 MG PO TABS: 40.0000 mg | ORAL_TABLET | Freq: Every day | ORAL | 1 refills | Status: AC

## 2023-11-27 MED ORDER — SERTRALINE HCL 100 MG PO TABS: 100.0000 mg | ORAL_TABLET | Freq: Every day | ORAL | 1 refills | Status: AC

## 2023-11-27 MED ORDER — EZETIMIBE 10 MG PO TABS: 10.0000 mg | ORAL_TABLET | Freq: Every day | ORAL | 1 refills | Status: AC

## 2023-11-27 MED ORDER — BUPROPION HCL ER (XL) 300 MG PO TB24: 300.0000 mg | ORAL_TABLET | Freq: Every day | ORAL | 1 refills | Status: AC
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
## 2023-11-28 ENCOUNTER — Ambulatory Visit: Payer: Self-pay | Admitting: Family Medicine

## 2023-11-28 DIAGNOSIS — R531 Weakness: Secondary | ICD-10-CM

## 2023-11-28 DIAGNOSIS — D72829 Elevated white blood cell count, unspecified: Secondary | ICD-10-CM

## 2023-11-28 LAB — CBC WITH DIFFERENTIAL/PLATELET
Absolute Lymphocytes: 2611 {cells}/uL (ref 850–3900)
Absolute Monocytes: 952 {cells}/uL — ABNORMAL HIGH (ref 200–950)
Basophils Absolute: 37 {cells}/uL (ref 0–200)
Basophils Relative: 0.3 %
Eosinophils Absolute: 159 {cells}/uL (ref 15–500)
Eosinophils Relative: 1.3 %
HCT: 44.5 % (ref 35.0–45.0)
Hemoglobin: 14.3 g/dL (ref 11.7–15.5)
MCH: 28.6 pg (ref 27.0–33.0)
MCHC: 32.1 g/dL (ref 32.0–36.0)
MCV: 89 fL (ref 80.0–100.0)
MPV: 9.2 fL (ref 7.5–12.5)
Monocytes Relative: 7.8 %
Neutro Abs: 8442 {cells}/uL — ABNORMAL HIGH (ref 1500–7800)
Neutrophils Relative %: 69.2 %
Platelets: 355 Thousand/uL (ref 140–400)
RBC: 5 Million/uL (ref 3.80–5.10)
RDW: 13.4 % (ref 11.0–15.0)
Total Lymphocyte: 21.4 %
WBC: 12.2 Thousand/uL — ABNORMAL HIGH (ref 3.8–10.8)

## 2023-11-28 LAB — COMPREHENSIVE METABOLIC PANEL WITH GFR
AG Ratio: 1.7 (calc) (ref 1.0–2.5)
ALT: 17 U/L (ref 6–29)
AST: 22 U/L (ref 10–35)
Albumin: 4.7 g/dL (ref 3.6–5.1)
Alkaline phosphatase (APISO): 61 U/L (ref 37–153)
BUN: 17 mg/dL (ref 7–25)
CO2: 28 mmol/L (ref 20–32)
Calcium: 10.3 mg/dL (ref 8.6–10.4)
Chloride: 103 mmol/L (ref 98–110)
Creat: 0.85 mg/dL (ref 0.60–1.00)
Globulin: 2.8 g/dL (ref 1.9–3.7)
Glucose, Bld: 127 mg/dL — ABNORMAL HIGH (ref 65–99)
Potassium: 4.9 mmol/L (ref 3.5–5.3)
Sodium: 141 mmol/L (ref 135–146)
Total Bilirubin: 0.4 mg/dL (ref 0.2–1.2)
Total Protein: 7.5 g/dL (ref 6.1–8.1)
eGFR: 73 mL/min/1.73m2 (ref >=60)

## 2023-11-28 LAB — LIPID PANEL
Cholesterol: 131 mg/dL (ref <200)
HDL: 58 mg/dL (ref >=50)
LDL Cholesterol (Calc): 46 mg/dL
Non-HDL Cholesterol (Calc): 73 mg/dL (ref <130)
Total CHOL/HDL Ratio: 2.3 (calc) (ref <5.0)
Triglycerides: 200 mg/dL — ABNORMAL HIGH (ref <150)

## 2023-11-28 LAB — VITAMIN B12: Vitamin B-12: 441 pg/mL (ref 200–1100)

## 2023-11-28 LAB — PHOSPHORUS: Phosphorus: 4 mg/dL (ref 2.1–4.3)

## 2023-11-28 LAB — VITAMIN D 25 HYDROXY (VIT D DEFICIENCY, FRACTURES): Vit D, 25-Hydroxy: 52 ng/mL (ref 30–100)

## 2023-11-29 ENCOUNTER — Other Ambulatory Visit: Payer: Self-pay | Admitting: Family Medicine

## 2023-11-29 ENCOUNTER — Telehealth: Payer: Self-pay | Admitting: Family Medicine

## 2023-11-29 DIAGNOSIS — F320 Major depressive disorder, single episode, mild: Secondary | ICD-10-CM

## 2023-11-29 NOTE — Telephone Encounter (Signed)
 Copied from CRM #[id number]. Topic: Clinical - Medication Prior Auth >> Nov 29, 2023  1:28 PM Tiffini S wrote: Reason for CRM: Patient called stating that a prior authorization is needed for cyclobenzaprine  (FLEXERIL ) 5 MG tablet- went to pick up medicine at the pharmacy   Please call the patient at  [phone] for an update- patient would like to pick up prescription today

## 2023-11-29 NOTE — Telephone Encounter (Signed)
 Returned call, verbalized understanding. However has additional questions please advise and contact patient today.   Best contact: [phone]

## 2023-12-02 ENCOUNTER — Telehealth: Payer: Self-pay | Admitting: Pharmacy Technician

## 2023-12-02 ENCOUNTER — Other Ambulatory Visit (HOSPITAL_COMMUNITY): Payer: Self-pay

## 2023-12-02 NOTE — Telephone Encounter (Signed)
 Pharmacy Patient Advocate Encounter   Received notification from Pt Calls Messages that prior authorization for Cyclobenzaprine  HCl 5MG  tablets is required/requested.   Insurance verification completed.   The patient is insured through [facility].   Per test claim: PA required; PA submitted to above mentioned insurance via Latent Key/confirmation #/EOC [id number] Status is pending

## 2023-12-02 NOTE — Telephone Encounter (Signed)
 Pharmacy Patient Advocate Encounter  Received notification from [facility] MEDICARE that Prior Authorization for Cyclobenzaprine  HCl 5MG  tablets has been APPROVED from 12/02/23 to 03/01/24. Ran test claim, Copay is $1.11. This test claim was processed through [facility]- copay amounts may vary at other pharmacies due to pharmacy/plan contracts, or as the patient moves through the different stages of their insurance plan.   PA #/Case ID/Reference #: [id number]

## 2023-12-02 NOTE — Telephone Encounter (Signed)
 PA request has been Submitted. New Encounter has been or will be created for follow up. For additional info see Pharmacy Prior Auth telephone encounter from 12/02/23.

## 2023-12-05 LAB — CALCIUM, URINE, 24 HOUR: Calcium, 24H Urine: 123 mg/(24.h)

## 2023-12-06 ENCOUNTER — Other Ambulatory Visit: Payer: Self-pay | Admitting: Family Medicine

## 2023-12-06 DIAGNOSIS — N390 Urinary tract infection, site not specified: Secondary | ICD-10-CM

## 2023-12-06 MED ORDER — NITROFURANTOIN MONOHYD MACRO 100 MG PO CAPS: 100.0000 mg | ORAL_CAPSULE | Freq: Two times a day (BID) | ORAL | 0 refills | Status: AC

## 2023-12-07 LAB — CBC WITH DIFFERENTIAL/PLATELET
Absolute Lymphocytes: 1911 {cells}/uL (ref 850–3900)
Absolute Monocytes: 862 {cells}/uL (ref 200–950)
Basophils Absolute: 49 {cells}/uL (ref 0–200)
Basophils Relative: 0.5 %
Eosinophils Absolute: 186 {cells}/uL (ref 15–500)
Eosinophils Relative: 1.9 %
HCT: 43.7 % (ref 35.0–45.0)
Hemoglobin: 14.4 g/dL (ref 11.7–15.5)
MCH: 29.2 pg (ref 27.0–33.0)
MCHC: 33 g/dL (ref 32.0–36.0)
MCV: 88.6 fL (ref 80.0–100.0)
MPV: 9.2 fL (ref 7.5–12.5)
Monocytes Relative: 8.8 %
Neutro Abs: 6791 {cells}/uL (ref 1500–7800)
Neutrophils Relative %: 69.3 %
Platelets: 332 Thousand/uL (ref 140–400)
RBC: 4.93 Million/uL (ref 3.80–5.10)
RDW: 13.6 % (ref 11.0–15.0)
Total Lymphocyte: 19.5 %
WBC: 9.8 Thousand/uL (ref 3.8–10.8)

## 2023-12-07 LAB — URINE CULTURE
MICRO NUMBER:: 17225811
SPECIMEN QUALITY:: ADEQUATE

## 2023-12-20 ENCOUNTER — Telehealth: Payer: Self-pay | Admitting: Family Medicine

## 2024-05-27 ENCOUNTER — Ambulatory Visit: Admitting: Family Medicine

## 2024-11-30 ENCOUNTER — Encounter: Admitting: Family Medicine
